# Patient Record
Sex: Female | Born: 1980
Health system: Northeastern US, Community
[De-identification: ages and names within clinical notes are randomized; demographics above are authoritative.]

## PROBLEM LIST (undated history)

## (undated) DIAGNOSIS — R7611 Nonspecific reaction to tuberculin skin test without active tuberculosis: Secondary | ICD-10-CM

## (undated) DIAGNOSIS — F4323 Adjustment disorder with mixed anxiety and depressed mood: Secondary | ICD-10-CM

## (undated) DIAGNOSIS — E669 Obesity, unspecified: Secondary | ICD-10-CM

## (undated) HISTORY — PX: MASTOPEXY: REP19

## (undated) HISTORY — DX: Generalized anxiety disorder: F41.1

## (undated) HISTORY — PX: EXCISION SKIN ABD INFRAUMBILICAL PANNICULECTOMY: PRO028

## (undated) HISTORY — DX: Nonspecific reaction to tuberculin skin test without active tuberculosis: R76.11

## (undated) HISTORY — PX: OB ANTEPARTUM CARE CESAREAN DLVR & POSTPARTUM: REP299

## (undated) HISTORY — PX: TUBAL LIGATION: SHX77

---

## 2006-04-12 ENCOUNTER — Emergency Department (HOSPITAL_BASED_OUTPATIENT_CLINIC_OR_DEPARTMENT_OTHER): Payer: Self-pay | Admitting: Emergency Medicine

## 2006-04-12 LAB — BLOOD COUNT COMPLETE AUTO&AUTO DIFRNTL WBC
BASOPHIL %: 0.5 % (ref 0.0–2.0)
EOSINOPHIL %: 2 % (ref 0.0–7.0)
HEMATOCRIT: 38.7 % (ref 36.0–48.0)
HEMOGLOBIN: 13.1 g/dl (ref 12.0–16.0)
LYMPHOCYTE %: 18.5 % (ref 13.0–39.0)
MEAN CORP HGB CONC: 33.8 g/dl (ref 32.0–36.0)
MEAN CORPUSCULAR HGB: 30.9 pg (ref 27.0–33.0)
MEAN CORPUSCULAR VOL: 91.4 fl (ref 80.0–100.0)
MEAN PLATELET VOLUME: 8.9 fl (ref 6.4–10.8)
MONOCYTE %: 4.8 % (ref 1.0–12.0)
NEUTROPHIL %: 74.2 % (ref 46.0–79.0)
PLATELET COUNT: 247 10*3/uL (ref 150–400)
RBC DISTRIBUTION WIDTH: 11.9 % (ref 11.5–14.3)
RED BLOOD CELL COUNT: 4.23 M/uL — ABNORMAL LOW (ref 4.50–5.10)
WHITE BLOOD CELL COUNT: 10.2 10*3/uL (ref 4.0–10.8)

## 2006-04-12 LAB — PATIENT ABO/RH CONFIRM

## 2006-04-12 LAB — US PREG UTERUS REAL TIME W/IMAGE DCMTN TRANSVAG

## 2006-04-12 LAB — HCG QUALITATIVE SERUM: HCG QUALITATIVE SERUM: POSITIVE — AB

## 2006-04-12 LAB — US OB 1ST TRIMESTER

## 2006-04-12 LAB — TYPE AND SCREEN

## 2006-04-16 LAB — EMERGENCY ROOM NOTE

## 2006-04-20 ENCOUNTER — Encounter (HOSPITAL_BASED_OUTPATIENT_CLINIC_OR_DEPARTMENT_OTHER): Payer: Self-pay | Admitting: Obstetrics & Gynecology

## 2006-04-20 ENCOUNTER — Ambulatory Visit (HOSPITAL_BASED_OUTPATIENT_CLINIC_OR_DEPARTMENT_OTHER): Payer: Medicaid Other | Admitting: Obstetrics & Gynecology

## 2006-04-20 VITALS — BP 110/70 | Wt 163.5 lb

## 2006-04-20 DIAGNOSIS — O039 Complete or unspecified spontaneous abortion without complication: Secondary | ICD-10-CM

## 2006-04-20 DIAGNOSIS — Z23 Encounter for immunization: Secondary | ICD-10-CM

## 2006-04-20 LAB — URINE DIP (POINT OF CARE)
BILIRUBIN, URINE: NEGATIVE mg/dl (ref 0–0)
GLUCOSE, URINE: NEGATIVE mg/dl (ref 0–0)
KETONE, URINE: NEGATIVE mg/dl (ref 0–0)
LEUKOCYTE ESTERASE: NEGATIVE Leu/mcl (ref 0–0)
NITRITE, URINE: NEGATIVE
OCCULT BLOOD, URINE: NEGATIVE mg/dl (ref 0–0)
PH URINE: 5.5 (ref 5.0–8.0)
PROTEIN, URINE: 15 mg/dl (ref 0–15)
SPECIFIC GRAVITY URINE: 1.03 (ref 1.003–1.030)
UROBILINOGEN URINE: NEGATIVE mg/dl (ref 0.2–1.0)

## 2006-04-20 NOTE — Progress Notes (Signed)
S/ referred from the ER after presenting with bleeding in early pregnancy and diagnosed with an IUP. No further bleding No C/O. Wants a BTL with this C/S. (H/O of 2 prior C/Ss.)       No Known Allergies.  No current outpatient prescriptions on file prior to 04/20/06.    Obstetric History   G3 P2 T2 P0 TAB0 SAB0 E0 M0 L2     Past Medical History:   ANXIETY STATE NEC    ASTHMA    Comment: peviously used a vaporizer  Past Surgical History:   CESAREAN DELIVERY    Comment: times two, Wants BTL with next C/S  Social History   Marital Status: Married Spouse Name:    Years of Education: Number of children:     Occupational History   None on file    Social History Main Topics   Tobacco Use: Never    Alcohol Use: No    Drug Use: No    Sexual Activity: Yes Partners with: Female   Comment: H/O abnormal pap after her last C/S, S/P    cauterization, normal since then. No H/O   STDs.    Other Topics Concern   None on file    Social History Narrative   None on file      Review of patient's family history indicates:   Diabetes Mother    Hypertension Mother     O/ FH heard by doptone    A/ IUP    P/ F/U for first prenatal visit.

## 2006-05-05 ENCOUNTER — Other Ambulatory Visit (HOSPITAL_BASED_OUTPATIENT_CLINIC_OR_DEPARTMENT_OTHER): Payer: Self-pay

## 2006-05-05 NOTE — Telephone Encounter (Signed)
Thank you for your referral to the [facility]. We will be contacting your patient within the next 24 hours to assist them in making an appointment. You will be informed of the outcome via your Epic InBasket.

## 2006-05-09 NOTE — Telephone Encounter (Signed)
Patient has appointmen scheduled with Dr. Neva Seat for March 28th.

## 2006-05-15 ENCOUNTER — Encounter (HOSPITAL_BASED_OUTPATIENT_CLINIC_OR_DEPARTMENT_OTHER): Payer: Self-pay | Admitting: Obstetrics & Gynecology

## 2006-05-15 ENCOUNTER — Ambulatory Visit (HOSPITAL_BASED_OUTPATIENT_CLINIC_OR_DEPARTMENT_OTHER): Payer: PRIVATE HEALTH INSURANCE | Admitting: Obstetrics & Gynecology

## 2006-05-15 VITALS — BP 142/90 | HR 92 | Wt 167.0 lb

## 2006-05-15 DIAGNOSIS — Z3480 Encounter for supervision of other normal pregnancy, unspecified trimester: Secondary | ICD-10-CM

## 2006-05-15 DIAGNOSIS — Z111 Encounter for screening for respiratory tuberculosis: Secondary | ICD-10-CM

## 2006-05-15 LAB — URINE DIP (POINT OF CARE)
BILIRUBIN, URINE: NEGATIVE mg/dl (ref 0–0)
KETONE, URINE: NEGATIVE mg/dl (ref 0–0)
LEUKOCYTE ESTERASE: NEGATIVE Leu/mcl (ref 0–0)
NITRITE, URINE: NEGATIVE
OCCULT BLOOD, URINE: NEGATIVE mg/dl (ref 0–0)
PH URINE: 6.5 (ref 5.0–8.0)
PROTEIN, URINE: NEGATIVE mg/dl (ref 0–15)
SPECIFIC GRAVITY URINE: 1.02 (ref 1.003–1.030)

## 2006-05-16 LAB — URINALYSIS DIPSTICK
BILIRUBIN, URINE: NEGATIVE
GLUCOSE, URINE: NEGATIVE MG/DL
KETONE, URINE: NEGATIVE MG/DL
LEUKOCYTE ESTERASE: NEGATIVE
NITRITE, URINE: NEGATIVE
OCCULT BLOOD, URINE: NEGATIVE
PH URINE: 5.5 (ref 5.0–8.0)
PROTEIN, URINE: NEGATIVE MG/DL
SPECIFIC GRAVITY URINE: 1.023 (ref 1.003–1.035)

## 2006-05-17 ENCOUNTER — Encounter (HOSPITAL_BASED_OUTPATIENT_CLINIC_OR_DEPARTMENT_OTHER): Payer: Self-pay | Admitting: Obstetrics & Gynecology

## 2006-05-17 DIAGNOSIS — R7611 Nonspecific reaction to tuberculin skin test without active tuberculosis: Secondary | ICD-10-CM

## 2006-05-17 HISTORY — DX: Nonspecific reaction to tuberculin skin test without active tuberculosis: R76.11

## 2006-05-17 LAB — SKIN TEST TUBERCULOSIS INTRADERMAL

## 2006-05-18 LAB — HGB ELECTROPHORESIS
HEMOGLOBIN A1A3: 95.3 % — ABNORMAL LOW (ref 96.0–99.5)
HEMOGLOBIN A2: 3.2 % (ref 2.0–3.5)
HEMOGLOBIN C: 0 % (ref 0–0)
HEMOGLOBIN F: 1.2 % (ref 0–2.0)
HEMOGLOBIN S: 0 % (ref 0–0)

## 2006-05-18 LAB — HEPATITIS C ANTIBODY: HEPATITIS C ANTIBODY: NEGATIVE

## 2006-05-18 LAB — RUBELLA IGG ANTIBODY: RUBELLA: 258 IU/mL — ABNORMAL HIGH (ref 0–15)

## 2006-05-18 LAB — TREPONEMA PALLIDUM AB IGG: TREPONEMA PALLIDUM AB IgG: NONREACTIVE

## 2006-05-18 LAB — CHLAMYDIA GC NAAT
GENPROBE CHLAMYDIA: NEGATIVE
GENPROBE GC: NEGATIVE

## 2006-05-18 LAB — VARICELLA ZOSTER IGG ANTIBODY: VARICELLA ZOSTER IGG ANTIBODY: 2.5 — ABNORMAL HIGH (ref 0.0–0.8)

## 2006-05-18 LAB — THYROID SCREEN TSH REFLEX FT4: THYROID SCREEN TSH REFLEX FT4: 0.75 u[IU]/mL (ref 0.34–5.60)

## 2006-05-18 LAB — HIV 1 AND 2 PLUS O ANTIBODY: HIV 1 AND 2 PLUS O SCREEN: NONREACTIVE

## 2006-05-18 LAB — HEPATITIS B SURFACE ANTIGEN: HEPATITIS B SURFACE ANTIGEN: NONREACTIVE

## 2006-05-22 LAB — CYSTIC FIBROSIS DNA ANALYSIS: CYSTIC FIBROSIS DNA ANALYSIS: NEGATIVE

## 2006-05-26 ENCOUNTER — Ambulatory Visit (HOSPITAL_BASED_OUTPATIENT_CLINIC_OR_DEPARTMENT_OTHER): Payer: PRIVATE HEALTH INSURANCE

## 2006-05-26 DIAGNOSIS — F4323 Adjustment disorder with mixed anxiety and depressed mood: Secondary | ICD-10-CM

## 2006-05-26 HISTORY — DX: Adjustment disorder with mixed anxiety and depressed mood: F43.23

## 2006-05-26 LAB — CYTOPATH, C/V, THIN LAYER

## 2006-05-26 MED ORDER — AMITRIPTYLINE HCL 25 MG PO TABS
ORAL_TABLET | ORAL | Status: DC
Start: 2006-05-26 — End: 2006-08-04
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
## 2006-05-31 ENCOUNTER — Encounter (HOSPITAL_BASED_OUTPATIENT_CLINIC_OR_DEPARTMENT_OTHER): Payer: PRIVATE HEALTH INSURANCE | Admitting: Obstetrics & Gynecology

## 2006-06-05 ENCOUNTER — Ambulatory Visit (HOSPITAL_BASED_OUTPATIENT_CLINIC_OR_DEPARTMENT_OTHER): Payer: Self-pay | Admitting: Obstetrics & Gynecology

## 2006-06-05 DIAGNOSIS — Z3480 Encounter for supervision of other normal pregnancy, unspecified trimester: Secondary | ICD-10-CM

## 2006-06-20 ENCOUNTER — Emergency Department (HOSPITAL_BASED_OUTPATIENT_CLINIC_OR_DEPARTMENT_OTHER): Payer: Self-pay | Admitting: Emergency Medicine

## 2006-06-21 LAB — COMPREHENSIVE METABOLIC PANEL
ALANINE AMINOTRANSFERASE: 11 IU/L (ref 7–35)
ALBUMIN: 2.8 g/dl — ABNORMAL LOW (ref 3.4–4.8)
ALKALINE PHOSPHATASE: 48 IU/L (ref 25–106)
ANION GAP: 5 mmol/L (ref 2–25)
ASPARTATE AMINOTRANSFERASE: 16 IU/L (ref 8–34)
BILIRUBIN TOTAL: 0.4 mg/dl (ref 0.2–1.1)
BUN (UREA NITROGEN): 3 mg/dl — ABNORMAL LOW (ref 6–20)
CALCIUM: 8.3 mg/dl — ABNORMAL LOW (ref 8.6–10.0)
CARBON DIOXIDE: 25 mmol/L (ref 22–32)
CHLORIDE: 108 mmol/L (ref 101–111)
CREATININE: 0.6 mg/dl (ref 0.4–1.2)
Glucose Random: 110 mg/dl (ref 74–160)
POTASSIUM: 3.8 mmol/L (ref 3.5–5.1)
SODIUM: 138 mmol/L (ref 135–144)
TOTAL PROTEIN: 5.7 g/dl — ABNORMAL LOW (ref 5.9–7.5)

## 2006-06-21 LAB — BLOOD COUNT COMPLETE AUTO&AUTO DIFRNTL WBC
BASOPHIL %: 0.4 % (ref 0.0–2.0)
EOSINOPHIL %: 1.4 % (ref 0.0–7.0)
HEMATOCRIT: 30.7 % — ABNORMAL LOW (ref 36.0–48.0)
HEMOGLOBIN: 10.8 g/dl — ABNORMAL LOW (ref 12.0–16.0)
LYMPHOCYTE %: 14.1 % (ref 13.0–39.0)
MEAN CORP HGB CONC: 35.1 g/dl (ref 32.0–36.0)
MEAN CORPUSCULAR HGB: 31 pg (ref 27.0–33.0)
MEAN CORPUSCULAR VOL: 88.3 fl (ref 80.0–100.0)
MEAN PLATELET VOLUME: 9 fl (ref 6.4–10.8)
MONOCYTE %: 4.6 % (ref 1.0–12.0)
NEUTROPHIL %: 79.5 % — ABNORMAL HIGH (ref 46.0–79.0)
PLATELET COUNT: 218 10*3/uL (ref 150–400)
RBC DISTRIBUTION WIDTH: 11.8 % (ref 11.5–14.3)
RED BLOOD CELL COUNT: 3.48 M/uL — ABNORMAL LOW (ref 4.50–5.10)
WHITE BLOOD CELL COUNT: 11.8 10*3/uL — ABNORMAL HIGH (ref 4.0–10.8)

## 2006-06-21 LAB — HOLD GREEN TOP TUBE

## 2006-06-21 LAB — EMERGENCY ROOM NOTE

## 2006-06-21 LAB — HOLD BLUE TOP TUBE

## 2006-06-23 ENCOUNTER — Encounter (HOSPITAL_BASED_OUTPATIENT_CLINIC_OR_DEPARTMENT_OTHER): Payer: Medicaid Other

## 2006-06-26 ENCOUNTER — Encounter (HOSPITAL_BASED_OUTPATIENT_CLINIC_OR_DEPARTMENT_OTHER): Payer: Self-pay | Admitting: "Women's Health Care

## 2006-06-26 NOTE — Progress Notes (Signed)
Pt walk in s/p er visit for cough  and sob on 4/22. Pt c/o SOB again.  Pt sent to ER for evaluation. Pt given phone Number for PCP, encouragedpt to schedule an appointment.   Appt scheduled to see Dr Mindi Junker this WED.  GA 20+ wks, pt missed last prenatal appt.

## 2006-06-28 ENCOUNTER — Encounter (HOSPITAL_BASED_OUTPATIENT_CLINIC_OR_DEPARTMENT_OTHER): Payer: PRIVATE HEALTH INSURANCE | Admitting: Obstetrics & Gynecology

## 2006-07-05 ENCOUNTER — Encounter (HOSPITAL_BASED_OUTPATIENT_CLINIC_OR_DEPARTMENT_OTHER): Payer: Medicaid Other | Admitting: Registered"

## 2006-07-06 ENCOUNTER — Encounter (HOSPITAL_BASED_OUTPATIENT_CLINIC_OR_DEPARTMENT_OTHER): Payer: Medicaid Other | Admitting: Geriatric Medicine

## 2006-07-10 ENCOUNTER — Ambulatory Visit (HOSPITAL_BASED_OUTPATIENT_CLINIC_OR_DEPARTMENT_OTHER): Payer: Self-pay | Admitting: Obstetrics & Gynecology

## 2006-07-10 ENCOUNTER — Encounter (HOSPITAL_BASED_OUTPATIENT_CLINIC_OR_DEPARTMENT_OTHER): Payer: PRIVATE HEALTH INSURANCE

## 2006-07-10 DIAGNOSIS — R296 Repeated falls: Secondary | ICD-10-CM

## 2006-07-10 DIAGNOSIS — O99891 Other specified diseases and conditions complicating pregnancy: Secondary | ICD-10-CM

## 2006-07-10 DIAGNOSIS — O9989 Other specified diseases and conditions complicating pregnancy, childbirth and the puerperium: Principal | ICD-10-CM

## 2006-07-10 LAB — BLOOD COUNT COMPLETE AUTOMATED
HEMATOCRIT: 33.7 % — ABNORMAL LOW (ref 36.0–48.0)
HEMOGLOBIN: 11.3 g/dl — ABNORMAL LOW (ref 12.0–16.0)
MEAN CORP HGB CONC: 33.6 g/dl (ref 32.0–36.0)
MEAN CORPUSCULAR HGB: 30.9 pg (ref 27.0–33.0)
MEAN CORPUSCULAR VOL: 92 fl (ref 80.0–100.0)
MEAN PLATELET VOLUME: 9.2 fl (ref 6.4–10.8)
PLATELET COUNT: 224 10*3/uL (ref 150–400)
RBC DISTRIBUTION WIDTH: 12.7 % (ref 11.5–14.3)
RED BLOOD CELL COUNT: 3.66 M/uL — ABNORMAL LOW (ref 4.50–5.10)
WHITE BLOOD CELL COUNT: 11 10*3/uL — ABNORMAL HIGH (ref 4.0–10.8)

## 2006-07-10 LAB — CHG CREATININE BLOOD: CREATININE: 0.5 mg/dl (ref 0.4–1.2)

## 2006-07-10 LAB — CHG ASSAY OF BLOOD/URIC ACID: URIC ACID: 3.3 mg/dl (ref 2.6–8.0)

## 2006-07-10 LAB — TRANSFERASE ASPARTATE AMINO AST SGOT: ASPARTATE AMINOTRANSFERASE: 15 IU/L (ref 8–34)

## 2006-07-10 LAB — TRANSFERASE ALANINE AMINO ALT SGPT: ALANINE AMINOTRANSFERASE: 11 IU/L (ref 7–35)

## 2006-07-10 LAB — ASSAY OF UREA NITROGEN QUANTITATIVE: BUN (UREA NITROGEN): 6 mg/dl (ref 6–20)

## 2006-07-12 LAB — US OB 2ND OR 3RD TRIMESTER SURVEY

## 2006-07-20 ENCOUNTER — Encounter (HOSPITAL_BASED_OUTPATIENT_CLINIC_OR_DEPARTMENT_OTHER): Payer: Self-pay | Admitting: Obstetrics & Gynecology

## 2006-07-20 ENCOUNTER — Ambulatory Visit (HOSPITAL_BASED_OUTPATIENT_CLINIC_OR_DEPARTMENT_OTHER): Payer: PRIVATE HEALTH INSURANCE | Admitting: Obstetrics & Gynecology

## 2006-07-20 VITALS — BP 124/66 | Wt 175.0 lb

## 2006-07-20 DIAGNOSIS — Z8759 Personal history of other complications of pregnancy, childbirth and the puerperium: Secondary | ICD-10-CM | POA: Insufficient documentation

## 2006-07-20 DIAGNOSIS — Z3480 Encounter for supervision of other normal pregnancy, unspecified trimester: Secondary | ICD-10-CM

## 2006-07-20 LAB — URINE DIP (POINT OF CARE)
BILIRUBIN, URINE: NEGATIVE mg/dl (ref 0–0)
GLUCOSE, URINE: NORMAL mg/dl (ref 0–0)
KETONE, URINE: NEGATIVE mg/dl (ref 0–0)
LEUKOCYTE ESTERASE: NEGATIVE Leu/mcl (ref 0–0)
NITRITE, URINE: NEGATIVE
OCCULT BLOOD, URINE: NEGATIVE mg/dl (ref 0–0)
PH URINE: 6 (ref 5.0–8.0)
PROTEIN, URINE: NEGATIVE mg/dl (ref 0–15)
SPECIFIC GRAVITY URINE: 1.02 (ref 1.003–1.030)
UROBILINOGEN URINE: NORMAL mg/dl (ref 0.2–1.0)

## 2006-07-20 MED ORDER — SALINE NASAL SPRAY 0.65 % NA SOLN
NASAL | Status: DC
Start: 2006-07-20 — End: 2006-10-12

## 2006-07-25 ENCOUNTER — Ambulatory Visit (HOSPITAL_BASED_OUTPATIENT_CLINIC_OR_DEPARTMENT_OTHER): Payer: PRIVATE HEALTH INSURANCE

## 2006-07-25 ENCOUNTER — Encounter (HOSPITAL_BASED_OUTPATIENT_CLINIC_OR_DEPARTMENT_OTHER): Payer: Self-pay

## 2006-07-25 DIAGNOSIS — Z7189 Other specified counseling: Secondary | ICD-10-CM

## 2006-07-25 NOTE — Progress Notes (Signed)
Kimberly Lindsey is a 26 year old female, G2 P2. Non smoker patient , happily married, denies any type of violence.    Patient here today requesting counseling on sterilization. Tubal ligation counseling was done, information was given and consent signed.    Patient is currently [redacted] weeks pregnant, due for delivery by C-Section on 10/2006 and would like to have surgery on the same day of delivery.    Denies any other problems.

## 2006-08-01 ENCOUNTER — Ambulatory Visit (HOSPITAL_BASED_OUTPATIENT_CLINIC_OR_DEPARTMENT_OTHER): Payer: Medicaid Other | Admitting: Neurology

## 2006-08-01 ENCOUNTER — Ambulatory Visit (HOSPITAL_BASED_OUTPATIENT_CLINIC_OR_DEPARTMENT_OTHER): Payer: Medicaid Other

## 2006-08-01 VITALS — BP 118/82

## 2006-08-01 DIAGNOSIS — Z3480 Encounter for supervision of other normal pregnancy, unspecified trimester: Secondary | ICD-10-CM

## 2006-08-01 NOTE — Progress Notes (Signed)
Due to the language barrier, the office visit was conducted in [place] with an interpreter. The interpreter's name is Rosanna. The interpreter was on the phone during the entire visit.  Walked in with concerns about episode that occurred two hours ago. 1:30 felt c/o fatigue, weakness with numbness around her mouth, while talking with friends at hairdressers and needed asked for help sitting down. Sensation was fleeting and not present now. Had a glass of water and put her feet up and felt better right away. She called her husband to bring her to her appt. at 4:20 and came here first.    BP 118/82 Skin warm, dry and color pink. Denied racing heart beat. Fetus moving well, pt. denies contractions, leaking or vaginal discharge. Stated that she feels fine now.    Pt. activity today, normal, walked about 20 minutes in the morning.  12 noon ate beans,rice, meat & vegetables; Drank at least two bottles of water.    Pt. has an appt. today with Neuro for headaches. Pt. has a consult with Dr. Matilde Haymaker (Cardiology) on 06/10 for history in [place] of racing heart sensation.    Report to Dr. Neil Crouch. Pt. to keep her appt. with Neuro and instructed to call or return to the hospital for any further return of symptoms.

## 2006-08-04 ENCOUNTER — Ambulatory Visit (HOSPITAL_BASED_OUTPATIENT_CLINIC_OR_DEPARTMENT_OTHER): Payer: PRIVATE HEALTH INSURANCE

## 2006-08-04 DIAGNOSIS — F4323 Adjustment disorder with mixed anxiety and depressed mood: Secondary | ICD-10-CM

## 2006-08-04 MED ORDER — AMITRIPTYLINE HCL 25 MG PO TABS
ORAL_TABLET | ORAL | Status: AC
Start: 2006-08-04 — End: 2006-11-02

## 2006-08-04 NOTE — Progress Notes (Signed)
PSYCHIATRY OUTPATIENT PROGRESS NOTE    VISIT TYPE: Psychopharmacology      INTERPRETER: Synetta Fail (name) via phone interpreter in [place].    PROBLEMS which this visit addressed:   Problem 1: depressive sx     SOURCE(S) OF INFORMATION: Patient and pt's husband     SUBJECTIVE FINDINGS:     Kimberly Lindsey presents for f/u today with her husband; now 6 months pregnant. Since starting amitriptyline, sleep and mood have improved. She feels much calmer, and is not crying frequently. She denies any side effects from the medication.       OBJECTIVE FINDINGS:   Pertinent positive and negative parts of mental status exam:     - casually dressed [place] F with consistent eye contact  - no abnl movements  - speech nl rate and tone  - TP linear, logical  - TC no evidence of delusions; no IOR, no obsessions/compulsions  - mood "more tranquil, not so depressed"  - affect reactive, congruent  - no A/VH  - no SI no HI; future oriented  - insight/judgement- fair/good  - cognition- grossly intact     Signs and symptoms: as above     Current medications (n/a for psychotherapy only visits):   Current outpatient prescriptions:  AMITRIPTYLINE HCL 25 MG OR TABS, 1 TABLET AT BEDTIME,   SALINE NASAL SPRAY 0.65 % NA SOLN, Q 3 hours nasally PRN, Disp: one, Rfl: 3  PRENATAL VITAMINS (DIS) OR TABS, one a day, Disp: , Rfl: 0    Medications taken as prescribed (n/a for psychotherapy only visits): Yes    Medication side effects - including movement disorders/AIMS score (n/a for psychotherapy only visits): None reported.     Testing results: No test results pending.     Risk behaviors: None reported.       ASSESSMENT:  Clinical formulation: 26 yo remarried [place] mother of two, now pregnant w/her third child referred by Dr. Raylene Miyamoto in the setting of depressive sx. Pt has numerous psychosocial stressors, and limited social supports. She has a h/o depressive sx; and has responded well to medication tx. We have recommended both supportive therapy and  medication mgt, however Ms. Capp prefers medication tx alone at this time. Discussed possible therapeutic benefits of TCA trial to address insomnia and depressive sx. Safety profile in pregnancy discussed.           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 Amitriptyline 25mg  po qhs; discussed safety profile in pregnancy and lactation (pt handout provided)    - pt informed that I will be on maternity leave for three months, starting August; prescriber coverage through [facility] will be provided    - contact w/Dr. Raylene Miyamoto to coordinate care     Risk plan (for patients at moderate/high risk for suicide/violence/addiction): Patient not at moderate or high risk.    Next visit: patient to be seen as needed  FOR PSYCHOPHARMACOLOGY VISITS ONLY  Pregnancy status: as above    Medication plan:   Continue current medications without change     Medication education: as above    Medical work-up plan/testing: None indicated.    Instructions to covering prescriber: OK to re-fill if patient needs meds     Amount of time spent w/patient today: 20"     Ed Blalock

## 2006-08-07 ENCOUNTER — Encounter (HOSPITAL_BASED_OUTPATIENT_CLINIC_OR_DEPARTMENT_OTHER): Payer: Self-pay | Admitting: "Women's Health Care

## 2006-08-07 DIAGNOSIS — R7611 Nonspecific reaction to tuberculin skin test without active tuberculosis: Secondary | ICD-10-CM | POA: Insufficient documentation

## 2006-08-07 LAB — MED SPEC CLINIC NOTE

## 2006-08-10 ENCOUNTER — Ambulatory Visit (HOSPITAL_BASED_OUTPATIENT_CLINIC_OR_DEPARTMENT_OTHER): Payer: Self-pay | Admitting: Obstetrics & Gynecology

## 2006-08-10 DIAGNOSIS — R1084 Generalized abdominal pain: Secondary | ICD-10-CM

## 2006-08-10 LAB — URINALYSIS
BILIRUBIN, URINE: NEGATIVE
GLUCOSE, URINE: NEGATIVE MG/DL
KETONE, URINE: NEGATIVE MG/DL
LEUKOCYTE ESTERASE: NEGATIVE
NITRITE, URINE: NEGATIVE
OCCULT BLOOD, URINE: NEGATIVE
PH URINE: 6.5 (ref 5.0–8.0)
PROTEIN, URINE: NEGATIVE MG/DL
SPECIFIC GRAVITY URINE: 1.02 (ref 1.003–1.035)

## 2006-08-10 LAB — FETAL FIBRONECTIN: FETAL FIBRONECTIN: NEGATIVE

## 2006-08-15 LAB — URINE CULTURE/COLONY COUNT

## 2006-08-17 ENCOUNTER — Telehealth (HOSPITAL_BASED_OUTPATIENT_CLINIC_OR_DEPARTMENT_OTHER): Payer: Self-pay | Admitting: Obstetrics & Gynecology

## 2006-08-17 ENCOUNTER — Ambulatory Visit (HOSPITAL_BASED_OUTPATIENT_CLINIC_OR_DEPARTMENT_OTHER): Payer: PRIVATE HEALTH INSURANCE | Admitting: Obstetrics & Gynecology

## 2006-08-17 NOTE — Telephone Encounter (Signed)
Telephone call to pt message left,  please call back [facility] at [phone].

## 2006-08-17 NOTE — Telephone Encounter (Signed)
This patient [facility] for her 28 week prenatal appointment. Please track her down. Thanks.

## 2006-08-18 NOTE — Telephone Encounter (Signed)
AM

## 2006-09-01 NOTE — Telephone Encounter (Signed)
Please continue to call this patient every day until you reach her.

## 2006-09-04 NOTE — Telephone Encounter (Signed)
All phone # out of service. Cert letter sent

## 2006-09-05 ENCOUNTER — Encounter (HOSPITAL_BASED_OUTPATIENT_CLINIC_OR_DEPARTMENT_OTHER): Payer: Self-pay | Admitting: Obstetrics & Gynecology

## 2006-09-11 ENCOUNTER — Ambulatory Visit (HOSPITAL_BASED_OUTPATIENT_CLINIC_OR_DEPARTMENT_OTHER): Payer: PRIVATE HEALTH INSURANCE | Admitting: Obstetrics & Gynecology

## 2006-09-11 VITALS — BP 128/72 | Wt 178.0 lb

## 2006-09-11 DIAGNOSIS — Z3480 Encounter for supervision of other normal pregnancy, unspecified trimester: Secondary | ICD-10-CM

## 2006-09-11 LAB — BLOOD COUNT COMPLETE AUTOMATED
HEMATOCRIT: 32 % — ABNORMAL LOW (ref 36.0–48.0)
HEMOGLOBIN: 10.7 g/dl — ABNORMAL LOW (ref 12.0–16.0)
MEAN CORP HGB CONC: 33.4 g/dl (ref 32.0–36.0)
MEAN CORPUSCULAR HGB: 29.8 pg (ref 27.0–33.0)
MEAN CORPUSCULAR VOL: 89.2 fl (ref 80.0–100.0)
MEAN PLATELET VOLUME: 9.6 fl (ref 6.4–10.8)
PLATELET COUNT: 203 10*3/uL (ref 150–400)
RBC DISTRIBUTION WIDTH: 13.1 % (ref 11.5–14.3)
RED BLOOD CELL COUNT: 3.59 M/uL — ABNORMAL LOW (ref 4.50–5.10)
WHITE BLOOD CELL COUNT: 12.4 10*3/uL — ABNORMAL HIGH (ref 4.0–10.8)

## 2006-09-11 LAB — URINE DIP (POINT OF CARE)
BILIRUBIN, URINE: NEGATIVE mg/dl (ref 0–0)
GLUCOSE, URINE: NEGATIVE mg/dl (ref 0–0)
KETONE, URINE: NEGATIVE mg/dl (ref 0–0)
NITRITE, URINE: NEGATIVE
PH URINE: 6.5 (ref 5.0–8.0)
PROTEIN, URINE: NEGATIVE mg/dl (ref 0–15)
SPECIFIC GRAVITY URINE: 1.025 (ref 1.003–1.030)
UROBILINOGEN URINE: 0.2 mg/dl (ref 0.2–1.0)

## 2006-09-11 LAB — GLUCOSE 1 HR POST 50 GRAM DOSE: GLUCOSE 1 HR POST 50 GRAM DOSE: 107 mg/dl (ref ?–139)

## 2006-09-11 NOTE — Progress Notes (Signed)
Pt. walked in with concerns about hands swelling and numbness. Pt. stated that she had been to L&D on the "5th" floor a month ago; 06/19 [facility] with Dr. Raylene Miyamoto on 08/17/06--phone number non-functioning. Regular and certified letters sent to her home--pt. was on [employer] helping her sister with a new baby.  Pt. complains of:   -pain level of 4 in perianal/vaginal "like the bone hurting." With right side lying and pillow between knees, the sensation decreased to a pain level of 2.   -rare uterine tightening (perhaps 3 times a week)--braxton hicks discussed.   -weekly headaches (appx. one a week) relieved by tylenol.  -Missed 28 week visit; 50 gm glucose challenge today + CBCP-pt. is O+ blood type  -Urine 1.025 S.G. and encouraged pt. to increase fluids.  Discussed with Dr. Ellwood Handler and pt. placed onto fetal monitor.

## 2006-09-11 NOTE — Progress Notes (Signed)
Reassuring NST  [email or web address]

## 2006-09-12 DIAGNOSIS — Z8759 Personal history of other complications of pregnancy, childbirth and the puerperium: Secondary | ICD-10-CM | POA: Insufficient documentation

## 2006-09-14 ENCOUNTER — Ambulatory Visit (HOSPITAL_BASED_OUTPATIENT_CLINIC_OR_DEPARTMENT_OTHER): Payer: PRIVATE HEALTH INSURANCE | Admitting: Obstetrics & Gynecology

## 2006-09-14 VITALS — BP 120/66 | Wt 180.0 lb

## 2006-09-14 DIAGNOSIS — Z3480 Encounter for supervision of other normal pregnancy, unspecified trimester: Secondary | ICD-10-CM

## 2006-09-14 LAB — URINE DIP (POINT OF CARE)
BILIRUBIN, URINE: NEGATIVE mg/dl (ref 0–0)
GLUCOSE, URINE: NEGATIVE mg/dl (ref 0–0)
KETONE, URINE: NEGATIVE mg/dl (ref 0–0)
NITRITE, URINE: NEGATIVE
OCCULT BLOOD, URINE: NEGATIVE mg/dl (ref 0–0)
PH URINE: 6.5 (ref 5.0–8.0)
PROTEIN, URINE: NEGATIVE mg/dl (ref 0–15)
SPECIFIC GRAVITY URINE: 1.02 (ref 1.003–1.030)
UROBILINOGEN URINE: 0.2 mg/dl (ref 0.2–1.0)

## 2006-09-14 LAB — PROTEIN 24 HOUR URINE
PROTEIN 24HR URINE MG/DL: 15 mg/dl
PROTEIN 24HR URINE: 143 mg/24HR — ABNORMAL HIGH (ref 50–100)
TOTAL VOLUME 24 HOUR URINE: 950 ml/24HR (ref 600–1600)

## 2006-09-14 LAB — XR CHEST 1 VIEW

## 2006-09-14 MED ORDER — ACETAMINOPHEN-CAFF-BUTALBITAL 325-40-50 MG OR TABS
ORAL_TABLET | ORAL | Status: AC
Start: 2006-09-14 — End: 2006-10-14

## 2006-09-14 MED ORDER — FERROUS SULFATE 325 (65 FE) MG PO TABS
ORAL_TABLET | ORAL | Status: AC
Start: 2006-09-14 — End: 2007-09-14

## 2006-09-14 MED ORDER — PRENATAL VITAMINS (DIS) PO TABS
ORAL_TABLET | ORAL | Status: AC
Start: 2006-09-14 — End: 2007-09-14

## 2006-09-14 NOTE — Progress Notes (Signed)
Due to the language barrier, the office visit was conducted in Portuguese with an interpreter. The interpreter's name is . The interpreter was present during the entire visit.

## 2006-09-28 ENCOUNTER — Ambulatory Visit (HOSPITAL_BASED_OUTPATIENT_CLINIC_OR_DEPARTMENT_OTHER): Payer: PRIVATE HEALTH INSURANCE | Admitting: Obstetrics & Gynecology

## 2006-09-28 VITALS — BP 100/74 | Wt 185.0 lb

## 2006-09-28 DIAGNOSIS — Z3480 Encounter for supervision of other normal pregnancy, unspecified trimester: Secondary | ICD-10-CM

## 2006-09-28 LAB — URINE DIP (POINT OF CARE)
BILIRUBIN, URINE: NEGATIVE (ref 0–0)
GLUCOSE, URINE: NEGATIVE mg/dl (ref 0–0)
KETONE, URINE: NEGATIVE mg/dl (ref 0–0)
LEUKOCYTE ESTERASE: NEGATIVE (ref 0–0)
NITRITE, URINE: NEGATIVE
OCCULT BLOOD, URINE: NEGATIVE (ref 0–0)
PH URINE: 6.5 (ref 5.0–8.0)
PROTEIN, URINE: NEGATIVE mg/dl (ref 0–15)
SPECIFIC GRAVITY URINE: 1.015 (ref 1.003–1.030)
UROBILINOGEN URINE: 0.2 mg/dl (ref 0.2–1.0)

## 2006-09-28 NOTE — Progress Notes (Signed)
Due to the language barrier, the office visit was conducted in Portuguese with an interpreter. The interpreter's name is . The interpreter was present during the entire visit.

## 2006-09-29 NOTE — Progress Notes (Addendum)
Agree with plan  [email or web address]

## 2006-10-12 ENCOUNTER — Ambulatory Visit (HOSPITAL_BASED_OUTPATIENT_CLINIC_OR_DEPARTMENT_OTHER): Payer: PRIVATE HEALTH INSURANCE | Admitting: Obstetrics & Gynecology

## 2006-10-12 VITALS — BP 110/70 | Wt 183.0 lb

## 2006-10-12 DIAGNOSIS — Z3480 Encounter for supervision of other normal pregnancy, unspecified trimester: Secondary | ICD-10-CM

## 2006-10-12 LAB — URINE DIP (POINT OF CARE)
BILIRUBIN, URINE: NEGATIVE (ref 0–0)
GLUCOSE, URINE: NEGATIVE mg/dl (ref 0–0)
KETONE, URINE: NEGATIVE mg/dl (ref 0–0)
NITRITE, URINE: NEGATIVE
PH URINE: 6.5 (ref 5.0–8.0)
PROTEIN, URINE: NEGATIVE mg/dl (ref 0–15)
SPECIFIC GRAVITY URINE: 1.02 (ref 1.003–1.030)
UROBILINOGEN URINE: 0.2 mg/dl (ref 0.2–1.0)

## 2006-10-12 LAB — BLOOD SUGAR FINGERSTICK (POINT OF CARE): FINGERSTICK GLUCOSE: 142 mg/dl (ref 74–160)

## 2006-10-15 LAB — GENITAL GROUP B STREP: GENITAL GROUP B STREP: NEGATIVE

## 2006-10-17 ENCOUNTER — Telehealth (HOSPITAL_BASED_OUTPATIENT_CLINIC_OR_DEPARTMENT_OTHER): Payer: Self-pay | Admitting: "Women's Health Care

## 2006-10-17 NOTE — Telephone Encounter (Signed)
Due to the language barrier, the phone call was conducted in [place] with an interpreter. The interpreter was on the phone during the entire phone call.  States she has back pain that goes to her belly 1x/hr.  C/S booked 9/4.  Denies leaking vaginal fluid and states good FM.  Discussed with Dr Ellwood Handler.  Pt instructed to lie on left side, drinl plenty of liguids and to call with 4 or more pains in 1hr, < FM, leaking of any vaginal fluid.  Pt has no further appts scheduled.   Appt scheduled for 8/21 at 3pm.

## 2006-10-17 NOTE — Telephone Encounter (Signed)
Staff Message copied by Fuller Plan on Tue Oct 17, 2006 12:01 PM  ------   Message from: Danella Sensing   Created: Tue Oct 17, 2006 11:54 AM    Blenda Nicely [id number], 26 year old, female, Telephone Information:  Home Phone [phone]  Work Phone [phone]      Cleotis Lema NUMBER: [phone]  Cell phone:   Other phone:    Available times:    Patient's language of care: [place]    Patient needs a [place] interpreter.    Patient's PCP: None    Person calling on behalf of patient: patient (self)    Calls today patient will have cs 11/02/06 patient is now having for the last 4 days cramping   With hard belly that is contraction patient known's because is her third baby

## 2006-10-19 ENCOUNTER — Ambulatory Visit (HOSPITAL_BASED_OUTPATIENT_CLINIC_OR_DEPARTMENT_OTHER): Payer: PRIVATE HEALTH INSURANCE | Admitting: Obstetrics & Gynecology

## 2006-10-20 ENCOUNTER — Ambulatory Visit (HOSPITAL_BASED_OUTPATIENT_CLINIC_OR_DEPARTMENT_OTHER): Payer: Self-pay | Admitting: Obstetrics & Gynecology

## 2006-10-25 ENCOUNTER — Ambulatory Visit (HOSPITAL_BASED_OUTPATIENT_CLINIC_OR_DEPARTMENT_OTHER): Payer: Self-pay

## 2006-10-26 ENCOUNTER — Ambulatory Visit (HOSPITAL_BASED_OUTPATIENT_CLINIC_OR_DEPARTMENT_OTHER): Payer: PRIVATE HEALTH INSURANCE | Admitting: Obstetrics & Gynecology

## 2006-10-26 VITALS — BP 120/84 | Wt 186.5 lb

## 2006-10-26 DIAGNOSIS — Z3480 Encounter for supervision of other normal pregnancy, unspecified trimester: Secondary | ICD-10-CM

## 2006-10-26 LAB — URINE DIP (POINT OF CARE)
BILIRUBIN, URINE: NEGATIVE (ref 0–0)
GLUCOSE, URINE: NEGATIVE mg/dl (ref 0–0)
LEUKOCYTE ESTERASE: NEGATIVE (ref 0–0)
NITRITE, URINE: NEGATIVE
PH URINE: 6.5 (ref 5.0–8.0)
PROTEIN, URINE: NEGATIVE mg/dl (ref 0–15)
SPECIFIC GRAVITY URINE: 1.025 (ref 1.003–1.030)
UROBILINOGEN URINE: 0.2 mg/dl (ref 0.2–1.0)

## 2006-10-26 NOTE — Progress Notes (Signed)
Due to the language barrier, the office visit was conducted in Portuguese with an interpreter. The interpreter's name is . The interpreter was present during the entire visit.

## 2006-11-01 ENCOUNTER — Ambulatory Visit: Payer: Self-pay

## 2006-11-01 DIAGNOSIS — R002 Palpitations: Secondary | ICD-10-CM

## 2006-11-01 LAB — BLOOD COUNT COMPLETE AUTOMATED
HEMATOCRIT: 35 % — ABNORMAL LOW (ref 36.0–48.0)
HEMOGLOBIN: 11.6 g/dl — ABNORMAL LOW (ref 12.0–16.0)
MEAN CORP HGB CONC: 33.1 g/dl (ref 32.0–36.0)
MEAN CORPUSCULAR HGB: 29.6 pg (ref 27.0–33.0)
MEAN CORPUSCULAR VOL: 89.5 fl (ref 80.0–100.0)
MEAN PLATELET VOLUME: 9.6 fl (ref 6.4–10.8)
PLATELET COUNT: 172 10*3/uL (ref 150–400)
RBC DISTRIBUTION WIDTH: 16.5 % — ABNORMAL HIGH (ref 11.5–14.3)
RED BLOOD CELL COUNT: 3.91 M/uL — ABNORMAL LOW (ref 4.50–5.10)
WHITE BLOOD CELL COUNT: 10.9 10*3/uL — ABNORMAL HIGH (ref 4.0–10.8)

## 2006-11-01 LAB — TYPE AND SCREEN

## 2006-11-02 ENCOUNTER — Inpatient Hospital Stay (HOSPITAL_BASED_OUTPATIENT_CLINIC_OR_DEPARTMENT_OTHER)
Admit: 2006-11-02 | Disposition: A | Discharge status: Home / Self Care | Payer: Self-pay | Source: Ambulatory Visit | Attending: Obstetrics & Gynecology | Admitting: Obstetrics & Gynecology

## 2006-11-02 LAB — OPERATIVE REPORT

## 2006-11-02 LAB — TREPONEMA PALLIDUM AB IGG: TREPONEMA PALLIDUM AB IgG: NONREACTIVE

## 2006-11-03 LAB — BLOOD COUNT COMPLETE AUTOMATED
HEMATOCRIT: 24.4 % — ABNORMAL LOW (ref 36.0–48.0)
HEMOGLOBIN: 8 g/dl — ABNORMAL LOW (ref 12.0–16.0)
MEAN CORP HGB CONC: 33 g/dl (ref 32.0–36.0)
MEAN CORPUSCULAR HGB: 29.6 pg (ref 27.0–33.0)
MEAN CORPUSCULAR VOL: 89.7 fl (ref 80.0–100.0)
MEAN PLATELET VOLUME: 10.1 fl (ref 6.4–10.8)
PLATELET COUNT: 136 10*3/uL — ABNORMAL LOW (ref 150–400)
RBC DISTRIBUTION WIDTH: 16.4 % — ABNORMAL HIGH (ref 11.5–14.3)
RED BLOOD CELL COUNT: 2.72 M/uL — CL (ref 4.50–5.10)
WHITE BLOOD CELL COUNT: 9.7 10*3/uL (ref 4.0–10.8)

## 2006-11-03 LAB — SURGICAL PATH SPECIMEN

## 2006-11-07 LAB — DISCHARGE SUMMARY

## 2006-11-07 LAB — ECHOCARDIOGRAM W/ DOPPLER

## 2006-11-13 ENCOUNTER — Encounter (HOSPITAL_BASED_OUTPATIENT_CLINIC_OR_DEPARTMENT_OTHER): Payer: Self-pay | Admitting: Obstetrics & Gynecology

## 2006-11-16 ENCOUNTER — Encounter (HOSPITAL_BASED_OUTPATIENT_CLINIC_OR_DEPARTMENT_OTHER): Payer: Self-pay | Admitting: Obstetrics & Gynecology

## 2006-11-16 ENCOUNTER — Ambulatory Visit (HOSPITAL_BASED_OUTPATIENT_CLINIC_OR_DEPARTMENT_OTHER): Payer: PRIVATE HEALTH INSURANCE | Admitting: Obstetrics & Gynecology

## 2006-11-16 MED ORDER — IBUPROFEN 800 MG PO TABS
ORAL_TABLET | ORAL | Status: AC
Start: 2006-11-16 — End: 2006-12-16

## 2006-11-16 NOTE — Progress Notes (Signed)
S/ using percocet. Has run out of ibuprofen. Has pelvic pain. No incisional pain.    O/ wound well healed    A/ doing well S/P C/S with BTL.    P/ rx for ibuprofen given. Referred to Tb clinic for positive PPD during pregnancy. F/U in 4 weeks for full PP check. Next pap due 3/09.

## 2006-12-14 ENCOUNTER — Ambulatory Visit (HOSPITAL_BASED_OUTPATIENT_CLINIC_OR_DEPARTMENT_OTHER): Payer: PRIVATE HEALTH INSURANCE | Admitting: Obstetrics & Gynecology

## 2006-12-19 ENCOUNTER — Ambulatory Visit (HOSPITAL_BASED_OUTPATIENT_CLINIC_OR_DEPARTMENT_OTHER): Payer: Self-pay | Admitting: Infectious Disease

## 2006-12-21 ENCOUNTER — Encounter (HOSPITAL_BASED_OUTPATIENT_CLINIC_OR_DEPARTMENT_OTHER): Payer: Self-pay | Admitting: Obstetrics & Gynecology

## 2009-04-16 ENCOUNTER — Telehealth (HOSPITAL_BASED_OUTPATIENT_CLINIC_OR_DEPARTMENT_OTHER): Payer: Self-pay | Admitting: Allergy

## 2009-04-16 NOTE — Telephone Encounter (Signed)
Spoke to patient scheduled with new pcp 04/21/09 @1130am  with Dr Loralee Pacas.

## 2009-04-21 ENCOUNTER — Encounter (HOSPITAL_BASED_OUTPATIENT_CLINIC_OR_DEPARTMENT_OTHER): Payer: Self-pay | Admitting: Emergency Medicine

## 2009-04-21 ENCOUNTER — Ambulatory Visit (HOSPITAL_BASED_OUTPATIENT_CLINIC_OR_DEPARTMENT_OTHER): Payer: Medicaid Other | Admitting: Emergency Medicine

## 2009-04-21 VITALS — BP 123/80 | HR 79 | Temp 97.1°F | Ht 60.24 in | Wt 195.0 lb

## 2009-04-21 DIAGNOSIS — R635 Abnormal weight gain: Secondary | ICD-10-CM | POA: Insufficient documentation

## 2009-04-21 DIAGNOSIS — F329 Major depressive disorder, single episode, unspecified: Principal | ICD-10-CM

## 2009-04-21 DIAGNOSIS — E669 Obesity, unspecified: Secondary | ICD-10-CM

## 2009-04-21 DIAGNOSIS — F32A Depression, unspecified: Secondary | ICD-10-CM

## 2009-04-21 MED ORDER — FLUOXETINE HCL 20 MG PO TABS
20.0000 mg | ORAL_TABLET | Freq: Every day | ORAL | Status: DC
Start: 2009-04-21 — End: 2009-10-23

## 2009-04-21 NOTE — Progress Notes (Signed)
Kimberly Lindsey is a 29 year old female  Establishing care - no primary care, medical care since birth of child 2 yrs ago    Pressure in my head since I had my son  I gained a lot of weight - 25 lbs since delivery of son 2 yrs ago.    Depressed - PHQ9 score 18 - rates top 6 items a 3.  No SI  Depressed for a while - approx 4 yrs - I took medication for depression in [place].  I am far away from my family.  Is separated from 2 sons who are in [place] with her mother.  Sleep problem - I sleep too much.  When she was pregnant she was unable to sleep - was given amitriptyline.  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    Obesity - wants to lose weight - hasn't lost pregnancy weight.  Currently not exercising.      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         Past Surgical History    OB ANTEPARTUM CARE C DLVR&POSTPARTUM     Comment times two, Wants BTL with next C/S         Family History    Diabetes Mother    Hypertension Mother    Diabetes Father    Comment: died of diabetes, also cholesterol       Social History    Marital Status: Married             Spouse Name:                       Years of Education:                 Number of children:               Social History Main Topics    Tobacco Use: Never           Alcohol Use: No              Drug Use: No              Sexual Activity: Yes               Partners with: Female       Comment: H/O abnormal pap after her last C/S, S/P                 cauterization, normal since then.  No H/O                STDs.    Social History Narrative    Lives with the father of her third son - and son        2 other sons are in [place] - ages 18, 42 - live with her mom    Ex husband left her - is not supporting 2 sons - told her she was on her own.        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             Review of Systems   Respiratory: Positive for shortness of breath (occasional SOB).  Psychiatric/Behavioral: Positive for depression. Negative for suicidal ideas. The patient has insomnia.         PHQ9 score is 18     Physical Exam   Constitutional: She appears well-developed and well-nourished. No distress.        Body mass index is 37.79 kg/(m^2).  Upset, sad when discussing children in [place].     HENT:   Right Ear: External ear normal.   Left Ear: External ear normal.   Mouth/Throat: Oropharynx is clear and moist.   Eyes: Pupils are equal, round, and reactive to light.   Cardiovascular: Normal rate, regular rhythm and normal heart sounds.    Pulmonary/Chest: Effort normal and breath sounds normal.   Abdominal: Soft. No tenderness. She has no rebound.   Musculoskeletal: She exhibits no edema.   Neurological: She is alert.   Psychiatric:        Sad discussing children in [place]     ASSESSMENT/PLAN:  311F Depression  (primary encounter diagnosis)  Comment: discussed treatment options - side effects of prozac reviewed including sexual side effects and difficulty sleeping -   Plan: fluoxetine - call if diff sleeping    278.00J Obesity  Comment: reviewed importance of exercise, reducing simple carbs  Plan: counseled about weight loss strategies.    More than 50% of 30 minute visit was spent in counselling and coordination of care.

## 2009-04-21 NOTE — Patient Instructions (Addendum)
Walking for 20 minutes 4 times a week.    To improve your health:  Reduce/eliminate white sugar, white flour, white rice and white potato.  Replace with brown rice; whole wheat pasta, and other whole grains.    To lose weight - eat vegetables and limit fruits.    Fluoxetine - 20mg  tablets - take 1/2 tablet a day in the morning for 1 week.  Then take 1 tablet daily.  If you get problem sleeping - come back to clinic.    Ronal Fear - Child psychotherapist -will call to help with therapist, and to check on you.

## 2009-04-22 DIAGNOSIS — E669 Obesity, unspecified: Secondary | ICD-10-CM | POA: Insufficient documentation

## 2009-04-22 HISTORY — DX: Obesity, unspecified: E66.9

## 2009-05-07 ENCOUNTER — Telehealth (HOSPITAL_BASED_OUTPATIENT_CLINIC_OR_DEPARTMENT_OTHER): Payer: Self-pay | Admitting: Social Worker

## 2009-05-07 NOTE — Telephone Encounter (Signed)
This writer left a message for patient asking for a return call.

## 2009-05-08 ENCOUNTER — Telehealth (HOSPITAL_BASED_OUTPATIENT_CLINIC_OR_DEPARTMENT_OTHER): Payer: Self-pay | Admitting: Social Worker

## 2009-05-08 NOTE — Telephone Encounter (Signed)
This writer left another message for patient asking for a return call.

## 2009-05-11 ENCOUNTER — Telehealth (HOSPITAL_BASED_OUTPATIENT_CLINIC_OR_DEPARTMENT_OTHER): Payer: Self-pay | Admitting: Social Worker

## 2009-05-11 ENCOUNTER — Encounter (HOSPITAL_BASED_OUTPATIENT_CLINIC_OR_DEPARTMENT_OTHER): Payer: Self-pay | Admitting: Social Worker

## 2009-05-11 NOTE — Telephone Encounter (Signed)
This writer left another message for patient asking for a return call.

## 2009-05-19 ENCOUNTER — Ambulatory Visit (HOSPITAL_BASED_OUTPATIENT_CLINIC_OR_DEPARTMENT_OTHER): Payer: Medicaid Other | Admitting: Social Worker

## 2009-05-19 ENCOUNTER — Ambulatory Visit (HOSPITAL_BASED_OUTPATIENT_CLINIC_OR_DEPARTMENT_OTHER): Payer: Medicaid Other | Admitting: Emergency Medicine

## 2009-05-19 VITALS — BP 120/70 | HR 114 | Temp 97.5°F | Wt 197.0 lb

## 2009-05-19 DIAGNOSIS — R109 Unspecified abdominal pain: Secondary | ICD-10-CM

## 2009-05-19 DIAGNOSIS — Z7189 Other specified counseling: Secondary | ICD-10-CM

## 2009-05-19 DIAGNOSIS — F4323 Adjustment disorder with mixed anxiety and depressed mood: Secondary | ICD-10-CM

## 2009-05-19 DIAGNOSIS — R10A0 Flank pain, unspecified side: Secondary | ICD-10-CM

## 2009-05-19 LAB — URINE DIP (POINT OF CARE)
BILIRUBIN, URINE: NEGATIVE (ref 0–0)
GLUCOSE, URINE: NEGATIVE mg/dl (ref 0–0)
KETONE, URINE: NEGATIVE mg/dl (ref 0–0)
LEUKOCYTE ESTERASE: NEGATIVE (ref 0–0)
NITRITE, URINE: NEGATIVE
OCCULT BLOOD, URINE: NEGATIVE (ref 0–0)
PH URINE: 7.5 (ref 5.0–8.0)
PROTEIN, URINE: NEGATIVE mg/dl (ref 0–15)
SPECIFIC GRAVITY URINE: 1.02 (ref 1.003–1.030)
UROBILINOGEN URINE: 0.2 mg/dl (ref 0.2–1.0)
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
## 2009-05-19 NOTE — Progress Notes (Signed)
Kimberly Lindsey is a 29 year old female  Depression follow up    Feels like she's having success with the medication.  Reports no SE  Taking one whole pill instead of a half - started that the first day got prescription.  Feels more energy - is busy around the house.  Feels forgetful.  Does not see a therapist, but would like to talk to social worker about getting one.      Not working, sleeping a little more than usual - no trouble falling or staying asleep  Appetite is the same - trying to increase amount of fruits and vegetables.  Walking 48mins/day.    Additional concerns - has list of 10 items - most pressing discussed today - remainder held for next appointment.  Kidney pain - when I don't drink fluid - in bilateral flanks.  Like cramping pain - once or twice a week.  Takes water, cranberry - is helpful.  Sometimes has dysuria, chills, no fever.  No hx hematuria.    Gets pain in back and legs - worse with exercise - okay to use tylenol.         Review of Systems   Genitourinary: Positive for flank pain (when I don't drink enough water - bilaterally).     Physical Exam   Nursing note and vitals reviewed.  Constitutional: She appears well-developed and well-nourished. No distress.        Smiling -    Neurological: She is alert.     U/a dip negative    ASSESSMENT/PLAN:  309.28 Adjustment disorder with mixed anxiety and depressed mood  (primary encounter diagnosis)  Comment: marked improvement in sx - will continue at current dose  Plan: referred to [facility] for assistance with therapist.    789.00C Flank pain  Comment: concerned about kidneys - u/a negative today  Plan: URINE DIP (POINT OF CARE)        Further discussion at next visit    Leg pains - new since exercise program - advised to continue can use otc meds.    F/u for CPE

## 2009-05-22 ENCOUNTER — Telehealth (HOSPITAL_BASED_OUTPATIENT_CLINIC_OR_DEPARTMENT_OTHER): Payer: Self-pay | Admitting: Social Worker

## 2009-05-22 NOTE — Telephone Encounter (Signed)
This writer left a message for patient informing her that hr request for shot term psychotherapy at this clinic is in review. Patient was informed that she will be contacted regarding this by next week.

## 2009-05-27 ENCOUNTER — Telehealth (HOSPITAL_BASED_OUTPATIENT_CLINIC_OR_DEPARTMENT_OTHER): Payer: Self-pay | Admitting: Social Worker

## 2009-05-27 ENCOUNTER — Telehealth (HOSPITAL_BASED_OUTPATIENT_CLINIC_OR_DEPARTMENT_OTHER): Payer: Self-pay | Admitting: Emergency Medicine

## 2009-05-27 NOTE — Telephone Encounter (Signed)
Patient's request for short term psychotherapy at this clinic was reviewed by the Psychiatry Team Leader and patient can have this service.This writer left a message for patient informing her that she can have short term psychotherapy at this clinic. She was informed that she should expect a call from the front desk to arrange this. This Clinical research associate has sent a staff message to [facility] and Marcelino Scot of the front desk asking that patient be called to schedule this.

## 2009-05-27 NOTE — Telephone Encounter (Signed)
Called patient GN:[id number] np appt with Lily Kocher on 06-17-09@ 2pm.

## 2009-06-03 ENCOUNTER — Ambulatory Visit (HOSPITAL_BASED_OUTPATIENT_CLINIC_OR_DEPARTMENT_OTHER): Payer: Medicaid Other | Admitting: Emergency Medicine

## 2009-06-12 ENCOUNTER — Ambulatory Visit (HOSPITAL_BASED_OUTPATIENT_CLINIC_OR_DEPARTMENT_OTHER): Payer: Medicaid Other | Admitting: Social Worker

## 2009-06-12 DIAGNOSIS — F4323 Adjustment disorder with mixed anxiety and depressed mood: Secondary | ICD-10-CM
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
## 2009-06-26 ENCOUNTER — Telehealth (HOSPITAL_BASED_OUTPATIENT_CLINIC_OR_DEPARTMENT_OTHER): Payer: Self-pay | Admitting: Social Worker

## 2009-06-26 ENCOUNTER — Ambulatory Visit (HOSPITAL_BASED_OUTPATIENT_CLINIC_OR_DEPARTMENT_OTHER): Payer: Medicaid Other | Admitting: Social Worker

## 2009-06-26 DIAGNOSIS — F4323 Adjustment disorder with mixed anxiety and depressed mood: Secondary | ICD-10-CM

## 2009-06-26 NOTE — Telephone Encounter (Signed)
Spoke with pt to remind pt appt today 3:00 PM with Lenox Ponds
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
## 2009-06-30 ENCOUNTER — Ambulatory Visit (HOSPITAL_BASED_OUTPATIENT_CLINIC_OR_DEPARTMENT_OTHER): Payer: Medicaid Other | Admitting: Emergency Medicine

## 2009-07-30 ENCOUNTER — Encounter (HOSPITAL_BASED_OUTPATIENT_CLINIC_OR_DEPARTMENT_OTHER): Payer: Self-pay

## 2009-07-30 ENCOUNTER — Emergency Department (HOSPITAL_BASED_OUTPATIENT_CLINIC_OR_DEPARTMENT_OTHER)
Admission: RE | Admit: 2009-07-30 | Disposition: A | Discharge status: Home / Self Care | Payer: Self-pay | Source: Emergency Department | Attending: Emergency Medicine | Admitting: Emergency Medicine

## 2009-07-30 MED ORDER — ACETAMINOPHEN 500 MG PO TABS
1000.00 mg | ORAL_TABLET | Freq: Four times a day (QID) | ORAL | Status: AC
Start: 2009-07-30 — End: 2009-08-02

## 2009-07-30 MED ORDER — FLUTICASONE PROPIONATE 50 MCG/ACT NA SUSP
1.00 | Freq: Two times a day (BID) | NASAL | Status: AC
Start: 2009-07-30 — End: 2009-08-29

## 2009-07-30 MED ORDER — CETIRIZINE HCL 10 MG PO TABS
10.00 mg | ORAL_TABLET | Freq: Every day | ORAL | Status: AC
Start: 2009-07-30 — End: 2009-08-14

## 2009-07-30 MED ORDER — IBUPROFEN 600 MG PO TABS
600.00 mg | ORAL_TABLET | Freq: Four times a day (QID) | ORAL | Status: AC
Start: 2009-07-30 — End: 2009-08-03

## 2009-07-30 NOTE — ED Notes (Signed)
Sore throat, general body aches, vomiting started yesterday  Dizziness today

## 2009-07-30 NOTE — Discharge Instructions (Signed)
Infecções Virais  (Viral Infections)     Hoje, seu médico diagnosticou-o com uma infecção viral. Os vírus são causas freqüentes de dores menores, garganta dolorida menor, nariz escorrido, olhos lacrimejantes, cansaço, alguns tipos de tosse, e infecções gastrintestinais levando a náusea, vômito e diarréia. Embora irritantes, NÃO respondem a antibióticos.     PODE-SE USAR MEDICAÇÃO AUXILIAR, COMO:  Ø Só tome no balcao ou medicinas de receita para dor, incômodo, ou febre como dirigido por seu médico.  Ø Beber muito fluido leve (forçar a ingestão de fluidos).      OS SINAIS DE UMA INFECÇÃO BACTERIANA COMO SEQÜELA AO VÍRUS PODEM SER:  Ø Garganta dolorida e dificuldade em engolir.   Ø Glândulas inchadas no pescoço.   Ø Forte dor de cabeça.   Ø Sensibilidade no seio paranasal.   Ø Extremo mal-estar persistente (sentir-se mal), dores musculares e fadiga (cansaço).   Ø Tosse persistente com catarro, falta de fôlego ou dor de peito.   Ø Expectorar catarro amarelo, verde ou marrom ao tossir.     PROCURE ASSISTÊNCIA MÉDICA IMEDIATAMENTE:  Ø Telefone seu médico ou vá até ele se os sintomas acima aparecerem. Se tiver fortes dores de cabeça ou de pescoço, deverá ser atendido imediatamente.   Ø Você ou seu filho apresentar temperatura oral superior a 38,9°C (102° F), não controlada por medicamento.  Ø Seu filho tiver mais de três meses e apresentar temperatura retal de 38,9° C (102º F) ou superior.  Ø Seu filho tiver três meses, ou menos, e apresentar temperatura retal de 38º C (100,4º F) ou superior.     Você pode obter a publicação “A Common Sense Guide to Antibiotics” gratuitamente, bastando telefonar para 1-800-586-4872 (1-800-LUNG-[place]).     CERTIFIQUE-SE DE:  Ø Compreende as instruções referentes à alta.   Ø Irá monitorar sua condição.  Ø Procurará assistência médica imediatamente, conforme indicado.     Document Released: 02/14/2005  Document Re-Released: 12/12/2008  ExitCare® Patient Information ©2010 [employer].

## 2009-07-31 LAB — EMERGENCY ROOM NOTE

## 2009-09-10 ENCOUNTER — Encounter (HOSPITAL_BASED_OUTPATIENT_CLINIC_OR_DEPARTMENT_OTHER): Payer: Self-pay | Admitting: Social Worker

## 2009-09-10 NOTE — Progress Notes (Signed)
PSYCHIATRY TERMINATION AND TRANSFER NOTE    Termination Document    Date treatment started: 06/12/09    Transfer/termination date: 09/10/09    Reason for treatment: Depressive sxs originating from separation from two children who are in [place].    Treatment course (response to medications, compliance): pt attended evaluation appointment and one follow up appointment at which time she indicated that she did not feel a need to continue.  Pt reported she felt great relief to talk with someone and was informed by clinician that she can return at any time for services if needed.    Outstanding Issues: Pt will continue to work towards returning to [place] to be near her children    Safe to refill: N/A    Risk level: low    Plan: Pt able to return for treatment with this clinician.    For transfers, the treatment plan will now be the responsibility of: not applicable    (Clinician, please route/cc this encounter to your team/program administrative coordinator so that the treatment plan database can be updated.)

## 2009-10-02 ENCOUNTER — Ambulatory Visit (HOSPITAL_BASED_OUTPATIENT_CLINIC_OR_DEPARTMENT_OTHER): Payer: Medicaid Other | Admitting: Family Medicine

## 2009-10-02 ENCOUNTER — Encounter (HOSPITAL_BASED_OUTPATIENT_CLINIC_OR_DEPARTMENT_OTHER): Payer: Self-pay | Admitting: Family Medicine

## 2009-10-02 VITALS — BP 128/78 | HR 96 | Temp 96.9°F | Ht 60.0 in | Wt 192.0 lb

## 2009-10-02 DIAGNOSIS — M549 Dorsalgia, unspecified: Secondary | ICD-10-CM

## 2009-10-02 DIAGNOSIS — F4323 Adjustment disorder with mixed anxiety and depressed mood: Secondary | ICD-10-CM

## 2009-10-02 DIAGNOSIS — E669 Obesity, unspecified: Secondary | ICD-10-CM

## 2009-10-02 DIAGNOSIS — Z13220 Encounter for screening for lipoid disorders: Secondary | ICD-10-CM

## 2009-10-02 DIAGNOSIS — R000 Tachycardia, unspecified: Secondary | ICD-10-CM

## 2009-10-02 LAB — CBC, PLATELET & DIFFERENTIAL
BASOPHIL %: 0.2 % (ref 0.0–2.0)
EOSINOPHIL %: 2 % (ref 0.0–7.0)
HEMATOCRIT: 37.5 % (ref 36.0–48.0)
HEMOGLOBIN: 12.6 g/dl (ref 12.0–16.0)
LYMPHOCYTE %: 21.5 % (ref 13.0–39.0)
MEAN CORP HGB CONC: 33.7 g/dl (ref 32.0–36.0)
MEAN CORPUSCULAR HGB: 30.2 pg (ref 27.0–33.0)
MEAN CORPUSCULAR VOL: 89.8 fl (ref 80.0–100.0)
MEAN PLATELET VOLUME: 11.1 fl — ABNORMAL HIGH (ref 6.4–10.8)
MONOCYTE %: 5.5 % (ref 1.0–12.0)
NEUTROPHIL %: 70.8 % (ref 46.0–79.0)
PLATELET COUNT: 223 10*3/uL (ref 150–400)
RBC DISTRIBUTION WIDTH: 12.4 % (ref 11.5–14.3)
RED BLOOD CELL COUNT: 4.17 M/uL — ABNORMAL LOW (ref 4.50–5.10)
WHITE BLOOD CELL COUNT: 8.7 10*3/uL (ref 4.0–10.8)

## 2009-10-02 LAB — PLATELET SCAN: PLATELET ESTIMATE: NORMAL

## 2009-10-02 MED ORDER — NAPROXEN 500 MG PO TABS
500.0000 mg | ORAL_TABLET | Freq: Two times a day (BID) | ORAL | Status: DC
Start: 2009-10-02 — End: 2009-10-23

## 2009-10-02 NOTE — Progress Notes (Signed)
Kimberly Lindsey is a 29 year old female  CC:   Patient presents with:    Other - neck and spine pain    Palpitations    Normally sees Dr. Loralee Pacas, first time seen by myself.      309.28 Adjustment disorder with mixed anxiety and depressed mood  (primary encounter diagnosis)  278.00J Obesity  Used to take prozac, but stopped after 1 month  She felt that it wasn't helping her  She feels that her depression is "much better" and that she doesn't need meds or therapy  Does still c/o poor energy and poor appetite however  She is bothered by her obesity, and feels that she has poor energy because she is overweight  Does c/o constipation at times but no heat/cold intolerance  Denies SI, AVH    Patient's Language of Care (If not English, next question is required) [place] [15]         Patient refused PHQ-9  No    Little interest or pleasure in doing things  1    Feeling down, depressed, or hopeless  0    Trouble falling asleep, staying asleep, or sleeping too much  0    Feeling tired or having low energy  3    Poor appetite or overeating  3    Feeling bad about yourself - or that  you are a failure or have let yourself or your family down  0    Trouble concentrating on things, such as reading the newspaper or watching television  0    Moving or speaking so slow that other people could have noticed. Or  the opposite - being so fidgety or restless that you have been moving around a lot more than usual  0    Thoughts that you would be better off dead or of hurting yourself in some way  0    PHQ-9 Total Score (172) 7    If you checked off any problems, how difficult have these problems made it for you to do your work, take care of things at home, or get along with people?  VERY DIFFICULT            THYROID SCREEN TSH (uIU/mL)   Date     Date  Value    05/15/2006  0.75    ----------          785.0H Tachycardia  Started feeling that she was having rapid heartbeats  Started 6 months ago, was watching TV when she suddenly felt her  heart beating fast but just for a few seconds (but not irregular or skipping beats)  Coming more frequently now  Last episode was 2 days ago  Now is once a week  Still mostly happening when she's not active  Still only lasting for a few seconds at a time  During these episodes has a sensation of "pressure" but no SOB, no dizziness but a "pressure" in her head  Is worried because grandmother had a stroke, and mother had heart problems  Doesn't drink much water/liquid each day, maybe 4 cups a day    Most Recent Pulse Reading(s)  10/02/09 : 96  07/30/09 : 102  05/19/09 : 114  04/21/09 : 79  05/15/06 : 92              724.5E Back pain  Complains of low back pain for 13 years  Is in the upper back as well, near her neck  The pain is positional  with bending or lifting, with radiation down both legs. Mechanism of injury: Recalls falling when she was a child. Symptoms have been waxing and waning since that time. Prior history of back problems: recurrent self limited episodes of low back pain in the past. There is no numbness in the legs. Precipitating factors: recent heavy lifting (her son)    As for red flag signs, patient denies fevers, prior history of cancer, bowel/bladder incontinence or saddle anesthesia, severe worsening of pain, unintended weight loss, or pain that awakens at night.    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 Disp: 30 tablet Rfl: 2         Review of Patient's Allergies indicates:  No Known Allergies      Social History   Marital Status: Married  Spouse Name: N/A    Years of Education: N/A  Number of Children: N/A     Occupational History  None on file     Social History Main Topics   Smoking status: Never Smoker     Smokeless tobacco:     Alcohol Use: No    Drug Use: No    Sexually Active: Yes  Partner(s): Female    Comment: H/O abnormal pap after her last C/S, S/P cauterization, normal since then.  No H/O STDs.     Other Topics Concern   None on file     Social History Narrative    Lives with the father of her third son - and son        2 other sons are in [place] - ages 78, 63 - live with her mom    Ex husband left her - is not supporting 2 sons - told her she was on her own.        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           Family History    Diabetes Mother     Hypertension Mother     Diabetes Father     Comment: died of diabetes, also cholesterol         TETANUS (16 AND OVER) due on 04/18/1994  PHYSICAL EXAM (AGE 38-39) due on 04/18/1996  LIPID SCREENING due on 04/18/1996  HEP B HIGH RISK VACCINE EVAL (ONCE) due on 04/18/1996  PAP SMEAR due on 05/14/2009    BP 128/78   Pulse 96   Temp  96.9 F (36.1 C)   Ht 5' (1.524 m)   Wt 192 lb (87.091 kg)   BMI 37.50 kg/m2   SpO2 99%  Pain Score: 7 (7/10)    Physical Exam   Constitutional: Patient is oriented to person, place, and time. Patient appears well-developed and well-nourished. No distress.   HENT:   Head: Normocephalic and atraumatic.   Eyes: Conjunctivae are normal.   Neck: Normal range of motion.   Cardiovascular: Normal rate, regular rhythm, normal heart sounds and intact distal pulses.  Exam reveals no gallop and no friction rub.    No murmur heard.  Pulmonary/Chest: Effort normal and  breath sounds normal. No respiratory distress. Patient has no wheezes. Patient has no rales. Patient exhibits no tenderness.   Abdominal: Soft. Bowel sounds are normal. Patient exhibits no distension. No tenderness.   Musculoskeletal: Patient appears to be in mild pain, normal gait noted.  Inspection of the back with no deformity or asymmetry.    CTL spine area reveals no local tenderness or mass.   Normal cervical, thoracic, and lumbar spine ROM noted.   Paraspinal muscle tenderness noted at the upper thoracic and mid lumbar level.  Straight leg raise is negative bilat.   Peripheral pulses are palpable.   Neuro: DTRs (patella for L3-L4, achilles for L5-S1), motor strength normal in the lower extremities bilaterally, including heel (L4) and toe gait (S1) as well as resisted dorsiflexion of toes (L5) bilaterally.  Sensation intact at the medial ankle (L4), great toe (L5), and lateral foot (S1) bilaterally.  Skin: Patient is not diaphoretic.   Psychiatric: Patient has a normal mood and affect. Behavior is normal. Judgment and thought content normal.       Estimated Body mass index is 37.50 kg/(m^2) as calculated from the following:    Height as of this encounter: 5\' 0" (1.524 m).    Weight as of this encounter: 192 lb(87.091 kg).    309.28 Adjustment disorder with mixed anxiety and depressed mood  (primary encounter diagnosis)  Comment: Patient claims she no longer needs treatment or therapy.  Her PHQ-9 score is indeed better overall, although the degree of difficulty she has with these sx remains high  Plan: I don't feel I know this patient well enough and she wanted to concentrate on her other complaints for today.  For now I will not pursue this but will bring this up again at future visits    278.00J Obesity  Comment: patient's obesity is contributing to back pain and also self-esteem issues  Plan: REFERRAL TO NUTRITION ( INT) NON-DIABETES        Talked about exercise as well but may need to work on back pain  first    785.0H Tachycardia  Comment: I do appreciate that she's been tachy before, but I don't think she has an arrythmia .  Need to eval for hyperthyroid, electrolyte abnl, or anemia.  ?panic attacks  Plan: THYROID SCREEN TSH, COMPLETE CBC W/AUTO DIFF         WBC, BASIC METABOLIC PANEL        Will see what labs show, CPE at next visit and review results then.  Consider holter monitor    724.5E Back pain  Comment: CTL back pain, likely due to strain and to obesity  Plan: naproxen (NAPROSYN) 500 MG tablet, REFERRAL TO         PHYSICAL THERAPY ( INT), ORDER FOR GENERAL         X-RAY  Will eval for any signs of deformity on xray, try naproxen for pain and try PT as well    V77.1A Screening, diabetes mellitus  Comment: Due  Plan: ROUTINE VENIPUNCTURE, HEMOGLOBIN A1C            V77.91G Screening, lipid  Comment: Due  Plan: ROUTINE VENIPUNCTURE, CHOLESTEROL, HIGH DENSITY        LIPOPROTEIN, LOW DENSITY LIPOPROTEIN,DIRECT            I have reviewed the past medical, surgical, social and family history and updated these sections of EpicCare as relevant. All interim labs, test results, and consult notes were reviewed and discussed with Kimberly Lindsey or their legal guardian/representative. Medications were reconciled during this visit and a current medication list was given to the patient at the end of the visit.

## 2009-10-05 LAB — CHOLESTEROL: Cholesterol: 147 mg/dl (ref 0–200)

## 2009-10-05 LAB — BASIC METABOLIC PANEL
ANION GAP: 6 mmol/L (ref 2–25)
BUN (UREA NITROGEN): 5 mg/dl — ABNORMAL LOW (ref 6–20)
CALCIUM: 9 mg/dl (ref 8.6–10.3)
CARBON DIOXIDE: 27 mmol/L (ref 22–32)
CHLORIDE: 104 mmol/L (ref 101–111)
CREATININE: 0.7 mg/dl (ref 0.4–1.2)
ESTIMATED GLOMERULAR FILT RATE: >60 mL/min (ref >60)
Glucose Random: 88 mg/dl (ref 74–160)
POTASSIUM: 4 mmol/L (ref 3.5–5.1)
SODIUM: 137 mmol/L (ref 135–144)

## 2009-10-05 LAB — HEMOGLOBIN A1C
ESTIMATED AVERAGE GLUCOSE: 97 (ref 74–160)
HEMOGLOBIN A1C: 5 % (ref 0–6.0)

## 2009-10-05 LAB — THYROID SCREEN TSH REFLEX FT4: THYROID SCREEN TSH REFLEX FT4: 1.88 u[IU]/mL (ref 0.34–5.60)

## 2009-10-05 LAB — CHG LIPOPROTEIN DIR MEAS HIGH DENSITY CHOLESTEROL: HIGH DENSITY LIPOPROTEIN: 45 mg/dl (ref 35–85)

## 2009-10-05 LAB — CHG LIPOPROTEIN DIRECT MEASUREMENT LDL CHOLESTEROL: LOW DENSITY LIPOPROTEIN DIRECT: 85 mg/dl (ref 0–100)

## 2009-10-05 NOTE — Progress Notes (Addendum)
Quick Note:    Labs normal, f/u and discuss results on 8/26  ______

## 2009-10-06 ENCOUNTER — Other Ambulatory Visit: Payer: Self-pay | Admitting: Family Medicine

## 2009-10-06 LAB — XR LUMBAR SPINE 2 OR 3 VIEWS

## 2009-10-06 LAB — XR THORACIC SPINE 2 VIEWS

## 2009-10-06 LAB — XR CERVICAL SPINE WITH OBLIQUES 5 VIEWS

## 2009-10-06 NOTE — Progress Notes (Addendum)
Quick Note:    Mild thoracic curvature and some signs of DDD in the cervical spine, otherwise normal. Will share with her in person 8/26  ______

## 2009-10-06 NOTE — Progress Notes (Addendum)
Quick Note:    Mild thoracic curvature and some signs of DDD in the cervical spine, otherwise normal. Will share with her in person 8/26  ______

## 2009-10-23 ENCOUNTER — Ambulatory Visit (HOSPITAL_BASED_OUTPATIENT_CLINIC_OR_DEPARTMENT_OTHER): Payer: Medicaid Other | Admitting: Family Medicine

## 2009-10-23 ENCOUNTER — Encounter (HOSPITAL_BASED_OUTPATIENT_CLINIC_OR_DEPARTMENT_OTHER): Payer: Self-pay | Admitting: Family Medicine

## 2009-10-23 VITALS — BP 124/78 | HR 94 | Temp 97.7°F | Ht 60.0 in | Wt 193.0 lb

## 2009-10-23 DIAGNOSIS — R002 Palpitations: Secondary | ICD-10-CM

## 2009-10-23 DIAGNOSIS — M549 Dorsalgia, unspecified: Secondary | ICD-10-CM

## 2009-10-23 DIAGNOSIS — Z0000 Encounter for general adult medical examination without abnormal findings: Secondary | ICD-10-CM

## 2009-10-23 DIAGNOSIS — F4323 Adjustment disorder with mixed anxiety and depressed mood: Secondary | ICD-10-CM

## 2009-10-23 DIAGNOSIS — R0981 Nasal congestion: Secondary | ICD-10-CM

## 2009-10-23 DIAGNOSIS — Z01419 Encounter for gynecological examination (general) (routine) without abnormal findings: Secondary | ICD-10-CM

## 2009-10-23 MED ORDER — LORATADINE 10 MG PO TABS
10.0000 mg | ORAL_TABLET | Freq: Every day | ORAL | Status: DC | PRN
Start: 2009-10-23 — End: 2009-12-24

## 2009-10-23 MED ORDER — NAPROXEN 500 MG PO TABS
500.0000 mg | ORAL_TABLET | Freq: Two times a day (BID) | ORAL | Status: DC
Start: 2009-10-23 — End: 2009-12-24

## 2009-10-23 MED ORDER — CITALOPRAM HYDROBROMIDE 20 MG PO TABS
20.0000 mg | ORAL_TABLET | Freq: Every day | ORAL | Status: DC
Start: 2009-10-23 — End: 2009-12-24

## 2009-10-23 NOTE — Progress Notes (Addendum)
Quick Note:    Reviewed results with patient today in clinic    ______

## 2009-10-23 NOTE — Progress Notes (Signed)
Kimberly Lindsey is a 29 year old female  CC:   Patient presents with:    Physical    Nasal Congestion    Cough    Palpitations    Neck Pain        V70.0 Routine general medical examination at a health care facility  (primary encounter diagnosis)  V72.31 Routine gynecological examination  Overdue for this  Recalls that 10 years ago she had problems with an abnormal pap smear  But pap has been normal since then    785.1 Palpitations  To review from last visit  Started feeling that she was having rapid heartbeats  Started 6 months ago, was watching TV when she suddenly felt her heart beating fast but just for a few seconds (but not irregular or skipping beats)  Coming more frequently now, almost every day--last one was yesterday  Still mostly happening when she's not active  Still only lasting for a few seconds at a time  During these episodes has a sensation of "pressure" but no SOB, no dizziness but a "pressure" in her head  Is worried because grandmother had a stroke, and mother had heart problems  Last visit noted she doesn't drink much water/liquid each day, maybe 4 cups a day  I told her to drink more water and she's drinking more, but still having the same problems  Labs including TSH and CBC and electrolytes completely normal    309.28 Adjustment disorder with mixed anxiety and depressed mood  Denies depression  Says it's mostly problems with sleeping  Sometimes stays up at night at times, but more problematic is sleeping too much  Has been that way since she has been a child    724.5E Back pain  Never picked up rx for naproxen because [facility] is too far away  Just taking tylenol and ibuprofen  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      478.19BE Nasal congestion  C/o stuffy nose  A little bit of a cough today, says it's because she has a cold  Started 2 days ago  No fevers, just URI sx  No sick contacts              Patient Active Problem List:     Adjustment Disorder with Mixed Anxiety and Depressed Mood  [309.28]     Obesity [278.00J]        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 Disp: 60 tablet Rfl: 1   fluoxetine (PROZAC) 20 MG tablet Take 1 tablet by mouth daily for 90 days. Disp: 30 tablet Rfl: 2         Review of Patient's Allergies indicates:  No Known Allergies      Social History   Marital Status: Married  Spouse Name: N/A    Years of Education: N/A  Number of Children: N/A     Occupational History  None on file     Social History Main Topics  Smoking status: Never Smoker     Smokeless tobacco:     Alcohol Use: No    Drug Use: No    Sexually Active: Yes  Partner(s): Female    Comment: H/O abnormal pap after her last C/S, S/P cauterization, normal since then.  No H/O STDs.     Other Topics Concern   None on file     Social History Narrative    Lives with the father of her third son - and son        2 other sons are in [place] - ages 45, 65 - live with her mom    Ex husband left her - is not supporting 2 sons - told her she was on her own.        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           Family History    Diabetes Mother     Hypertension Mother     Diabetes Father     Comment: died of diabetes, also cholesterol         TETANUS (16 AND OVER) due on 04/18/1994  PHYSICAL EXAM (AGE 54-39) due on 04/18/1996  LIPID SCREENING due on 04/18/1996  HEP B HIGH RISK VACCINE EVAL (ONCE) due on 04/18/1996  PAP SMEAR due on 05/14/2009    BP 124/78   Pulse 94   Temp(Src) 97.7 F (36.5 C) (Oral)   Ht 5' (1.524 m)   Wt 193 lb (87.544 kg)   BMI 37.69 kg/m2   SpO2 99%  Pain Score: 8 (8/10)      Physical Exam   Constitutional: She is  oriented to person, place, and time. She appears well-developed and well-nourished. No distress.   HENT:   Head: Normocephalic and atraumatic.   Right Ear: External ear normal.   Left Ear: External ear normal.   Nose: Nose with mild bogginess and some rhinorrhea  Mouth/Throat: Oropharynx is clear and moist. No oropharyngeal exudate.   Eyes: Conjunctivae and extraocular motions are normal. Pupils are equal, round, and reactive to light. Right eye exhibits no discharge. Left eye exhibits no discharge. No scleral icterus.   Neck: Normal range of motion. Neck supple. No thyromegaly present.   Cardiovascular: Normal rate, regular rhythm, normal heart sounds and intact distal pulses.  Exam reveals no gallop and no friction rub.    No murmur heard.  Pulmonary/Chest: Effort normal and breath sounds normal. No respiratory distress. She has no wheezes. She has no rales. She exhibits no tenderness.   Abdominal: Soft. Bowel sounds are normal. She exhibits no distension and no mass. No tenderness. She has no rebound and no guarding.  Genitourinary:   Breast: No breast swelling, tenderness, masses, nipple discharge or bleeding in all four quadrants bilaterally.  Pelvic exam was performed with patient supine. There is no rash, tenderness, lesion or injury on the right labia. There is no rash, tenderness, lesion or injury on the left labia. Uterus is not deviated, not enlarged, not fixed and not tender. Cervix exhibits no motion tenderness, no discharge and no friability. Right adnexum displays no mass, no tenderness and no fullness. Left adnexum displays no mass, no tenderness and no fullness. No erythema, tenderness or bleeding around the vagina.  Urethra without tenderness, masses, scarring or prolapse.  No discharge found.   Bladder without fullness, masses, or tenderness.  Musculoskeletal: Normal range of motion. She exhibits no edema and no tenderness.  Lymphadenopathy:        Head (right side): No submental, no  submandibular, no tonsillar, no preauricular, no posterior auricular and no occipital adenopathy present.        Head (left side): No submental, no submandibular, no tonsillar, no preauricular, no posterior auricular and no occipital adenopathy present.     She has no cervical adenopathy.     She has no axillary adenopathy.        Right: No inguinal, no supraclavicular and no epitrochlear adenopathy present.        Left: No inguinal, no supraclavicular and no epitrochlear adenopathy present.   Neurological: She is alert and oriented to person, place, and time. She has normal reflexes in the biceps, triceps, brachioradialis, patella, and achilles tendons bilaterally. No cranial nerve deficit in CN 2-12. She exhibits normal muscle tone. Coordination normal on finger-nose testing.   Sensation intact and strength 5/5 on the upper and lower extremities throughout.  Skin: Skin is warm and dry. No rash noted. She is not diaphoretic. No erythema. No pallor.  No lesions or moles with asymmetry, border irregularity, color abnormality or change, or diameter > 6mm.  Psychiatric: She has a normal mood and affect. Her behavior is normal. Judgment and thought content normal.       EKG shows sinus arrythmia, intervals are normal and no Q waves or ST or T wave abnormalities      Estimated Body mass index is 37.69 kg/(m^2) as calculated from the following:    Height as of this encounter: 5\' 0" (1.524 m).    Weight as of this encounter: 193 lb(87.544 kg).    V70.0 Routine general medical examination at a health care facility  (primary encounter diagnosis)  Comment: Complicated 29 yo F with multiple complaints to be addressed below  Plan: Discussed routine anticipatory guidance, including use of sunscreen when outdoors, smoke alarms, wearing seat belts, safe sex practices, and avoidance of alcohol and tobacco.  Diet and exercise advice was also discussed with the patient.      V72.31 Routine gynecological examination  Comment: Due,  prior hx of abnormals but more recent paps normal per patient  Plan: CERV/VAG CANC SCRN,PELV/BREAST EXAM, OBTAINING         SCREEN PAP SMEAR, HUMAN PAPILLOMAVIRUS,         CYTOPATH, C/V, THIN LAYER            785.1 Palpitations  Comment: Uncertain etiology, question of anxiety as labs are normal but could perhaps have an undiagnosed arrythmia  Plan: REFERRAL TO CARDIO-PULMONARY LAB ( INT), EKG **        DONE BY NURSE OR MD **        Will get holter and review results at next visit    309.28 Adjustment disorder with mixed anxiety and depressed mood  Comment: I do think that the patient has mild depression at least, and this could explain her sx.  May have anxiety that could explain palpitations  Plan: citalopram (CELEXA) 20 MG tablet        Will try this first, warned of possible side effects and that this is different from Prozac.  She declines seeing a therapist for now, will f/u in a few weeks in [place]    724.5E Back pain  Comment: With evidence of DDD in neck and back  Plan: naproxen (NAPROSYN) 500 MG tablet, REFERRAL TO         ORTHOPEDICS ( INT)  Will send rx to [facility] pharmacy as it is more convenient, told her not to take any other NSAIDs if she's taking this.    478.19BE Nasal congestion  Comment: URI but also seems to have chronic sinus/rhinorrhea sx  Plan: loratadine (CLARITIN) 10 MG tablet        Call/RTC if sx do not improve after 1 week      I have reviewed the past medical, surgical, social and family history and updated these sections of EpicCare as relevant. All interim labs, test results, and consult notes were reviewed and discussed with Kimberly Lindsey or their legal guardian/representative. Medications were reconciled during this visit and a current medication list was given to the patient at the end of the visit.

## 2009-10-26 LAB — EKG

## 2009-10-29 LAB — CYTOPATH, C/V, THIN LAYER

## 2009-10-30 LAB — HUMAN PAPILLOMAVIRUS (HPV): HUMAN PAPILLOMAVIRUS: NEGATIVE

## 2009-10-31 NOTE — Progress Notes (Addendum)
Quick Note:    Pap smear normal, will share with her at next appt on 11/23/2009  ______

## 2009-11-04 ENCOUNTER — Ambulatory Visit (HOSPITAL_BASED_OUTPATIENT_CLINIC_OR_DEPARTMENT_OTHER): Payer: Medicaid Other | Admitting: Physical Medicine & Rehabilitation

## 2009-11-04 ENCOUNTER — Encounter (HOSPITAL_BASED_OUTPATIENT_CLINIC_OR_DEPARTMENT_OTHER): Payer: Self-pay | Admitting: Physical Medicine & Rehabilitation

## 2009-11-04 DIAGNOSIS — M542 Cervicalgia: Secondary | ICD-10-CM

## 2009-11-04 DIAGNOSIS — M545 Low back pain, unspecified: Secondary | ICD-10-CM

## 2009-11-04 DIAGNOSIS — M549 Dorsalgia, unspecified: Secondary | ICD-10-CM

## 2009-11-04 NOTE — Progress Notes (Signed)
The primary progress note for this visit has been dictated through E-Scription. It can be viewed as an attachment to this encounter or through Chart Review under the Other Tab as an Orthopedic Office Note.      Review of Systems: Constitutional, Eyes, ENT/Mouth, Cardiovascular, Respiratory, GI, GU, Neuro, Psych, Heme/Lymph, Skin, Musculoskeletal was reviewed and is NEGATIVE except for what is dictated in the note.    MT ACCT #:  [id number]

## 2009-11-05 LAB — ORTHOPEDIC OFFICE NOTE

## 2009-11-06 ENCOUNTER — Ambulatory Visit
Admit: 2009-11-06 | Discharge: 2009-11-06 | Disposition: A | Discharge status: Home / Self Care | Payer: Self-pay | Source: Ambulatory Visit

## 2009-11-16 LAB — HOLTER MONITORING

## 2009-11-23 ENCOUNTER — Other Ambulatory Visit (HOSPITAL_BASED_OUTPATIENT_CLINIC_OR_DEPARTMENT_OTHER): Payer: Medicaid Other | Admitting: Family Medicine

## 2009-11-23 NOTE — Progress Notes (Addendum)
Quick Note:    Dear Kimberly Lindsey,    [place] speaking patient, she missed her appointment for follow up with me today. Please call her and reschedule an appointment with me soon, and I can discuss her lab results with her then. Thanks!    --Jerene Dilling  ______

## 2009-11-25 ENCOUNTER — Encounter (HOSPITAL_BASED_OUTPATIENT_CLINIC_OR_DEPARTMENT_OTHER): Payer: Medicaid Other | Admitting: Family Medicine

## 2009-12-01 NOTE — Progress Notes (Addendum)
Quick Note:    Dear Kimberly Lindsey,    Could you call this patient again? She needs follow up for her depression, but she's missed both appointments with me, thanks!    --Jerene Dilling  ______

## 2009-12-10 ENCOUNTER — Encounter (HOSPITAL_BASED_OUTPATIENT_CLINIC_OR_DEPARTMENT_OTHER): Payer: Medicaid Other | Admitting: Registered"

## 2009-12-14 ENCOUNTER — Telehealth (HOSPITAL_BASED_OUTPATIENT_CLINIC_OR_DEPARTMENT_OTHER): Payer: Self-pay | Admitting: Family Medicine

## 2009-12-14 NOTE — Telephone Encounter (Signed)
Pt. Needs f/u appt with dr. Annye Rusk-  I did call pt with portuguese interprete at the phone.  Pt's # not on service.    Interpreter called other Emergency contact-No answer- No answer machine. Will be sending letter.

## 2009-12-24 ENCOUNTER — Encounter (HOSPITAL_BASED_OUTPATIENT_CLINIC_OR_DEPARTMENT_OTHER): Payer: Self-pay | Admitting: Family Medicine

## 2009-12-24 ENCOUNTER — Ambulatory Visit (HOSPITAL_BASED_OUTPATIENT_CLINIC_OR_DEPARTMENT_OTHER): Payer: Medicaid Other | Admitting: Family Medicine

## 2009-12-24 VITALS — BP 110/80 | HR 88 | Temp 97.9°F | Wt 197.0 lb

## 2009-12-24 DIAGNOSIS — F4323 Adjustment disorder with mixed anxiety and depressed mood: Secondary | ICD-10-CM

## 2009-12-24 DIAGNOSIS — M549 Dorsalgia, unspecified: Secondary | ICD-10-CM

## 2009-12-24 DIAGNOSIS — R0981 Nasal congestion: Secondary | ICD-10-CM

## 2009-12-24 DIAGNOSIS — R002 Palpitations: Secondary | ICD-10-CM

## 2009-12-24 DIAGNOSIS — G8929 Other chronic pain: Secondary | ICD-10-CM

## 2009-12-24 MED ORDER — NAPROXEN 500 MG PO TABS
500.0000 mg | ORAL_TABLET | Freq: Two times a day (BID) | ORAL | Status: AC
Start: 2009-12-24 — End: 2010-01-24

## 2009-12-24 MED ORDER — PROPRANOLOL HCL 40 MG PO TABS
40.0000 mg | ORAL_TABLET | Freq: Every day | ORAL | Status: DC
Start: 2009-12-24 — End: 2010-08-13

## 2009-12-24 MED ORDER — CITALOPRAM HYDROBROMIDE 20 MG PO TABS
20.0000 mg | ORAL_TABLET | Freq: Every day | ORAL | Status: AC
Start: 2009-12-24 — End: 2010-03-26

## 2009-12-24 NOTE — Progress Notes (Signed)
Kimberly Lindsey is a 29 year old female  CC:   Patient presents with:    Anxiety - follow-up    Back Pain - follow-up        785.1 Palpitations  (primary encounter diagnosis)  309.28 Adjustment disorder with mixed anxiety and depressed mood  Still having palpitations.  Holter monitor came back with normal results overall.  Did have PACs, but during the 2 events that she recorded there were no abnormalities seen  In terms of her depression sx however, she is feeling a lot better  Score on PHQ-9 was 0 today  Denies SI or AVH  Still doesn't want to see a therapist  She feels that things are a lot better now overall    724.5AP Chronic back pain  Saw physiatry  Says she was told she has no back problems, but was encouraged to try PT  Says she was also told not to take naproxen anymore  But she felt the naproxen helped a lot  Points to her lower back and upper back in general as areas with pain    478.19BE Nasal congestion  Doesn't have congestion anymore, is feeling much better and stopped taking the loratadine already    Patient Active Problem List:     Adjustment Disorder with Mixed Anxiety and Depressed Mood [309.28]     Obesity [278.00J]        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 Disp: 30 tablet Rfl: 3         Review of Patient's Allergies indicates:  No Known Allergies      Social History   Marital Status: Married  Spouse Name: N/A    Years of Education: N/A  Number of Children: N/A     Occupational History  None on file     Social History Main Topics   Smoking status: Never Smoker     Smokeless tobacco:     Alcohol Use: No    Drug Use: No    Sexually Active: Yes  Partner(s): Female     Comment: H/O abnormal pap after her last C/S, S/P cauterization, normal since then.  No H/O STDs.     Other Topics Concern   None on file     Social History Narrative    Lives with the father of her third son - and son        2 other sons are in [place] - ages 32, 15 - live with her mom    Ex husband left her - is not supporting 2 sons - told her she was on her own.        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           Family History    Diabetes Mother     Hypertension Mother     Diabetes Father     Comment: died of diabetes, also cholesterol         TETANUS (  16 AND OVER) due on 04/18/1994  HEP B HIGH RISK VACCINE EVAL (ONCE) due on 04/18/1996    BP 110/80   Pulse 88   Temp(Src) 97.9 F (36.6 C) (Temporal)   Wt 197 lb (89.359 kg)   SpO2 97%  Pain Score: 0 (0/10)    Physical Exam   Constitutional: Patient is oriented to person, place, and time. Patient appears well-developed and well-nourished. No distress.   HENT:   Head: Normocephalic and atraumatic.   Eyes: Conjunctivae are normal.   Neck: Normal range of motion.   Cardiovascular: Normal rate, regular rhythm, normal heart sounds and intact distal pulses.  Exam reveals no gallop and no friction rub.    No murmur heard.  Pulmonary/Chest: Effort normal and breath sounds normal. No respiratory distress. Patient has no wheezes. Patient has no rales. Patient exhibits no tenderness.   Abdominal: Soft. Bowel sounds are normal. Patient exhibits no distension. No tenderness.   Musculoskeletal: Normal range of motion.  Tenderness to lower and upper back diffusely  Neurological: Patient is alert and oriented to person, place, and time.   Skin: Patient is not diaphoretic.   Psychiatric: Patient has a normal mood and affect. Behavior is normal. Judgment and thought content normal.       Estimated Body mass index is 38.47 kg/(m^2) as calculated from the following:    Height as of 10/23/09: 5\' 0" (1.524 m).    Weight as of this encounter: 197  lb(89.359 kg).    785.1 Palpitations  (primary encounter diagnosis)  Comment: With overall normal Holter, did have PACs but didn't seem to match when she was having her symptoms.  Other labs normal so far.  I do think that anxiety is at least contributing to this  Plan: propranolol (INDERAL) 40 MG tablet        Will try low dose of this BB to see if it helps with anxiety sx.  Warned her about possible side effects to watch for (lightheadedness and dizziness in particular) but also to let me know if palpitations get worse    309.28 Adjustment disorder with mixed anxiety and depressed mood  Comment: Mild depression is improved significantly, but I think she would still benefit from SSRI.  I would not adjust dose right now, esp since higher dose of citalopram has a dose-dependent re  Plan: citalopram (CELEXA) 20 MG tablet          724.5AP Chronic back pain  Comment: Likely musculoskeletal, would benefit from PT and weight loss  Plan: REFERRAL TO PHYSICAL THERAPY ( INT), naproxen         (NAPROSYN) 500 MG tablet        Also rescheduled to see nutritionist    478.19BE Nasal congestion  Comment: Improved  Plan: Can use loratadine prn      I have reviewed the past medical, surgical, social and family history and updated these sections of EpicCare as relevant. All interim labs, test results, and consult notes were reviewed and discussed with Blenda Nicely or their legal guardian/representative. Medications were reconciled during this visit and a current medication list was given to the patient at the end of the visit.

## 2009-12-28 NOTE — Progress Notes (Signed)
Counselled re importance of compliance.  Having headaches not responsive to tylenol.  Saw the neurologist about her long history of H/As.  Recommends medication to control headaches per OB and full W/U after delivery. Rx for fiorocet given.  Explained should not use with tylenol.   [facility] for cardiology consult.  Reordered.  Given Rx for BID PO Fe and refill on PNV.  C/O leaking clear fluid associated with pruritis.  SSE neg for pool, valsalva, and nitrazine.  Wet prep positive for budding hyphae.  Recommended monistat 7.  Brought 24 hour urine sample in today.  CXR ordered for today.  Pt states she no longer takes Afrin.    Elease Hashimoto Dillan Candela,MD

## 2009-12-28 NOTE — Progress Notes (Signed)
Nonspecific complaints of pregnancy.  Pt reasurred.  Has the appointment with the cardiologist on 10/23/06.  Did not receive the information as to the C/S date.  Has recently changed address.  Updated the secretary as to new address today.  Pt given preop date and time and C/S date.  Elease Hashimoto Anesa Fronek,MD

## 2009-12-28 NOTE — Progress Notes (Signed)
First visit.  Desires Wed evening clinic with Mindi Junker.  Pap/cx/labs/ppd done  Anat survey scheduled  NEEDS AFP and baseline PIH labs next visit  [email or web address]

## 2009-12-28 NOTE — Progress Notes (Signed)
Pt states the baby only moves every other day.  The last time the baby moved was the day before yesterday.  Unable to do NST at [facility] because the NST room is being used for a procedure.  Pt sent to [facility] for an NST.  Explained kick counts.  GBS done.  P C/O lower back pain.  Declines PT consult because it is not that bad.  Kimberly Hashimoto Azaan Leask,MD

## 2009-12-28 NOTE — Progress Notes (Signed)
Pt has been lost to F/U because she has been on [employer].  C/O 3 months of a stuffy nose while on Afrin.  Explain Afrin addiction.  Advised to stop Afrin and use saline spray instead.  Told to expect symptoms to get worse before they resolve.  Advise to contact PCP if symptoms do not resolve off the Afrin.  Pt set up for cardiology consult and neurology consult.  See problem list for details.  Pt has been compliant with Amytriptaline and is interested in following up with psych as recommended.  Pt wants a BTL with her repeat C/S.  Referred to family planner.  Given instructions on how to collect a 24 hour urine for total protein.  Future order placed.  Maternity consent reviewed and signed after all questions answered.  Shielded CXR ordered.  Elease Hashimoto Sha Burling,MD

## 2009-12-28 NOTE — Progress Notes (Signed)
Nonspecific C/O pregnancy.  Pt reasurred.  Went for cardiology consult.  Told there was nothing wrong with her heart.  Elease Hashimoto Dasan Hardman,MD

## 2010-01-13 ENCOUNTER — Ambulatory Visit (HOSPITAL_BASED_OUTPATIENT_CLINIC_OR_DEPARTMENT_OTHER): Payer: Medicaid Other

## 2010-01-13 DIAGNOSIS — M542 Cervicalgia: Secondary | ICD-10-CM

## 2010-01-13 DIAGNOSIS — M549 Dorsalgia, unspecified: Secondary | ICD-10-CM

## 2010-01-13 NOTE — Progress Notes (Signed)
Kimberly Lindsey  OUTPATIENT EVALUATION    REFERRING PROVIDER: Jene Every. Annye Rusk, MD  [address] [place], [place] [address]   Hx OF PRESENT ILLNESS: Pt is 29 yo female who reports h/o chronic neck and back pain that pt feels has gotten worse since pregnancy 3 years ago (date of onset for this case 10/27 referral to PT).  Pain is 8/10 in both back and neck. Pain increases with prolonged standing/working and prolonged sitting.  Pt also reports "burning" in B UTs, where bra straps hit.  Pain only gets better with pain meds and rest/laying down.  Pt reports relief of neck pain when laying supine with neck hanging over edge of bed into extension.  Pt works in Human resources officer.    Xrays + for DDD at C3/4, mild curve to R in T spine, mild curve to L in L spine   PRECAUTIONS:    MEDICATIONS (Rx Comments, concerns): For a list of current medications review the Medication activity.    LEARNS BEST: demonstration   MENTAL STATUS/COMMUNICATION: WNL     PRIMARY LANGUAGE: [place]     REQUIRES INTERPRETER: Yes   INTERPRETER PRESENT DURING EVAL: Yes   DRESSING/GROOMING: IMPAIRED: difficulty with LB dressing - has husband help     DRIVING: WNL     SLEEPING: WNL     POSTURE/ALIGNMENT: minimal increased C-T kyphosis, forward shoulders     OTHER:      NEUROLOGICAL: IMPAIRED: "burning" in neck; occasionally has numbness in UEs with sleeping position     PALPATION: diffuse TTP mid-low C spine, mid T spine and L spine, R PSIS     GAIT: WFL     SKIN INTEGRITY: WNL       Please Note: Only populated fields were assessed by provider, fields left blank were not assessed.    GIRTH LEFT RIGHT GIRTH LEFT RIGHT GIRTH LEFT RIGHT                           OTHER: Lumbar ROM: Flexion: 75% and pain in low back, extension WFL and no pain, rotation B WFL, SB WFL B    C-ROM: flexion WFL but sharp pain in posterior neck, extension WFL and no pain, rotation WFL and no pain, SB WFL and pain in posterior neck.   USE IMAGES ACTIVITY. IMAGE AVAILABLE: No      LEFT  RIGHT  LEFT RIGHT   SHOULD A/PROM MMT A/PROM MMT HIP A/PROM MMT A/PROM MMT   FLEX.     FLEX.  4-/5  3/5   EXT.     EXT.  4/5  4/5   IR     IR       ER     ER       ABD     ABD       H. ABD     ADD       H. ADD     KNEE       ELBOW     FLEX  5/5  5/5   FLEX.     EXT  4+/5  4+/5   EXT.     ANKLE       WRIST     DF       FLEX.     PF       EXT.     IV       SUP/  PRON  EV          SPECIAL TEST Pain better Pain worse SPECIAL TEST LEFT RIGHT Tightness LEFT RIGHT   Cervical traction x  spurlings - - UT x x   Lumbar traction x  SLR Pain in back only  Levator scap x x   Leg Pull x     hamstrings x x     Physical Therapy Plan of Care    OZ:DGUY K. Annye Rusk, MD  Referring Provider: Jene Every. Hsiao  Diagnosis: 724.5E Back pain  (primary encounter diagnosis)    Assessment/Objective Findings: Patient is a 29 year old female who reports long h/o neck and back pain and currently presents with pain in her bilateral Cervical spine and Lumbar Spine.  Pt reported complete relief of neck pain with cervical traction and good effect with lumbar traction and leg pull.  Pt demonstrates poor scapular strength (3 to 3+/5), poor core stability, decreased muscle length in neck, trunk and LEs, impaired posture, and general deconditioning.  Pt educated on Xray report from 10/06/09.      Pain, Decreased ROM, Decreased Strength, Decreased Functional Mobility and Decreased Tolerance of ADLs  Patient will benefit from skilled PT to address the rehabilitation goals outlined below.    Rehabilitation Goals  Pt. will demonstrate increased C and L-spine ROM by 25% throughout.   Duration: 4 weeks  Pt. will demonstrate fair postural awareness with ability to self correct  with minimal throughout.   Duration: 4 weeks  Pt. will demonstrate increase muscle length B'ly by 25% in cervical, trunk and LE musculature.   Duration: 4 weeks  Pt will demonstrate fair scapular and core stability.  4 weeks  Average neck and back pain will be reduced from a 8/10 to a 6/10.  Duration: 4 weeks  Patient to be Independent with Home Exercise Program.  Duration: 4 weeks    Long Term Goal: Pt will maximize functional potential.  8 weeks    Treatment Plan: ** Stretching/ROM Exercise  ** Therapeutic Exercise  ** Home Exercise Program  ** Joint Mobilization  ** Soft Tissue Mobilization  ** Ultrasound  ** Electrical Stimulation/TENS  ** Hot/Cold Rx  ** Manual Traction  ** Patient Education  **taping    Recommend Physical Therapy be continued 2 times per week for 4 weeks.  The rehabilitation potential for this patient is good    Patient is agreeable to treatment plan and is aware of attendance policy.    Elaina Hoops, PT

## 2010-01-13 NOTE — Progress Notes (Addendum)
I certify that the documented Treatment Plan is reasonable and necessary.    01/13/2010  Jene Every. Annye Rusk, MD

## 2010-01-13 NOTE — Patient Instructions (Addendum)
Rehabilitation Treatment Flowsheet    Precautions:    Date: 01/13/10         Initials: AM         Visit #: 1         POC Due Date:          Time:          HEP issued         Treatment(s)          Eval done

## 2010-01-18 ENCOUNTER — Ambulatory Visit (HOSPITAL_BASED_OUTPATIENT_CLINIC_OR_DEPARTMENT_OTHER): Payer: Medicaid Other

## 2010-01-18 DIAGNOSIS — M542 Cervicalgia: Secondary | ICD-10-CM

## 2010-01-18 DIAGNOSIS — M549 Dorsalgia, unspecified: Secondary | ICD-10-CM

## 2010-01-18 NOTE — Patient Instructions (Addendum)
Rehabilitation Treatment Flowsheet    Precautions:    Date: 01/13/10 01/18/10        Initials: AM AM        Visit #: 1 2        POC Due Date:          Time:          HEP issued         Treatment(s)          Eval done         Cervical traction  x        Lumbar traction with belt  x        Leg pull  B'ly        Manual UT stretching  B'ly        Core strengthening  TA bracing with adductor squeeze, with clamshell with YTB x 10 each        Scapular strengthening  Prone scap squeezes, T therex x 10 each        Neck stabilization

## 2010-01-18 NOTE — Progress Notes (Signed)
.    S: Pt reports 5/10 pain in neck and low back  O: Refer to Rehabilitation Treatment Flowsheet  A: Pt tolerated treatment well.  Reports good effect with cervical traction and leg pull for pain relief. Requires min cues for scapular therex and core stabilization.  Pt fatigues with core therex. Note more tightness of L UT as pt reported minimal pain and decreased ROM.  P: Continue with current POC

## 2010-01-25 ENCOUNTER — Ambulatory Visit (HOSPITAL_BASED_OUTPATIENT_CLINIC_OR_DEPARTMENT_OTHER): Payer: Medicaid Other | Admitting: Rehabilitative and Restorative Service Providers"

## 2010-01-28 ENCOUNTER — Other Ambulatory Visit (HOSPITAL_BASED_OUTPATIENT_CLINIC_OR_DEPARTMENT_OTHER): Payer: Medicaid Other | Admitting: Family Medicine

## 2010-02-01 ENCOUNTER — Telehealth (HOSPITAL_BASED_OUTPATIENT_CLINIC_OR_DEPARTMENT_OTHER): Payer: Self-pay | Admitting: Rehabilitative and Restorative Service Providers"

## 2010-02-01 ENCOUNTER — Ambulatory Visit (HOSPITAL_BASED_OUTPATIENT_CLINIC_OR_DEPARTMENT_OTHER): Payer: Medicaid Other | Admitting: Rehabilitative and Restorative Service Providers"

## 2010-02-01 NOTE — Telephone Encounter (Signed)
Telephone call to patient regarding missed appointment today. Confirmed next appointment on Wednesday 02/03/10 @ 5pm.  Patient states she will attend.

## 2010-02-03 ENCOUNTER — Ambulatory Visit (HOSPITAL_BASED_OUTPATIENT_CLINIC_OR_DEPARTMENT_OTHER): Payer: Medicaid Other | Admitting: Rehabilitative and Restorative Service Providers"

## 2010-02-03 ENCOUNTER — Telehealth (HOSPITAL_BASED_OUTPATIENT_CLINIC_OR_DEPARTMENT_OTHER): Payer: Self-pay | Admitting: Rehabilitative and Restorative Service Providers"

## 2010-02-03 NOTE — Telephone Encounter (Signed)
Telephone call via [place] interpreter regarding missed appointment today. Next scheduled appointment is 12/12 Monday @ 2pm. Left message to call [phone] regarding question her plan to continue with PT.

## 2010-02-08 ENCOUNTER — Telehealth (HOSPITAL_BASED_OUTPATIENT_CLINIC_OR_DEPARTMENT_OTHER): Payer: Self-pay | Admitting: Rehabilitative and Restorative Service Providers"

## 2010-02-08 ENCOUNTER — Ambulatory Visit (HOSPITAL_BASED_OUTPATIENT_CLINIC_OR_DEPARTMENT_OTHER): Payer: Medicaid Other | Admitting: Rehabilitative and Restorative Service Providers"

## 2010-02-08 NOTE — Telephone Encounter (Signed)
Telephone call to patient via [place] interpreter, she is discharged from [facility]PP[facility].  She has missed six consecutive appointments.  This is the third outreach telephone call made to her.

## 2010-02-10 ENCOUNTER — Ambulatory Visit (HOSPITAL_BASED_OUTPATIENT_CLINIC_OR_DEPARTMENT_OTHER): Payer: Medicaid Other | Admitting: Rehabilitative and Restorative Service Providers"

## 2010-02-18 ENCOUNTER — Encounter (HOSPITAL_BASED_OUTPATIENT_CLINIC_OR_DEPARTMENT_OTHER): Payer: Medicaid Other | Admitting: Registered"

## 2010-06-22 ENCOUNTER — Emergency Department (HOSPITAL_BASED_OUTPATIENT_CLINIC_OR_DEPARTMENT_OTHER)
Admission: RE | Admit: 2010-06-22 | Disposition: A | Discharge status: Other Acute Inpatient Hospital | Payer: Self-pay | Source: Emergency Department | Attending: Physician Assistant | Admitting: Physician Assistant

## 2010-06-22 ENCOUNTER — Encounter (HOSPITAL_BASED_OUTPATIENT_CLINIC_OR_DEPARTMENT_OTHER): Payer: Self-pay

## 2010-06-22 HISTORY — DX: Adjustment disorder with mixed anxiety and depressed mood: F43.23

## 2010-06-22 HISTORY — DX: Obesity, unspecified: E66.9

## 2010-06-22 LAB — URINE DIP (POINT OF CARE)
BILIRUBIN, URINE: NEGATIVE
GLUCOSE, URINE: NEGATIVE mg/dl
KETONE, URINE: NEGATIVE mg/dl
LEUKOCYTE ESTERASE: NEGATIVE
NITRITE, URINE: NEGATIVE
PH URINE: 5.5 (ref 5.0–8.0)
PROTEIN, URINE: NEGATIVE mg/dl (ref 0–15)
SPECIFIC GRAVITY URINE: 1.025 (ref 1.003–1.030)
UROBILINOGEN URINE: 0.2 mg/dl (ref 0.2–1.0)

## 2010-06-22 LAB — URINE PREGNANCY TEST (POINT OF CARE): HCG QUALITATIVE URINE: NEGATIVE

## 2010-06-22 MED ORDER — KETOROLAC TROMETHAMINE 60 MG/2ML IM SOLN
60.00 mg | Freq: Once | INTRAMUSCULAR | Status: AC
Start: 2010-06-22 — End: 2010-06-22
  Administered 2010-06-22: 60 mg via INTRAMUSCULAR
  Filled 2010-06-22: qty 2

## 2010-06-22 NOTE — ED Triage Note (Signed)
PT C/O LOWER BACK PAIN THAT STARTED SUN AND GOT WORSE YEST. POINTING MID LOWER BACK RADIATING INTO BOTH SIDES OF HER BUM AND DOWN THE BACK OF HER LEGS. DENIES ANY INJURY.

## 2010-06-22 NOTE — ED Notes (Signed)
Trans out via cataldo

## 2010-06-22 NOTE — ED Notes (Signed)
Alert cooperative sitting up at bedside got self dressed  [facility] be trans [facility]

## 2010-06-22 NOTE — ED Provider Progress Note (Signed)
Patient received Toradol IM in ER prior to transfer to [facility] for MRI evaluation.

## 2010-06-22 NOTE — ED Notes (Signed)
Report to [facility] awaiting cataldo

## 2010-06-23 NOTE — ED Notes (Signed)
Date of Visit: 06/22/2010      Nursing note reviewed.  Medication list reviewed.  Used a [place] interpreter to interview patient.      CHIEF COMPLAINT:  Back pain for the past 3 days.    HISTORY OF PRESENT ILLNESS:  The patient, 30 year old female who presents to Emergency Room complaining of back pain for the past 3 days.  Patient states that has a long history of back pain.  When she was young she fell and had a serious back injury which required her to take pain medicine for a long time.  States that she has had back issues since then, but over the past 3 days has had the most intense back pain she has ever had.  It has never been this painful.  Reports that she feels pain shoot down both of her thighs in the back almost down to her knees.  States that that pain is intermittent, sometimes it is at rest, sometimes it is when she moves.  Pain starts right centrally in the middle of her back.  She states she does notice alternating numbness in her left and right leg.  This has been on and off for the past several years, but seems to be more often over the past few days.  Denies any weakness in her legs.  Denies any loss of bowel or bladder function, but states it is difficult to make bowel movements because of pain.  Denies any fever, chills, nausea or vomiting.    PAST MEDICAL HISTORY:  Anxiety, asthma, PPD positive, adjustment disorder, obesity.    SOCIAL HISTORY:  As per nursing chart.      FAMILY HISTORY:  As per nursing chart.    ALLERGIES:  No known allergies.    REVIEW OF SYSTEMS:  All negative except as specified in HPI.    PHYSICAL EXAMINATION:  VITAL SIGNS:  Reviewed and unremarkable.  GENERAL:  No acute distress.  Awake, alert, oriented x3.  SKIN:  No apparent rashes, moist mucous membranes.  HEAD:  Atraumatic, normocephalic.  EYES:  Extraocular movements intact.  Pupils equal, round, reactive to light.  ENT:  Throat:  Patent airway.  RESPIRATORY:  Lungs clear to auscultation  bilaterally.  CARDIOVASCULAR:  Regular rate and rhythm, normal S1, S2.  GASTROINTESTINAL:  Soft, nontender abdomen.  No rebound.  MUSCULOSKELETAL:  Lumbar back tender along lumbosacral spine.  No step offs or gross deformity, no crepitus, no ecchymosis, erythema.  Paraspinous muscles tender cannot flex at the waist due to pain.  Distal strength is intact.  Distal pulses are intact.  Deep tendon reflexes are intact bilaterally in the knees. Anal sphincter tone is intact.  Involuntary sphincter tone is intact.  The patient is able to ambulate in the Emergency.    EMERGENCY DEPARTMENT COURSE:  Based on history and exam, patient appears to have worsening back pain with a long history.  The patient states the back pain is radiating down both legs, which it has not done in the past.  The patient given dose of Toradol IM in the Emergency Room with some relief of symptoms.  Disscused case with Dr. Logan Bores and due to possible cord compression the patient will be transferred to [facility] for MRI evaluation due to bilateral leg radiation of low back pain.  I discussed case with the intake nurse, and patient will be seen by Dr Ladell Pier at the [facility] Emergency Room.  The patient is stable on transfer and sent by BLS.  DIAGNOSIS:  Back pain radiating to both legs.    DISPOSITION:  Transferred to [facility] in stable condition.    ___________________________  Reviewed and Electronically Signed By: Malena Peer PA-C  Sig Date: 06/26/2010  Sig Time: 01:22:44  Dictated By: Malena Peer PA-C  Dict Date: 06/23/2010 Dict Time: 02 58 AM    Dictation Date and Time:06/23/2010 02:58:48  Transcription Date and Time:06/23/2010 03:50:13  eScription Dictation id: [id number] Confirmation # :[id number]    DICTATED BY: Magdalene River DAAM PA-CPERCIVAL C VAN DAAM PA-CD:06/23/2010 02:58:48 T:06/23/2010 03:50:13 TF Job#: [id number]

## 2010-08-13 ENCOUNTER — Encounter (HOSPITAL_BASED_OUTPATIENT_CLINIC_OR_DEPARTMENT_OTHER): Payer: Self-pay | Admitting: Family Medicine

## 2010-08-13 ENCOUNTER — Ambulatory Visit (HOSPITAL_BASED_OUTPATIENT_CLINIC_OR_DEPARTMENT_OTHER): Payer: Medicaid Other | Admitting: Family Medicine

## 2010-08-13 VITALS — BP 100/80 | HR 96 | Temp 97.7°F | Ht 60.0 in | Wt 195.5 lb

## 2010-08-13 DIAGNOSIS — M549 Dorsalgia, unspecified: Secondary | ICD-10-CM

## 2010-08-13 DIAGNOSIS — R002 Palpitations: Secondary | ICD-10-CM

## 2010-08-13 DIAGNOSIS — F4323 Adjustment disorder with mixed anxiety and depressed mood: Secondary | ICD-10-CM

## 2010-08-13 MED ORDER — DICLOFENAC POTASSIUM 50 MG PO TABS
50.0000 mg | ORAL_TABLET | Freq: Two times a day (BID) | ORAL | Status: DC | PRN
Start: 2010-08-13 — End: 2011-10-20

## 2010-08-13 NOTE — Progress Notes (Signed)
Kimberly Lindsey is a 30 year old female  CC:   Patient presents with:    Back Pain    Weight Loss      724.5E Back pain  (primary encounter diagnosis)  Patient was seen in 05/2010 at the ED and they were concerned about possible cord compression.   Per their note:    HISTORY OF PRESENT ILLNESS: The patient, 30 year old female who presents to Emergency Room complaining of back pain for the past 3 days. Patient states that has a long history of back pain. When she was young she fell and had a serious back injury which required her to take pain medicine for a long time. States that she has had back issues since then, but over the past 3 days has had the most intense back pain she has ever had. It has never been this painful. Reports that she feels pain shoot down both of her thighs in the back almost down to her knees. States that that pain is intermittent, sometimes it is at rest, sometimes it is when she moves. Pain starts right centrally in the middle of her back. She states she does notice alternating numbness in her left and right leg. This has been on and off for the past several years, but seems to be more often over the past few days. Denies any weakness in her legs. Denies any loss of bowel or bladder function, but states it is difficult to make bowel movements because of pain. Denies any fever, chills, nausea or vomiting.    EMERGENCY DEPARTMENT COURSE: Based on history and exam, patient appears to have worsening back pain with a long history. The patient states the back pain is radiating down both legs, which it has not done in the past. The patient given dose of Toradol IM in the Emergency Room with some relief of symptoms. Disscused case with Dr. Logan Bores and due to possible cord compression the patient will be transferred to [facility] for MRI evaluation due to bilateral leg radiation of low back pain. I discussed case with the intake nurse, and patient will be seen by Dr Ladell Pier at the [facility]  Emergency Room. The patient is stable on transfer and sent by BLS.    She was sent to [facility], but from what I can tell from the [facility] notes they were not concerned for this and she was recommended to see an orthopedic doctor.  She has not done this yet.    She asked someone to send her some medications from [place], it was voltaren, it helped and her legs got better.  She continues to have occasional pain, now just going down L leg, and has numbness sometimes as well.  Overall pain has gotten better but pain still comes and goes sometimes, just in the lower back and L leg.    She thinks that the problem is her weight, she has tried to lose weight    Most Recent Weight Reading(s)  06/22/10 : 190 lb (86.183 kg)  12/24/09 : 197 lb (89.359 kg)  10/23/09 : 193 lb (87.544 kg)  10/02/09 : 192 lb (87.091 kg)  05/19/09 : 197 lb (89.359 kg)    As for red flag signs, patient denies fevers, prior history of cancer, bowel/bladder incontinence or saddle anesthesia, severe worsening of pain, unintended weight loss, or pain that awakens at night.        309.28 Adjustment disorder with mixed anxiety and depressed mood  785.1 Palpitations  Previously I  dx'ed mild depression and possible anxiety with palpitations.  PhQ-9 score today is 3  She was given citalopram and propranolol and she never tried either  Says all her sx are better  No more palpitations      BP 100/80   Pulse 96   Temp(Src) 97.7 F (36.5 C) (Temporal)   Ht 5' (1.524 m)   Wt 195 lb 8 oz (88.678 kg)   BMI 38.18 kg/m2   LMP 08/04/2010  Pain Score: 5 (5/10)        Physical Exam   Constitutional: Patient is oriented to person, place, and time. Patient appears well-developed and well-nourished. No distress.   HENT:   Head: Normocephalic and atraumatic.   Eyes: Conjunctivae are normal.   Neck: Normal range of motion.   Musculoskeletal: Patient appears to be in mild to moderate pain, antalgic gait noted.  Inspection of the back with no deformity or asymmetry.    Lumbosacral  spine area reveals no local tenderness or mass.   Painful and reduced LS ROM noted.   Paraspinal muscle tenderness noted at the lumbar level.  Straight leg raise is negative bilat  Peripheral pulses are palpable.   Neuro: DTRs (patella for L3-L4, achilles for L5-S1), motor strength normal in the lower extremities bilaterally, including heel (L4) and toe gait (S1) as well as resisted dorsiflexion of toes (L5) bilaterally.  Sensation intact at the medial ankle (L4), great toe (L5), and lateral foot (S1) bilaterally.  Psychiatric: Patient has a normal mood and affect. Behavior is normal. Judgment and thought content normal.     Holter 10/2009 was normal overall    Estimated Body mass index is 38.18 kg/(m^2) as calculated from the following:    Height as of this encounter: 5\' 0" (1.524 m).    Weight as of this encounter: 195 lb 8 oz(88.678 kg).    724.5E Back pain  (primary encounter diagnosis)  Comment: I didn't see any red flag signs and her sx are getting better with doing exercises on her own.  Still, given the presentation she had in 05/2010, I think that a 2nd opinion for her back pain would be warranted.  I think she would benefit from PT  Plan: REFERRAL TO PHYSIATRY ( INT), diclofenac         (VOLTAREN) 50 MG tablet, REFERRAL TO PHYSICAL         THERAPY ( INT)        Can use voltaren 50mg  BID prn, use with food.  She can pick this up when she runs out of her meds from [place]    309.28 Adjustment disorder with mixed anxiety and depressed mood  Comment: Patient's sx have resolved  Plan: No intervention needed for now, will continue to monitor    785.1 Palpitations  Comment: Resolved  Plan: No further workup needed at this time      I have spent 25 minutes in face to face time with this patient/patient proxy of which > 50% was in counseling or coordination of care regarding above issues/Dx.      I have reviewed the past medical, surgical, social and family history and updated these sections of EpicCare as relevant.  All interim labs, test results, and consult notes were reviewed and discussed with Blenda Nicely or their legal guardian/representative. Medications were reconciled during this visit and a current medication list was given to the patient at the end of the visit.

## 2010-08-19 ENCOUNTER — Ambulatory Visit (HOSPITAL_BASED_OUTPATIENT_CLINIC_OR_DEPARTMENT_OTHER): Payer: Medicaid Other | Admitting: Rehabilitative and Restorative Service Providers"

## 2010-08-19 ENCOUNTER — Encounter (HOSPITAL_BASED_OUTPATIENT_CLINIC_OR_DEPARTMENT_OTHER): Payer: Medicaid Other | Admitting: Rehabilitative and Restorative Service Providers"

## 2010-10-14 ENCOUNTER — Ambulatory Visit (HOSPITAL_BASED_OUTPATIENT_CLINIC_OR_DEPARTMENT_OTHER): Payer: Medicaid Other | Admitting: Physical Medicine & Rehabilitation

## 2011-07-14 ENCOUNTER — Ambulatory Visit (HOSPITAL_BASED_OUTPATIENT_CLINIC_OR_DEPARTMENT_OTHER): Payer: Medicaid Other | Admitting: Physician Assistant

## 2011-07-14 ENCOUNTER — Encounter (HOSPITAL_BASED_OUTPATIENT_CLINIC_OR_DEPARTMENT_OTHER): Payer: Self-pay | Admitting: Physician Assistant

## 2011-07-14 VITALS — BP 135/90 | HR 107 | Temp 97.8°F | Wt 205.0 lb

## 2011-07-14 DIAGNOSIS — IMO0001 Reserved for inherently not codable concepts without codable children: Secondary | ICD-10-CM

## 2011-07-14 DIAGNOSIS — E669 Obesity, unspecified: Secondary | ICD-10-CM

## 2011-07-14 DIAGNOSIS — R002 Palpitations: Secondary | ICD-10-CM

## 2011-07-14 DIAGNOSIS — M79673 Pain in unspecified foot: Secondary | ICD-10-CM

## 2011-07-14 LAB — CBC, PLATELET & DIFFERENTIAL
ABSOLUTE BASO COUNT: 0 10*3/uL (ref 0.0–0.1)
ABSOLUTE EOSINOPHIL COUNT: 0.1 10*3/uL (ref 0.0–0.8)
ABSOLUTE IMM GRAN COUNT: 0.01 10*3/uL (ref 0.00–0.03)
ABSOLUTE LYMPH COUNT: 1.9 10*3/uL (ref 0.6–5.9)
ABSOLUTE MONO COUNT: 0.4 10*3/uL (ref 0.2–1.4)
ABSOLUTE NEUTROPHIL COUNT: 4.9 10*3/uL (ref 1.6–8.3)
BASOPHIL %: 0.3 % (ref 0.0–1.2)
EOSINOPHIL %: 1.8 % (ref 0.0–7.0)
HEMATOCRIT: 37.2 % (ref 34.1–44.9)
HEMOGLOBIN: 12 g/dL (ref 11.2–15.7)
IMMATURE GRANULOCYTE %: 0.1 % (ref 0.0–0.4)
LYMPHOCYTE %: 25.6 % (ref 15.0–54.0)
MEAN CORP HGB CONC: 32.3 g/dL (ref 31.0–37.0)
MEAN CORPUSCULAR HGB: 28.8 pg (ref 26.0–34.0)
MEAN CORPUSCULAR VOL: 89.2 fL (ref 80.0–100.0)
MEAN PLATELET VOLUME: 12 fL (ref 8.7–12.5)
MONOCYTE %: 5.8 % (ref 4.0–13.0)
NEUTROPHIL %: 66.4 % (ref 40.0–75.0)
PLATELET COUNT: 258 10*3/uL (ref 150–400)
RBC DISTRIBUTION WIDTH STD DEV: 39.6 fL (ref 35.1–46.3)
RBC DISTRIBUTION WIDTH: 12.5 % (ref 11.5–14.3)
RED BLOOD CELL COUNT: 4.17 M/uL (ref 3.90–5.20)
WHITE BLOOD CELL COUNT: 7.4 10*3/uL (ref 4.0–11.0)

## 2011-07-14 NOTE — Progress Notes (Signed)
Kimberly Lindsey is a 31 year old female who presents to the office complaining of the following:    Patient presents with:    Chest Pain - 2 nights ago - had chest/back/ R arm pain also back of head    Foot Pain    Knee Pain    Questions - thyroid check, weight loss    [place] interpreter was used throughout the entire appointment       Palpitations:  (+) palpitations on and off for the past 3-4 days; gets palpitations about twice a week.  Squeezing chest pain radiating down her right arm 2 days ago; she was in her bed trying to fall asleep when she started feeling the chest pain.  Drank mixture of water baking powder with lemon and water and pain went away within couple hrs pain went away. This chest pain is associated with SOB and palpitations.  No h/o acid reflux  Never had this kid of chest pain in the past.  No associated with chest or SOB.  Had mild dizziness when she had palpitations today, otherwise no dizziness  (+) h/o palpitations in the past.      Gets pain in her heels when she walks outside for about 10 minutes  Pain is worse when she wears flat shoes  Denies starting wearing new shoes  No pain when she walks at home  No pain in her heals when she does not walk.  Also, since she started having pains in her heals with walking, she also started having pain in her knees when she walks as well.  No pain in her knees when she does not walk.        Most Recent Weight Reading(s)  07/14/11 : 205 lb (92.987 kg)  08/13/10 : 195 lb 8 oz (88.678 kg)  06/22/10 : 190 lb (86.183 kg)  12/24/09 : 197 lb (89.359 kg)  10/23/09 : 193 lb (87.544 kg)        BP 135/90   Pulse 107   Temp(Src) 97.8 F (36.6 C) (Temporal)   Wt 205 lb (92.987 kg)   BMI 40.04 kg/m2   SpO2 100%   LMP 07/14/2011     Physical Exam   Nursing note and vitals reviewed.  Constitutional: She appears well-developed and well-nourished. No distress.   HENT:   Mouth/Throat: Oropharynx is clear and moist. No oropharyngeal exudate.   Eyes: Conjunctivae are  normal. Right eye exhibits no discharge. Left eye exhibits no discharge. No scleral icterus.   Neck: Neck supple. No JVD present. No tracheal deviation present. No thyromegaly present.   Cardiovascular: Normal rate, regular rhythm and normal heart sounds.  Exam reveals no gallop and no friction rub.    No murmur heard.  Pulmonary/Chest: Effort normal and breath sounds normal. No respiratory distress. She has no wheezes. She has no rales.   Musculoskeletal: Normal range of motion. She exhibits no tenderness.   Knees: no swelling; non-tender; no deformity; normal active ROM b/l    Right foot/right heal: no erythema, non-tender, no swelling  Left foot/left heal: no erythema, non-tender, no swelling   Lymphadenopathy:     She has no cervical adenopathy.   Neurological: She is alert.   Skin: Skin is warm. She is not diaphoretic.       (785.1) Palpitations  (primary encounter diagnosis)  Comment: cardiac vs. Other cause  Plan: 1) REFERRAL TO CARDIOLOGY ( INT),             2) EKG -  NSR, no acute ST changes.           3) THYROID SCREEN TSH, COMPREHENSIVE METABOLIC PANEL, CBC + PLT + AUTO DIFF, COLLECTION VENOUS BLOOD VENIPUNCTURE      (278.00) Obesity  Comment: wants to loose weight, but not really watches her diet. Saw nutritionist in the past.  Plan: INSULIN FREE AND TOTAL         (729.5) Heel pain  Comment: plantar facialis?   Plan: REFERRAL TO PODIATRY ( INT)       (796.2) Elevated BP  Comment: Most Recent BP Reading(s)  07/14/11 : 135/90  08/13/10 : 100/80  06/22/10 : 114/59  12/24/09 : 110/80  10/23/09 : 124/78    Plan: 1) Will follow up with PCP.

## 2011-07-15 LAB — COMPREHENSIVE METABOLIC PANEL
ALANINE AMINOTRANSFERASE: 30 IU/L (ref 7–35)
ALBUMIN: 3.9 g/dl (ref 3.4–4.8)
ALKALINE PHOSPHATASE: 84 IU/L (ref 25–106)
ANION GAP: 9 mmol/L (ref 3–11)
ASPARTATE AMINOTRANSFERASE: 32 IU/L (ref 8–34)
BILIRUBIN TOTAL: 0.5 mg/dl (ref 0.2–1.1)
BUN (UREA NITROGEN): 7 mg/dl (ref 6–20)
CALCIUM: 8.9 mg/dl (ref 8.6–10.3)
CARBON DIOXIDE: 25 mmol/L (ref 22–32)
CHLORIDE: 102 mmol/L (ref 101–111)
CREATININE: 0.7 mg/dl (ref 0.4–1.2)
ESTIMATED GLOMERULAR FILT RATE: >60 mL/min (ref >60)
Glucose Random: 112 mg/dl (ref 74–160)
POTASSIUM: 4.6 mmol/L (ref 3.5–5.1)
SODIUM: 136 mmol/L (ref 135–144)
TOTAL PROTEIN: 7.1 g/dl (ref 5.9–7.5)

## 2011-07-15 LAB — THYROID SCREEN TSH REFLEX FT4: THYROID SCREEN TSH REFLEX FT4: 1.83 u[IU]/mL (ref 0.34–5.60)

## 2011-07-16 LAB — EKG

## 2011-07-18 LAB — INSULIN FREE AND TOTAL
INSULIN FREE: 26.5 u[IU]/mL — AB (ref 0.0–22.0)
INSULIN TOTAL: 42.1 u[IU]/mL — AB (ref 0.0–22.0)

## 2011-07-28 ENCOUNTER — Telehealth (HOSPITAL_BASED_OUTPATIENT_CLINIC_OR_DEPARTMENT_OTHER): Payer: Self-pay

## 2011-07-28 NOTE — Progress Notes (Signed)
Patient was contacted to confirm appointment for Jun 3 @ 9:00am.  Int left message on pts vm.  Directions to the office reviewed.   I asked them to bring a medication list or all their medication bottles so we can confirm an accurate list in their medical record.  No questions at this time.  I asked that they call the office if they decide not to come and we would be happy to reschedule the appointment.

## 2011-08-01 ENCOUNTER — Ambulatory Visit (HOSPITAL_BASED_OUTPATIENT_CLINIC_OR_DEPARTMENT_OTHER): Payer: Medicaid Other | Admitting: Internal Medicine

## 2011-08-01 VITALS — BP 114/67 | HR 85 | Wt 205.0 lb

## 2011-08-01 DIAGNOSIS — E669 Obesity, unspecified: Secondary | ICD-10-CM

## 2011-08-01 DIAGNOSIS — R002 Palpitations: Secondary | ICD-10-CM

## 2011-08-01 MED ORDER — METOPROLOL TARTRATE 25 MG PO TABS
12.5000 mg | ORAL_TABLET | Freq: Two times a day (BID) | ORAL | Status: DC
Start: 2011-08-01 — End: 2011-10-20

## 2011-08-01 NOTE — Progress Notes (Signed)
Date of Service: 08/01/2011    August 01, 2011    Sharyl Nimrod, MD  [facility]  [address], [place] [address]    RE:  Charlei Ramsaran.    Dear Dr Jerene Dilling:    I am seeing your patient, Angelee Bahr, in cardiology followup for palpitation.    As you remember, she is a 31 year old female who has a history of obesity and had been complaining of palpitations for a couple years now.  Her prior workup includes an echocardiogram done in 2008, which was normal, and the Holter monitoring in September 2011, which also was normal.  At that time, the patient activated the device twice to indicate symptoms; however, there was no evidence of arrhythmia at that time.    Shineka continues to complain about palpitations once or twice a week.  They are not related to physical activity.  They usually last a minute, and they are associated with some light chest pressure.  She denies any lightheadedness or syncope with the palpitations.    CARDIAC REVIEW OF SYSTEMS:  She denies any exertional symptoms of chest pressure.  She has palpitation with being active at the gym, as well.  She denies PND, orthopnea, syncope, claudication.    GENERAL REVIEW OF SYSTEMS:  Was done and positive for feeling fatigued.  Exercising at the gym led to feet and knee pain, and she stopped.  All other systems are reviewed and negative.    PAST MEDICAL HISTORY:  Obesity.    SOCIAL AND FAMILY HISTORY:  Mother is alive and has palpitations.  Father died at age 22 of CAD.  No history of premature coronary artery disease in her family.    SOCIAL HISTORY:  Originally from [place].  She came here 10 years ago.  She gained over 80 pounds since she moved here.  She is married with 3 sons.  She does not smoke.  She drinks no alcohol.  She drinks a cup of coffee a day and multiple decaffeinated teas.    ALLERGIES:  None.    CURRENT MEDICATIONS:  None.    PHYSICAL EXAMINATION:  GENERAL:  Young female, overweight, in no acute distress.  VITAL SIGNS:  Blood pressure is  114/67, pulse 85, oxygen saturation 98%, BMI of 40.  HEENT:  Shows moist mucosal membrane, anicteric sclerae, extraocular movement intact.  NECK:  Supple, without JVD or carotid bruits.  LUNGS:  Clear to auscultation bilaterally.  CARDIAC:  Normal S1, S2; normal exam.  ABDOMEN:  Obese but soft, nontender, nondistended.  EXTREMITIES:  Free of edema.  No skin changes are noted.  NEUROLOGIC:  No obvious neurologic deficit is present.    LABORATORY DATA:  I have reviewed her last electrocardiogram, which was done on May 16, which was normal.    Her last TSH was also normal.    SUMMARY:  Shaketha is a 31 year old female with obesity who is complaining of benign palpitation symptoms.  They are never associated with lightheadedness, dizziness, syncope, and they only last a minute.    For further evaluation, a repeat Holter monitor will be done.    To evaluate her exercise tolerance prior to beginning of an exercise program and to evaluate if her chest pressure associated with the palpitations is due to underlying CAD, a treadmill test will be done.    For symptomatic relief of the palpitation, I sent in a prescription for 12.5 mg of metoprolol to be taken twice a day.    I will  see Azaliah back in my clinic in 6 weeks.  I spent 50% of the time counseling on losing weight, healthy diet, and exercise.    Thank you very much for sending her to me.    Sincerely,    ___________________________  Reviewed and Electronically Signed By: Rolm Baptise MD  Sig Date: 08/05/2011  Sig Time: 14:28:04  Dictated By: Rolm Baptise MD  Dict Date: 08/01/2011 Dict Time: 10 09 AM    Dictation Date and Time:08/01/2011 10:09:15  Transcription Date and Time:08/01/2011 11:22:36  eScription Dictation id: [id number] Confirmation # :[id number]      cc: Sharyl Nimrod MD    DICTATED BY: Rolm Baptise MDDANIELA Giovanny Dugal MDD:08/01/2011 10:09:15 T:08/01/2011 11:22:36 1N Job#: [id number]

## 2011-08-01 NOTE — Progress Notes (Signed)
Primary note dictated through E-Scription.  Note filed in Chart Review under the "Notes" tab or "Letters" tab, as a Medical Specialties Note or Letter.      MT ACCT #:  [id number]

## 2011-08-01 NOTE — Progress Notes (Signed)
The patient stated that:    She is feeling well.  She has complaints.    She is eating well.  She is drinking well.  She is not sleeping well.  Pt falls asleep and wakes up from a suffocating feeling at times.    She does not have a problem with bowels.  She does not have a problem with urination.    She does feel safe at home in regards to potential falling.  She does feel safe at home in regards to personal safety.    Additional assessment notes.

## 2011-08-04 ENCOUNTER — Ambulatory Visit (HOSPITAL_BASED_OUTPATIENT_CLINIC_OR_DEPARTMENT_OTHER): Payer: Medicaid Other | Admitting: Family Medicine

## 2011-08-08 ENCOUNTER — Ambulatory Visit
Admit: 2011-08-08 | Discharge: 2011-08-08 | Disposition: A | Discharge status: Home / Self Care | Payer: Self-pay | Attending: Internal Medicine | Admitting: Internal Medicine

## 2011-08-08 ENCOUNTER — Ambulatory Visit (HOSPITAL_BASED_OUTPATIENT_CLINIC_OR_DEPARTMENT_OTHER): Payer: Medicaid Other | Admitting: Podiatrist

## 2011-09-12 ENCOUNTER — Encounter (HOSPITAL_BASED_OUTPATIENT_CLINIC_OR_DEPARTMENT_OTHER): Payer: Self-pay | Admitting: Internal Medicine

## 2011-09-12 ENCOUNTER — Ambulatory Visit (HOSPITAL_BASED_OUTPATIENT_CLINIC_OR_DEPARTMENT_OTHER): Payer: Medicaid Other | Admitting: Internal Medicine

## 2011-09-12 VITALS — BP 130/64 | HR 91 | Wt 198.0 lb

## 2011-09-12 DIAGNOSIS — E669 Obesity, unspecified: Secondary | ICD-10-CM

## 2011-09-12 DIAGNOSIS — R002 Palpitations: Secondary | ICD-10-CM

## 2011-09-12 MED ORDER — METOPROLOL SUCCINATE ER 25 MG PO TB24
12.5000 mg | ORAL_TABLET | Freq: Every day | ORAL | Status: DC
Start: 2011-09-12 — End: 2011-10-20

## 2011-09-12 NOTE — Progress Notes (Signed)
Primary note dictated through E-Scription.  Note filed in Chart Review under the "Notes" tab or "Letters" tab, as a Medical Specialties Note or Letter.      MT ACCT #:  [id number]

## 2011-09-12 NOTE — Progress Notes (Signed)
Here for follow up of papiltations. Patients states that since has started medication prescribed by Dr Biagio Borg the palpitations have subsided

## 2011-09-12 NOTE — Progress Notes (Signed)
Date of Service: 09/12/2011    September 12, 2011    Kimberly Nimrod, MD  [address], [place]  [address]    RE:  Kimberly Lindsey    Dear Kimberly Lindsey:    I  am seeing Kimberly Lindsey in a cardiology followup visit.      I have seen her first time on August 01, 2011 for a complaint of palpitations.  I have started her on low dose metoprolol 12.5 mg twice a day.  Since she started taking metoprolol, Kimberly Lindsey feels remarkably well.  She denies any recurrent palpitations.  She denies chest pain, shortness of breath, PND, orthopnea or lower extremity swelling.  She did not have any dizziness or lightheadedness secondary to the metoprolol.    REVIEW OF SYSTEMS:  Was done and was positive for foot pain, for which she will have an appointment with you.  All other systems are reviewed and negative.    ALLERGIES:  None.    CURRENT MEDICATIONS:  Metoprolol XL 12.5 mg twice a day and Tylenol as needed.    PHYSICAL EXAMINATION:  GENERAL:  A young female in no acute distress.  VITAL SIGNS:  Blood pressure 130/64, pulse 91, oxygen saturation 100%.  HEENT:  Shows moist mucosa membranes, anicteric sclerae.  Extraocular movement intact.  NECK:  Supple, without JVD or carotid bruits.  LUNGS:  Clear to auscultation bilaterally.  CARDIOVASCULAR:  Normal S1, S2, no murmurs.  ABDOMEN:  Obese but soft, nontender.  EXTREMITIES:  Free of edema.  No skin changes are noted.  NEUROLOGIC:  Normal.    ASSESSMENT AND PLAN:  In summary, Kimberly Lindsey is a 31 year old female who is complaining of palpitations, which got relieved by low dose of metoprolol 12.5 mg twice a day.  She was also supposed to come in for a treadmill test, since she was complaining about chest pressure associated with palpitations.  She did not show up for the test; however at this point, since the palpitations are under control, she is having no chest discomfort.    I sent in a prescription for a 12.5 metoprolol extended release form twice a day.  I hope that the extended release form will last a little bit  longer in the system and control her symptoms better.  I have advised her if she has breakthrough palpitation, she can increase the dose to 25 mg twice a day.    I am very excited she managed to lose 7 pounds in a couple weeks since last visit with me.  She started walking more and reduced her food intake.  I spent 50% of the time counseling on weight loss.    I will see Kimberly Lindsey in 6 months.  She has to lose 20 pounds by the next visit with me.    Thank you very much for allowing me to participate in her care.    Sincerely,    ___________________________  Reviewed and Electronically Signed By: Kimberly Baptise MD  Sig Date: 09/12/2011  Sig Time: 15:38:47  Dictated By: Kimberly Baptise MD  Dict Date: 09/12/2011 Dict Time: 09 44 AM    Dictation Date and Time:09/12/2011 09:44:45  Transcription Date and Time:09/12/2011 12:18:09  eScription Dictation id: [id number] Confirmation # :[id number]      cc: Kimberly Nimrod MD    DICTATED BY: Kimberly Lindsey MDDANIELA Ruhama Lindsey MDD:09/12/2011 09:44:45 T:09/12/2011 12:18:09 AN Job#: [id number]

## 2011-09-26 ENCOUNTER — Ambulatory Visit (HOSPITAL_BASED_OUTPATIENT_CLINIC_OR_DEPARTMENT_OTHER): Payer: Medicaid Other | Admitting: Podiatrist

## 2011-09-26 VITALS — BP 137/78 | HR 78

## 2011-09-26 DIAGNOSIS — M722 Plantar fascial fibromatosis: Secondary | ICD-10-CM

## 2011-09-26 NOTE — Progress Notes (Signed)
Pain right foot for many years but has recently gotten worse over the last five months. No history of injury. Post static dyskinesia.     Right plantar fasciitis.    This office note has been dictated. Account number [id number]

## 2011-09-26 NOTE — Nursing Note (Signed)
>>   Davidson,Lynn RN     Mon Sep 26, 2011  1:54 PM  Started many years ago.  Few months has been intolerable

## 2011-09-28 NOTE — Progress Notes (Signed)
Date of Service: 09/26/2011    INITIAL EXAMINATION    CHIEF COMPLAINT:  Right heel pain.    HISTORY OF PRESENT ILLNESS:  This 31 year old female presents with identified right foot heel pain for several years, but it has recently gotten much worse over the last few months.  She cannot identify any history of precipitating trauma.  She states that the condition developed insidiously.  She comments on post-static dyskinesia.  She has had no recent formal podiatric medical care.    MEDICAL HISTORY:  1.  Anxiety state.  2.  Asthma.  3.  Positive PPD.  4.  Obesity.    ALLERGIES:  No known drug allergies.    CURRENT MEDICATIONS:  1.  Acetaminophen.  2.  Metoprolol.    PAST SURGICAL HISTORY:  C-section delivery.    SOCIAL HISTORY:  Married and unemployed.  She does not comment on tobacco or alcohol use.    FAMILY HISTORY:  Father diabetes.  Mother diabetes, hypertension.    REVIEW OF SYSTEMS:  A 10-system review was entertained and negative other than presenting complaint.    PHYSICAL EXAMINATION:  GENERAL:  This is an alert, oriented x67, 31 year old Portuguese-speaking female who is seen today with telephone interpreter.  She presents in no acute distress.  She is of good affect.  Body morphology suggests moderate obesity.    DISTAL EXTREMITY EVALUATION:  VASCULAR:  The patient does have well-palpated distal pulses bilateral.  Skin temperature is within normal.  There is no evidence of peripheral pitting edema.  NEUROLOGIC:  Deep tendon reflexes patellar and ankle are equal and symmetrical.  She has intact epicritic sensation to all dermatomes of both feet.  I cannot produce any type of Tinel with percussion of the right tarsal canal.  MUSCULOSKELETAL:  There is no gross skeletal derangement to either foot or ankle.  She has focal tenderness around the proximal attachment of the right plantar fascia into the calcaneal tubercle.  There is no localized erythema or edema.  Manual muscle testing confirms 5/5 strength for all  distal lower extremity groups.  DERMATOLOGIC:  There are no open wounds or suspicious lesions.    ASSESSMENT:  Right plantar fasciitis.    PLAN:  I did have a lengthy discussion with the patient regarding her clinical findings.  We did discuss the condition known as plantar fasciitis.  We discussed home exercise program, incorporation of heel cushion, and heel pad.  Given the chronicity and severity of the pain, I did suggest an isolated corticosteroid injection.  The inherent risks and benefits of this injection were outlined.  All questions were answered.  No guarantees were given.  The patient is understanding and would like to proceed.  After appropriate prepping of the skin overlying the right plantar fascia, this was injected with equal parts of 1% Xylocaine plain, dexamethasone phosphate 4 mg per mL, and Kenalog 10 mg per mL totalling 3 mL.  The injection was well tolerated.  She will proceed with home exercises.  I will see her back in 4 weeks.    ___________________________  Reviewed and Electronically Signed By: Rochele Pages DPM  Sig Date: 09/29/2011  Sig Time: 06:59:50  Dictated By: Rochele Pages DPM  Dict Date: 09/28/2011 Dict Time: 02 27 PM    Dictation Date and Time:09/28/2011 14:27:56  Transcription Date and Time:09/28/2011 14:56:03  eScription Dictation id: [id number] Confirmation # :[id number]      cc: Sharyl Nimrod MD    DICTATED BY: Rochele Pages DPMMICHAEL  Rexene Edison Aubrey Voong DPMD:09/28/2011 14:27:56 T:09/28/2011 14:56:03 2N Job#: [id number]

## 2011-10-20 ENCOUNTER — Ambulatory Visit (HOSPITAL_BASED_OUTPATIENT_CLINIC_OR_DEPARTMENT_OTHER): Payer: Medicaid Other | Admitting: Family Medicine

## 2011-10-20 ENCOUNTER — Encounter (HOSPITAL_BASED_OUTPATIENT_CLINIC_OR_DEPARTMENT_OTHER): Payer: Self-pay | Admitting: Family Medicine

## 2011-10-20 VITALS — BP 130/88 | HR 85 | Temp 98.1°F | Ht 60.63 in | Wt 197.8 lb

## 2011-10-20 DIAGNOSIS — Z789 Other specified health status: Secondary | ICD-10-CM

## 2011-10-20 DIAGNOSIS — M722 Plantar fascial fibromatosis: Secondary | ICD-10-CM

## 2011-10-20 DIAGNOSIS — R0683 Snoring: Secondary | ICD-10-CM

## 2011-10-20 DIAGNOSIS — R002 Palpitations: Secondary | ICD-10-CM

## 2011-10-20 DIAGNOSIS — E669 Obesity, unspecified: Secondary | ICD-10-CM

## 2011-10-20 NOTE — Progress Notes (Signed)
SUBJECTIVE:  Kimberly Lindsey is a 31 year old female who presents as new pt. Wants a new doctor for language reasons, and also for another opinion.    Can't work because of pain in foot/leg. 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  Saw podiatry 09/26/11 and got steroid injection but didn't help at all. Taking tylenol a lot but worries about liver.  Using ice, heat.   Due to see podiatry 8/26 again. Foot pain worse in the last 6 mos.    Also worried about wt- getting in the way of her daily life.  Tried many diets already/ saw nutrition. Tried treatment for anxiety. Lots of fluctuation in wt. Goes between 190-200 and can't get any lower. Can't exercise b/c of foot pain.   Took a bunch of pills for wt loss with endocrinologist in [place]. But had some SEs. And when she stopped then she gained wt again. Has thought about wt loss surgery but wants to be quite sure and informed before doing it.    Struggling with this for yrs. And feeling like she's not sure what to do next. Always has been overweight since childhood- stopped school at 7th grade because of teasing about wt.  All of her friends were thin.  Too nervous to return to school b/c traumatized by that experience.  Body mass index is 37.83 kg/(m^2).    Can't sleep lying down because she wakes up gasping for air.  +snoring. +gasping. Never did sleep study.  +sleepy during the day.     PHQ-9 TOTAL SCORE 10/20/2011 08/13/2010 12/24/2009   Questionnaire Total Score - 3 0   Doc FlowSheet Total Score 9 - -     Saw cardiologist and having palpitations- was told if she loses wt problem will go away.    PROBLEMS:   Patient Active Problem List:     Adjustment Disorder with Mixed Anxiety and Depressed Mood     Obesity      PAST MEDICAL, SURGICAL, FAMILY, SOCIAL, IMMUNIZATION, HEALTH MAINT HISTORY: The following sections have been reviewed and updated in Epic:  past medical history, past surgical history, family history, social history    MEDICATIONS:    Metoprolol xl 25mg  1/2 tab po daily     ALLERGIES:  Review of Patient's Allergies indicates:  No Known Allergies    OBJECTIVE:    VITALS:  BP 130/88   Pulse 85   Temp(Src) 98.1 F (36.7 C) (Oral)   Ht 5' 0.63" (1.54 m)   Wt 197 lb 12.8 oz (89.721 kg)   BMI 37.83 kg/m2   SpO2 99%   LMP 09/29/2011  General: Well appearing, no apparent distress. Alert and oriented x 3.  Eyes:  No scleral icterus.  Mouth: MMM, normal oropharynx  Neck: supple, thyroid normal, no significant cervical lymphadenopathy  Heart: regular rate and rhythm, normal S1 and S2, no murmurs, rubs or gallops.  Lungs:  Clear to auscultation bilaterally, no wheezing or rales  Skin: acanthosis nigr  Neuro: Cranial nerves intact, grossly normal strength and sensation. Normal gait  Extremities: warm, well perfused, no edema or cyanosis.   Psych: tearful at times. Normal affect, normal insight and judgment.     ASSESSMENT/PLAN:   PROBLEMS:   Kimberly Lindsey is a 31 year old female here for  (278.00) Obesity  (primary encounter diagnosis)  Obesity- BMI: 37. Extremely frustrated/tearful.   Discussed meds not a good long term option. Pt has exhausted many options already. Discussed option of wt loss surgery. Could  be a good candidate, especially if dx of OSA.   Difficult to exercise now due to foot pain but discussed that swimming may be a good option.   Will readdress referral to wt loss ctr at next visit.     (785.1) Palpitations  Comment: updated med list  Plan: metoprolol (TOPROL-XL) 25 MG 24 hr tablet    (786.09) Snoring  Comment: w/ sx of OSA and daytime somnolence. Refer for sleep study.  Plan: REFERRAL TO SLEEP STUDY (CARDIO-PULM)          Plantar fasciitis- not responsive to usual measures. Seeing podiatry again 8/26, will give podiatrist a heads up.    Return 1-2 mos    1. The patient indicates understanding of these issues and agrees with the plan.   No apparent learning barriers were identified. I attempted to answer any questions regarding the  diagnosis and the proposed treatment.  2.  The patient is given an After Visit Summary sheet that lists all of their medications with directions, their allergies, orders placed during this encounter, and follow- up instructions.  3. I reviewed the patient's medical information, allergies and medications. I reviewed the potential side effects of any new medications prescribed.     Electronically signed by: Hyacinth Meeker Petra Kuba, MD, 10/20/2011 11:49 AM  This note is electronically signed in the electronic medical record.

## 2011-10-24 ENCOUNTER — Ambulatory Visit (HOSPITAL_BASED_OUTPATIENT_CLINIC_OR_DEPARTMENT_OTHER): Payer: Medicaid Other | Admitting: Podiatrist

## 2011-10-24 DIAGNOSIS — M722 Plantar fascial fibromatosis: Secondary | ICD-10-CM

## 2011-10-24 DIAGNOSIS — M79671 Pain in right foot: Secondary | ICD-10-CM

## 2011-10-24 LAB — XR FOOT RIGHT MINIMUM 3 VIEWS

## 2011-10-24 NOTE — Progress Notes (Signed)
No improvement with injection. Persistent right heel pain.  Refer PT. Order x-ray    This office note has been dictated. Account number [id number]

## 2011-10-31 NOTE — Progress Notes (Signed)
Date of Service: 10/24/2011    PROGRESS UPDATE    SUBJECTIVE:  The patient is seen for followup regarding right plantar fasciitis.  On her last visit, she was given a trigger point corticosteroid injection.  She relates no notable improvement and continues to have significant pain with weightbearing activity and continues to endorse post-static dyskinesia.    REVIEW OF SYSTEMS:  The patient has no acute systemic complaints.  She denies any constitutional symptoms.    PHYSICAL EXAMINATION:  GENERAL:  This is an alert, oriented x33, 31 year old Portuguese-speaking female who does utilize [place] effectively.  She presents in no apparent distress.  She is morbidly overweight.    DISTAL EXTREMITY EVALUATION:  The patient continues to exhibit isolated pain around the proximal attachment of the right plantar fascia into the calcaneal tubercle.  There is no focal erythema or edema.  I cannot produce any type of Tinel with percussion of the tarsal canal.  There are no open wounds or lesions.    TESTS:  Weightbearing radiographs of the right foot have been obtained.  Lateral view is particularly evaluated.  There is normal morphology to the calcaneus.  I do not appreciate any type of inferior spur formation.    ASSESSMENT:  Persistence of right plantar fasciitis.    PLAN:  At this point, patient is going to be referred for formal physical therapy to include modalities as well as stretching and strengthening.  I have also educated her on appropriate as well as heel cushion, heel pad.  She will return subsequent to the therapy.    ___________________________  Reviewed and Electronically Signed By: Rochele Pages DPM  Sig Date: 11/01/2011  Sig Time: 09:33:02  Dictated By: Rochele Pages DPM  Dict Date: 10/31/2011 Dict Time: 12 08 PM    Dictation Date and Time:10/31/2011 12:08:06  Transcription Date and Time:10/31/2011 12:37:07  eScription Dictation id: [id number] Confirmation # :[id number]      cc: Margaree Mackintosh  MD    DICTATED BY: Rochele Pages DPMMICHAEL H Kato Wieczorek DPMD:10/31/2011 12:08:06 T:10/31/2011 12:37:07 2N Job#: [id number]

## 2011-11-10 ENCOUNTER — Ambulatory Visit (HOSPITAL_BASED_OUTPATIENT_CLINIC_OR_DEPARTMENT_OTHER): Payer: Medicaid Other | Admitting: Rehabilitative and Restorative Service Providers"

## 2011-11-10 ENCOUNTER — Encounter (HOSPITAL_BASED_OUTPATIENT_CLINIC_OR_DEPARTMENT_OTHER): Payer: Medicaid Other | Admitting: Rehabilitative and Restorative Service Providers"

## 2011-11-10 DIAGNOSIS — M722 Plantar fascial fibromatosis: Secondary | ICD-10-CM

## 2011-11-10 NOTE — Patient Instructions (Addendum)
Rehabilitation Treatment Flowsheet    Precautions:    Date: 11/10/11         Initials: TD         Visit #: 1         POC Due Date:          Time:          HEP          Treatment(s)            PT Eval     done

## 2011-11-10 NOTE — Progress Notes (Signed)
I certify that the documented Treatment Plan is reasonable and necessary.    11/10/2011  Kimberlea Schlag H. Joselyne Spake,DPM

## 2011-11-10 NOTE — Progress Notes (Signed)
Marland KitchenOUTPATIENT EVALUATION    REFERRING PROVIDER: Evelina Bucy  [address]  [facility]  [place], [place] [address]   Hx OF PRESENT ILLNESS: Pt reports onset of R foot pain @ 6 months ago.  She attempted walking program for weight loss, but was unable to tolerate activity.  She was seen by DrTtheodoulou with rial of injection without relief and now referred to PT.   PRECAUTIONS: HTN  Pt reports she does feel safe at home.   MEDICATIONS (Rx Comments, concerns): For a list of current medications review the Medication activity. reviewed   LEARNS BEST: Demonstration   MENTAL STATUS/COMMUNICATION: WNL     PRIMARY LANGUAGE: [place]     REQUIRES INTERPRETER: Yes   INTERPRETER PRESENT DURING EVAL: Yes   DRESSING/GROOMING: IMPAIRED: pain with standing and walking     DRIVING: WNL     SLEEPING: WNL     POSTURE/ALIGNMENT: Pt stands with wt shift to L.  Exhibits pes planus with forefoot abduction     OTHER:      NEUROLOGICAL: WNL     PALPATION: TTP plantar surface of R foot and heel  Subtalar varus noted with 2/6 hypomobility for lateral glides     GAIT: ambulates with decreased stance on R  Shortened step length and early heel rise.   She states she is only able to walk using croc shoes and requested patient to bring in sneakers or walking shoe next visit.     SKIN INTEGRITY: DRY & INTACT       Please Note: Only populated fields were assessed by provider, fields left blank were not assessed.    GIRTH LEFT RIGHT GIRTH LEFT RIGHT GIRTH LEFT RIGHT                           OTHER:    USE IMAGES ACTIVITY. IMAGE AVAILABLE: No  Ankle strength tested in supine due to pain in standing  Hip and Knee ROM is WNL    LEFT RIGHT  LEFT RIGHT   SHOULD A/PROM MMT A/PROM MMT HIP A/PROM MMT A/PROM MMT   FLEX.     FLEX.  5  4   EXT.     EXT.  4  4-   IR     IR       ER     ER       ABD     ABD       H. ABD     ADD       H. ADD     KNEE       ELBOW     FLEX  4  4   FLEX.     EXT  5  5   EXT.     ANKLE       WRIST     DF 8 5 2 5     FLEX.     PF  5 full 4   EXT.     IV  4+  4+   SUP/  PRON     EV  4+  4      SPECIAL TEST LEFT RIGHT SPECIAL TEST LEFT RIGHT SPECIAL TEST LEFT RIGHT   PSLR 65 55                                 Physical Therapy Plan of Care    UE:[id number]  B. Petra Kuba, MD  Referring Provider: Dr [place] Graff  Diagnosis: Plantar fascial fibromatosis  (primary encounter diagnosis)    Assessment/Objective Findings: Patient is a 31 year old female who reports onset of R heel and foot pain starting @ 6 months ago without relief from trial of injection.  She exhibits weakness of R foot and also of hip with altered gait pattern as noted.  She has decrease in DF and with sub talar varus noted and hypomobility for lateral glide.  She had decreased tolerance to SLS.  Advise PT as outlined with focus on stretching and strengthening program.      Pain, Decreased ROM, Decreased Strength, Decreased Functional Mobility, Decreased Joint Mobility and Decreased Tolerance of ADLs  Patient will benefit from skilled PT to address the rehabilitation goals outlined below.    Rehabilitation Goals    Average pain will be reduced from a 9/10 to a 3/10. Duration: 4 weeks  Patient to be Independent with Home Exercise Program.  Duration: 4 weeks  Patient will have at least 5 degrees of DF.  Duration 4 weeks  Pt will demonstrate increase in Right hip and ankle strength by 1/2 grade  Duration 4 weeks  Patient will tolerate SLS x > 10 sec bilaterally.  Duration 4 weeks  Patient will demonstrate normal subtalar alignment and mobility.  Duration 4 weeks    Long Term Goal: DC with return to PLOF  Pt able to resume walking program.    Treatment Plan: ** Stretching/ROM Exercise  ** Therapeutic Exercise  ** Home Exercise Program  ** Joint Mobilization  ** Soft Tissue Mobilization  ** Hot/Cold Rx  ** Gait Training  ** Functional Activities  ** Patient Education  ** Taping    Recommend Physical Therapy be continued 2 times per week for 4 weeks.  The rehabilitation  potential for this patient is good    Patient is agreeable to treatment plan and is aware of attendance policy.    Sandi Raveling

## 2011-11-14 ENCOUNTER — Ambulatory Visit (HOSPITAL_BASED_OUTPATIENT_CLINIC_OR_DEPARTMENT_OTHER): Payer: Medicaid Other | Admitting: Rehabilitative and Restorative Service Providers"

## 2011-11-15 ENCOUNTER — Ambulatory Visit (HOSPITAL_BASED_OUTPATIENT_CLINIC_OR_DEPARTMENT_OTHER): Payer: Medicaid Other | Admitting: Rehabilitative and Restorative Service Providers"

## 2011-11-15 DIAGNOSIS — M722 Plantar fascial fibromatosis: Secondary | ICD-10-CM

## 2011-11-15 NOTE — Progress Notes (Signed)
S:  Patient reports she has increased pain in am and cannot walk.  States her pain is 8/10 with Tylenol.    O: Patient Ed:  Instructed in indications and contra-indications of Kinesio taping.  Applied long I strip with attached fan strip for plantar fascitis,  I strip to anchor ends of tails and I strip medial heel/ankle to lateral.  Refer to Rehabilitation Flow Sheet for details.  A:  Palpation reveals tenderness mid line plantar fascia R foot.  Initiated STM and JMOB.  Gait:  Ambulates with moderate limp on right.    Tolerated treatment well.  P:  Continue with skilled PT services.

## 2011-11-15 NOTE — Patient Instructions (Addendum)
Rehabilitation Treatment Flowsheet    Precautions:    Date: 11/10/11 11/14/11        Initials: TD JR        Visit #: 1 2        POC Due Date:          Time:  35        HEP          Treatment(s)            PT Eval     done           STM R plantar fascia    x        Talocrual joint mob with increasing DF R ankle  x        Subtalar joint mob - lateral glides R  x        Kinesio taping - plantar fascitis, I strip to anchor ends of tails & I strip medial heel to lateral heel  X

## 2011-11-17 ENCOUNTER — Ambulatory Visit (HOSPITAL_BASED_OUTPATIENT_CLINIC_OR_DEPARTMENT_OTHER): Payer: Medicaid Other | Admitting: Rehabilitative and Restorative Service Providers"

## 2011-11-17 DIAGNOSIS — M722 Plantar fascial fibromatosis: Secondary | ICD-10-CM

## 2011-11-17 NOTE — Patient Instructions (Addendum)
Rehabilitation Treatment Flowsheet    Precautions:    Date: 11/10/11 11/14/11 11/17/11       Initials: TD JR TD       Visit #: 1 2 3        POC Due Date:          Time:  35        HEP          Treatment(s)            PT Eval     done           STM R plantar fascia    x        Talocrual joint mob with increasing DF R ankle  x done       Subtalar joint mob - lateral glides R  x done       Kinesio taping - plantar fascitis, I strip to anchor ends of tails & I strip medial heel to lateral heel  X   Fan for plantar fascitis and I strip for subtalar varus         Calf stretch       gastroc and soleus stretches x 2 with 30 sec hold         Ankle strengthening       PF on edge of step and SLS with arm chop

## 2011-11-17 NOTE — Progress Notes (Signed)
.  S: Pt reports that her pain level is 7/10.  She states that she had some relief with taping last visit.    O: Refer to Rehabilitation Treatment Flowsheet    A: Tolerated treatment well with some pain with SLS and calf stretching.  Repeated trialof Kinesio tape for plantar fasctits and for sub talar varus.    P: Continue as per Plan of Care

## 2011-11-18 NOTE — Procedures (Signed)
Date of Service: 11/02/2011    INDICATION:  Daytime somnolence.    Complete study dated may be viewed in the media section of the EPIC medical record.    The Epworth sleepiness scale is suggestive of sleep apnea, scoring 17 out of a possible 24 total points.  The patient responds positively to approximately 1/4 of questions on the sleep symptomatology quiz including having trouble sleeping when she has a cold, being obese, and being tired during the day.    The BMI of 40 places the patient in the category of class 2 obesity.  The patient takes no medications that should affect sleep architecture.  The neck circumference of 15-1/2 inches is not considered a risk factor for sleep apnea in a female.    Overnight polysomnography was recorded for a total time of 381 minutes.  Of that time, 245 were spent in sleep, resulting in a sleep efficiency that is moderately reduced at only 64%.  Latency to sleep onset was slightly increased at 22 minutes.  REM onset was delayed at 197 minutes.  There was approximately 3 times the expected stage 1 sleep, the anticipated amount of stage 2, 3, and 4 sleep, and a decrease in the stage REM sleep.  Overnight there were 14 _____.  There were no apneas or hypopneas, giving an apnea-hypopnea index of 0, which is normal.  Oximetry at baseline was 97%.  The nadir was 93%.  Heart rate average was 74 beats per minute, ranging from 56 to 110 beats per minute.  Snoring index was normal at 14 minutes.  There were no periodic leg movements in sleep, resulting in a normal PLMS index.    SUMMARY:  Study negative for sleep apnea.    ___________________________  Reviewed and Electronically Signed By: Drema Halon MD  Sig Date: 11/19/2011  Sig Time: 14:12:38  Dictated By: Drema Halon MD  Dict Date: 11/18/2011 Dict Time: 05 28 PM    Dictation Date and Time:11/18/2011 17:28:45  Transcription Date and Time:11/18/2011 17:54:23  eScription Dictation id: [id number] Confirmation # :[id number]      DICTATED  BY: Drema Halon MDDonald Logan Bores MDD:11/18/2011 17:28:45 T:11/18/2011 17:54:23 KN Job#: [id number]

## 2011-11-21 ENCOUNTER — Ambulatory Visit (HOSPITAL_BASED_OUTPATIENT_CLINIC_OR_DEPARTMENT_OTHER): Payer: Medicaid Other | Admitting: Podiatrist

## 2011-11-22 ENCOUNTER — Ambulatory Visit (HOSPITAL_BASED_OUTPATIENT_CLINIC_OR_DEPARTMENT_OTHER): Payer: Medicaid Other | Admitting: Rehabilitative and Restorative Service Providers"

## 2011-11-24 ENCOUNTER — Ambulatory Visit (HOSPITAL_BASED_OUTPATIENT_CLINIC_OR_DEPARTMENT_OTHER): Payer: Medicaid Other | Admitting: Rehabilitative and Restorative Service Providers"

## 2011-11-24 DIAGNOSIS — M722 Plantar fascial fibromatosis: Secondary | ICD-10-CM

## 2011-11-24 NOTE — Progress Notes (Signed)
S: Pt reports that she is feeling better.  Still has pain at 6/10 with initial walking and with prolonged walking.  States she has to wear sneakers with arch support for comfort.    O: Refer to Rehabilitation Treatment Flowsheet    A: Pt reports feeling better overall with primary c/o R heel pain.  She does have some restriction of achilles and localized tenderness R heel.  She had good tolerance to eccentric exercise. She is well motivated and compliant with HEP.    P: Continue as per Plan of Care

## 2011-11-24 NOTE — Patient Instructions (Addendum)
Rehabilitation Treatment Flowsheet    Precautions:    Date: 11/10/11 11/14/11 11/17/11 11/24/11      Initials: TD JR TD TD      Visit #: 1 2 3 4       POC Due Date:          Time:  68        HEP          Treatment(s)            PT Eval     done           STM R plantar fascia    x  Done with achilles mobs also      Talocrual joint mob with increasing DF R ankle  x done       Subtalar joint mob - lateral glides R  x done       Kinesio taping - plantar fascitis, I strip to anchor ends of tails & I strip medial heel to lateral heel  X   Fan for plantar fascitis and I strip for subtalar varus         Calf stretch       gastroc and soleus stretches x 2 with 30 sec hold Reviewed gastroc and soeus stretch        Ankle strengthening       PF on edge of step and SLS with arm chop Eccentric ankle ex with manual resistance and on edge of step

## 2011-11-28 ENCOUNTER — Ambulatory Visit (HOSPITAL_BASED_OUTPATIENT_CLINIC_OR_DEPARTMENT_OTHER): Payer: Self-pay | Admitting: Rehabilitative and Restorative Service Providers"

## 2011-12-01 ENCOUNTER — Ambulatory Visit (HOSPITAL_BASED_OUTPATIENT_CLINIC_OR_DEPARTMENT_OTHER): Payer: Self-pay | Admitting: Rehabilitative and Restorative Service Providers"

## 2011-12-05 ENCOUNTER — Ambulatory Visit (HOSPITAL_BASED_OUTPATIENT_CLINIC_OR_DEPARTMENT_OTHER): Payer: Self-pay | Admitting: Rehabilitative and Restorative Service Providers"

## 2011-12-06 ENCOUNTER — Ambulatory Visit (HOSPITAL_BASED_OUTPATIENT_CLINIC_OR_DEPARTMENT_OTHER): Payer: Medicaid Other | Admitting: Family Medicine

## 2011-12-06 ENCOUNTER — Encounter (HOSPITAL_BASED_OUTPATIENT_CLINIC_OR_DEPARTMENT_OTHER): Payer: Self-pay | Admitting: Family Medicine

## 2011-12-06 VITALS — BP 120/80 | HR 105 | Temp 98.1°F | Ht 60.5 in | Wt 199.4 lb

## 2011-12-06 DIAGNOSIS — E669 Obesity, unspecified: Secondary | ICD-10-CM

## 2011-12-06 NOTE — Progress Notes (Signed)
SUBJECTIVE:  Kimberly Lindsey is a 31 year old female who presents for f/u sleep study. was ordered b/c of BMI 38, daytime somnolence, snoring, waking up suddenly with SOB.  Just had sleep study:    Overnight polysomnography was recorded for a total time of 381 minutes. Of that time, 245 were spent in sleep, resulting in a sleep efficiency that is moderately reduced at only 64%. Latency to sleep onset was slightly increased at 22 minutes. REM onset was delayed at 197 minutes. There was approximately 3 times the expected stage 1 sleep, the anticipated amount of stage 2, 3, and 4 sleep, and a decrease in the stage REM sleep. Overnight there were 14 _____. There were no apneas or hypopneas, giving an apnea-hypopnea index of 0, which is normal. Oximetry at baseline was 97%. The nadir was 93%. Heart rate average was 74 beats per minute, ranging from 56 to 110 beats per minute. Snoring index was normal at 14 minutes. There were no periodic leg movements in sleep, resulting in a normal PLMS index.   SUMMARY: Study negative for sleep apnea.    PROBLEMS:   Patient Active Problem List:     Adjustment Disorder with Mixed Anxiety and Depressed Mood     Obesity      PAST MEDICAL, SURGICAL, FAMILY, SOCIAL, IMMUNIZATION, HEALTH MAINT HISTORY: The following sections have been reviewed and updated in Epic:  past medical history, past surgical history, family history, social history    MEDICATIONS:   Current Outpatient Prescriptions on File Prior to Visit:  metoprolol (TOPROL-XL) 25 MG 24 hr tablet Take 0.5 tablets by mouth daily. Disp: 60 tablet Rfl: 12   acetaminophen (TYLENOL) 325 MG tablet Take 650 mg by mouth every 6 (six) hours as needed. Disp:  Rfl:      No current facility-administered medications on file prior to visit.     ALLERGIES:  Review of Patient's Allergies indicates:  No Known Allergies    OBJECTIVE:    VITALS:  BP 120/80   Pulse 105   Temp(Src) 98.1 F (36.7 C) (Temporal)   Ht 5' 0.5" (1.537 m)   Wt 199  lb 6.4 oz (90.447 kg)   BMI 38.29 kg/m2   SpO2 100%  General: Well appearing, no apparent distress. Alert and oriented x 3.  Psych: Normal affect, normal insight and judgment.     ASSESSMENT/PLAN:   PROBLEMS:   Kimberly Lindsey is a 31 year old female here for  (278.00) Obesity  (primary encounter diagnosis)  Body mass index is 38.29 kg/(m^2).   She is decided that she wants to pursue wt loss surgery if eligible. Will have referral coordinator look into where to send her.    Discussed results. Neg study for OSA but still abnormal sleep architecture. Could benefit from sleep specialist or trial of meds.    Visit was cut short today because pt couldn't stay.    Declines flu shot. tdap next time.    I have spent 15 minutes in face to face time with this patient/patient proxy of which > 50% was in counseling or coordination of care regarding above issues/Dx.      1. The patient indicates understanding of these issues and agrees with the plan.   No apparent learning barriers were identified. I attempted to answer any questions regarding the diagnosis and the proposed treatment.  2.  The patient is given an After Visit Summary sheet that lists all of their medications with directions, their allergies, orders placed during  this encounter, and follow- up instructions.  3. I reviewed the patient's medical information, allergies and medications. I reviewed the potential side effects of any new medications prescribed.     Electronically signed by: Hyacinth Meeker Petra Kuba, MD, 12/06/2011 3:58 PM  This note is electronically signed in the electronic medical record.

## 2011-12-08 ENCOUNTER — Ambulatory Visit (HOSPITAL_BASED_OUTPATIENT_CLINIC_OR_DEPARTMENT_OTHER): Payer: Medicaid Other | Admitting: Rehabilitative and Restorative Service Providers"

## 2012-02-10 ENCOUNTER — Ambulatory Visit (HOSPITAL_BASED_OUTPATIENT_CLINIC_OR_DEPARTMENT_OTHER): Payer: Self-pay | Admitting: Family Medicine

## 2012-03-12 ENCOUNTER — Ambulatory Visit (HOSPITAL_BASED_OUTPATIENT_CLINIC_OR_DEPARTMENT_OTHER): Payer: Medicaid Other | Admitting: Internal Medicine

## 2013-04-07 ENCOUNTER — Emergency Department (HOSPITAL_BASED_OUTPATIENT_CLINIC_OR_DEPARTMENT_OTHER)
Admission: RE | Admit: 2013-04-07 | Disposition: A | Discharge status: Home / Self Care | Payer: Self-pay | Source: Emergency Department | Attending: Emergency Medicine | Admitting: Emergency Medicine

## 2013-04-07 ENCOUNTER — Encounter (HOSPITAL_BASED_OUTPATIENT_CLINIC_OR_DEPARTMENT_OTHER): Payer: Self-pay

## 2013-04-07 MED ORDER — DIPHENHYDRAMINE HCL 50 MG/ML IJ SOLN
25.00 mg | Freq: Once | INTRAMUSCULAR | Status: AC
Start: 2013-04-07 — End: 2013-04-07
  Administered 2013-04-07: 25 mg via INTRAVENOUS
  Filled 2013-04-07: qty 1

## 2013-04-07 MED ORDER — KETOROLAC TROMETHAMINE 30 MG/ML IJ SOLN
15.00 mg | Freq: Once | INTRAMUSCULAR | Status: AC
Start: 2013-04-07 — End: 2013-04-07
  Administered 2013-04-07: 15 mg via INTRAVENOUS
  Filled 2013-04-07: qty 1

## 2013-04-07 MED ORDER — METOCLOPRAMIDE HCL 5 MG PO TABS
5.00 mg | ORAL_TABLET | Freq: Four times a day (QID) | ORAL | Status: AC
Start: 2013-04-07 — End: 2013-04-11

## 2013-04-07 MED ORDER — SODIUM CHLORIDE 0.9 % IV BOLUS
1000.0000 mL | Freq: Once | INTRAVENOUS | Status: AC
  Administered 2013-04-07: 1000 mL via INTRAVENOUS

## 2013-04-07 MED ORDER — METOCLOPRAMIDE HCL 5 MG/ML IJ SOLN
10.00 mg | Freq: Once | INTRAMUSCULAR | Status: AC
Start: 2013-04-07 — End: 2013-04-07
  Administered 2013-04-07: 10 mg via INTRAVENOUS
  Filled 2013-04-07: qty 2

## 2013-04-07 NOTE — Discharge Instructions (Signed)
You were seen in the ER for a headache, which is more severe than your normal headaches.  You have never had migraine before, as far as you know, however the headache symptoms you report are most consistent with migraine.  Your neurologic exam was normal and reassuring.  You were treated in the ER with medications usually used for migraine, and felt significant relief.  You are safe to be discharged.    Please follow-up with your primary care doctor.    Return to the ER for any new or worsening symptoms that concern you, especially sudden onset severe headache, headache with fainting, or difficulty speaking or moving.

## 2013-04-07 NOTE — ED Provider Notes (Signed)
[facility] Resident Note    HPI: Kimberly Lindsey is a 33 year old female who presents w/ headache.  She describes a relatively rapid onset occipital, constant headache approximately 24 hours ago, denies syncope or neurologic dysfunction, and endorses some mild nausea, moderate photophobia, and some bilateral scotomas, as well as occasional mild lightheadedness.  She has a history of mild headaches in the past, and takes no medications.  She recorded a blood pressure of 150s/90s today at a pharmacy, but is normotensive here.    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    Adjustment disorder with mixed anxiety and depressed mood 05/26/2006    Obesity 04/22/2009       Medications:  Current facility-administered medications:metoclopramide (REGLAN) injection 10 mg, 10 mg, Intravenous, Once, Crawford GivensBrian Zennie Ayars;  ketorolac (TORADOL) injection 15 mg, 15 mg, Intravenous, Once, Crawford GivensBrian Lucciano Vitali;  diphenhydrAMINE (BENADRYL) injection 25 mg, 25 mg, Intravenous, Once, Crawford GivensBrian Jahbari Repinski;  sodium chloride 0.9 % IV bolus 1,000 mL, 1,000 mL, Intravenous, Once, Crawford GivensBrian Latonya Nelon  Current outpatient prescriptions:metoprolol (TOPROL-XL) 25 MG 24 hr tablet, Take 0.5 tablets by mouth daily., Disp: 60 tablet, Rfl: 12;  acetaminophen (TYLENOL) 325 MG tablet, Take 650 mg by mouth every 6 (six) hours as needed., Disp: , Rfl:     Allergies:  Review of Patient's Allergies indicates:  No Known Allergies    Social history:    Social History   Marital Status: Married  Spouse Name: N/A    Years of Education: N/A  Number of Children: N/A     Occupational History  None on file     Social History Main Topics   Smoking status: Never Smoker     Smokeless tobacco: Never Used    Alcohol Use: No    Drug Use: No    Sexual Activity: Yes    Partners: Male    Comment: H/O abnormal pap after her last C/S, S/P cauterization, normal since then.  No H/O STDs.     Other Topics Concern   None on file      Social History Narrative    Lives with the father of her third son - and son        2 other sons are in [place] - ages 4112, 7410 - live with her mom    Ex husband left her - is not supporting 2 sons - told her she was on her own.        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        Lives in [place] with husband and son.     2 kids in [place].       Review of Systems - pertinent positives and negatives as per HPI. All other systems reviewed and negative.     Physical Exam:  VS:    04/07/13  1612   BP: 128/65   Pulse: 78   Temp: 98.2 F   Resp: 18   Weight: 86.183 kg (190 lb)   SpO2: 100%     Constitutional: Well-appearing, alert and interactive. No distress.   Head: Normocephalic and atraumatic.   HEENT: PERRL, EOMI. Moist mucous membranes. No oropharyngeal erythema or swelling.  Limited fundoscopy however disc margins appear sharp for short periods in which they were visualized.  Cardiovascular: Normal rate, regular rhythm, normal heart  sounds and intact/equal distal pulses.    Pulmonary/Chest: Effort normal and breath sounds normal. no wheezes. no rales.   Abdominal: Soft. NTND. Bowel sounds present/normal. No distension or guarding.   Genitourinary:   Musculoskeletal: Extremities WWP, full ROM. No deformity.   Neurological: A/O x3, CN II-XII intact. Strength 5/5 in upper and lower extremities. Sensation grossly intact to light touch. Gait normal, no ataxia. No nuchal rigidity or meningismus.  Skin: Skin is warm and dry.   Psychiatric: normal mood and affect. behavior is normal. Judgment and thought content normal.     Labs: not indicated    Imaging: not indicated    Procedures: none    Medical Decision Making: The patient presents with relatively rapid onset severe headache.  Although SAH cannot be excluded, she is very well-appearing and neurologically intact 24 hours after the onset of her headache, and the majority of her associated symptoms are migrainous.  She was  initially treated with migraine therapy including diphenhydramine, ketorolac, and reglan, after which her symptoms were nearly completely resolved.  She was PO tolerant, and comfortable with discharge.  She was provided with instructions for close PCP follow-up and strict return precautions.    This patient was staffed with ED attending Dr. Corine Shelter, MD    MD, 04/07/2013, 4:31 PM

## 2013-04-07 NOTE — ED Notes (Signed)
Headache improved some dizzyness

## 2013-04-07 NOTE — ED Notes (Signed)
Patient Disposition    Patient education for diagnosis, medications, activity, diet and follow-up.  Patient left ED 6:29 PM.  Patient rep received written instructions.  Interpreter to provide instructions: No    Discharged to: Discharged to home

## 2013-04-07 NOTE — ED Triage Note (Signed)
Pt complains of headache since yesterday afternoon. [facility] 153/90 hr 88lt arm . Pt reports some nausea , photophobia and floaties .

## 2013-04-12 NOTE — ED Provider Notes (Signed)
I performed a history and physical examination of the patient and discussed management with the PA/resident. I reviewed the PA/resident's note and agree with the documented finding and plan of care.    ED Triage Vitals   Enc Vitals Group      BP 04/07/13 1612 128/65 mmHg      Pulse 04/07/13 1612 78      Resp 04/07/13 1612 18      Temp 04/07/13 1612 98.2 F      Temp src --       SpO2 04/07/13 1612 100 %      Weight 04/07/13 1612 86.183 kg (190 lb)      Height --       Head Cir --       Peak Flow --       Pain Score --       Pain Loc --       Pain Edu? --       Excl. in GC? --       04/07/13  1612 04/07/13  1828   BP: 128/65    Pulse: 78 72   Temp: 98.2 F    Resp: 18 16   Weight: 86.183 kg (190 lb)    SpO2: 100% 100%       HPI:  33 year old female, with complaint of Headache  .  Patient complains of headache the last 24 hours.  She denies any neck pain or neck stiffness.  She complains of light and noise bothers her head pain.  She states she has a history of headaches.  She does not take any medication.  She also complained that her blood pressure seemed to be a little high today.    PE:  Well appearing/well hydrated, nad, lungs CTA, heart RRR, abd soft/nt.    Course & MDM:    Patient is a 33 year old female presented to the emergency department with headache.  Patient was treated as a migraine headache.  After therapy she felt much better.  Patient be discharged home.  Patient also wants to be discharged home.    Discharge Medication List as of 04/07/2013  6:19 PM    START taking these medications    metoclopramide (REGLAN) 5 MG tablet  Take 1 tablet by mouth 4 (four) times daily.Tamperproof, Oral, 4 TIMES DAILY, Disp-16 tablet, R-0          Diagnosis:  Headache  Migraine    [place]Jordan Amariz Flamenco, DO  Attending Physician, [place]  Clinical Instructor, [employer]

## 2013-04-17 ENCOUNTER — Encounter (HOSPITAL_BASED_OUTPATIENT_CLINIC_OR_DEPARTMENT_OTHER): Payer: Self-pay | Admitting: Family Medicine

## 2013-04-17 ENCOUNTER — Ambulatory Visit (HOSPITAL_BASED_OUTPATIENT_CLINIC_OR_DEPARTMENT_OTHER): Payer: Medicaid Other | Admitting: Family Medicine

## 2013-04-17 VITALS — BP 110/80 | HR 115 | Temp 99.1°F | Ht 60.5 in | Wt 197.0 lb

## 2013-04-17 DIAGNOSIS — E669 Obesity, unspecified: Secondary | ICD-10-CM

## 2013-04-17 DIAGNOSIS — F329 Major depressive disorder, single episode, unspecified: Principal | ICD-10-CM

## 2013-04-17 DIAGNOSIS — R519 Headache, unspecified: Secondary | ICD-10-CM

## 2013-04-17 DIAGNOSIS — F32A Depression, unspecified: Secondary | ICD-10-CM | POA: Insufficient documentation

## 2013-04-17 DIAGNOSIS — M722 Plantar fascial fibromatosis: Secondary | ICD-10-CM

## 2013-04-17 DIAGNOSIS — R51 Headache: Secondary | ICD-10-CM

## 2013-04-17 LAB — THYROID SCREEN TSH REFLEX FT4: THYROID SCREEN TSH REFLEX FT4: 1.6 u[IU]/mL (ref 0.358–3.740)

## 2013-04-17 LAB — LOW DENSITY LIPOPROTEIN DIRECT: LOW DENSITY LIPOPROTEIN DIRECT: 98 mg/dL (ref 0–189)

## 2013-04-17 LAB — HIGH DENSITY LIPOPROTEIN: HIGH DENSITY LIPOPROTEIN: 52 mg/dL (ref 40–?)

## 2013-04-17 LAB — CHOLESTEROL: Cholesterol: 177 mg/dL (ref 0–239)

## 2013-04-17 MED ORDER — FLUOXETINE HCL 20 MG PO TABS
20.0000 mg | ORAL_TABLET | Freq: Every day | ORAL | Status: DC
Start: 2013-04-17 — End: 2013-05-27

## 2013-04-17 NOTE — Progress Notes (Signed)
SUBJECTIVE:  Kimberly Lindsey is a 33 year old female who presents for f/u on multiple concerns. Last here with me in 2013.    -Struggling with wt loss. Tried herbalife but too expensive.  Did lose 15 lbs but couldn't continue program.    -Frequent HAs. Recent ER visit for "migraine" HA is frontal or occipital. No N/V.+photophobia/phonophobia. Pressure type HA. 1-2x/wk.   Takes ibuprofen 800mg  1-2x a day for "body pain" especially in feet.  Wonders if this is too often.    -10 d before menses gets lots of anxiety/depression.  1 yr like this, definitely feels cyclic but also some baseline depression.  Migraines more common pre-menses too.  Labile moods.  Sleep is poor at night, but during the day too tired.   Feels out of shape, hard to go up stairs, gets winded.  decr libido. Doesn't want to have sex with husband. Denies DV.    -Hx of depression in [place].  Took prozac in [place] (as well as here). Helped a lot   Did therapy a few times in 2011. Didn't help.  No hx of SI/HI.      -Bilat heel pain, hx of plantar fasciitis. Had injection 2013 but didn't help and didn't go back to podiatry. Using otc shoe inserts.    -Hx of palpitations but these are resolved.    PROBLEMS:   Patient Active Problem List:     Adjustment Disorder with Mixed Anxiety and Depressed Mood     Obesity     Acute depression      PAST MEDICAL, SURGICAL, FAMILY, SOCIAL, IMMUNIZATION, HEALTH MAINT HISTORY: The following sections have been reviewed and updated in Epic:  past medical history, past surgical history, family history, social history    MEDICATIONS:   Ibuprofen 800mg  prn.      ALLERGIES:  Review of Patient's Allergies indicates:  No Known Allergies    OBJECTIVE:    VITALS:  BP 110/80   Pulse 115   Temp(Src) 99.1 F (37.3 C) (Temporal)   Ht 5' 0.5" (1.537 m)   Wt 197 lb (89.359 kg)   BMI 37.83 kg/m2   SpO2 100%   LMP 03/19/2013  General: Well appearing, no apparent distress. Alert and oriented x 3.  Eyes:  No scleral  icterus.  Heart: regular rate and rhythm, normal S1 and S2, no murmurs, rubs or gallops.  Lungs:  Clear to auscultation bilaterally, no wheezing or rales  Extremities: warm, well perfused,bilat pain at distal calcaneus on plantar surface of feet. Normal appearing arches.  Psych: Normal affect,tearful at times, normal insight and judgment.     ASSESSMENT/PLAN:   PROBLEMS:   Kimberly Lindsey is a 33 year old female here for  (296.20) Acute depression  Depression:  PHQ-9:  423  -patient at low risk for suicide based on our discussion today  -restart prozac, reinforced compliance  -Denies DV  -declines counseling.  -call with any side effects, suicidal thoughts  Care plan done. F/u here 4 wks on this and nurse will call her to f/u.    HAs- by hx migraine vs tension. Readdress further next time.    (278.00) Obesity  Body mass index is 37.83 kg/(m^2).  Did not address at length. Check labs, treat depression first.    Bilat plantar fasciitis- refer to podiatry again. Already failed injection on one foot.    1. The patient indicates understanding of these issues and agrees with the plan.   No apparent learning barriers were identified. I  attempted to answer any questions regarding the diagnosis and the proposed treatment.  2.  The patient is given an After Visit Summary sheet that lists all of their medications with directions, their allergies, orders placed during this encounter, and follow- up instructions.  3. I reviewed the patient's medical information, allergies and medications. I reviewed the potential side effects of any new medications prescribed.     Electronically signed by: Hyacinth Meeker Petra Kuba, MD, 04/17/2013 10:46 AM  This note is electronically signed in the electronic medical record.

## 2013-04-18 ENCOUNTER — Telehealth (HOSPITAL_BASED_OUTPATIENT_CLINIC_OR_DEPARTMENT_OTHER): Payer: Self-pay | Admitting: Registered Nurse

## 2013-04-18 LAB — HEMOGLOBIN A1C
ESTIMATED AVERAGE GLUCOSE: 111 (ref 74–160)
HEMOGLOBIN A1C: 5.5 % (ref 4.0–5.6)

## 2013-04-18 NOTE — Progress Notes (Signed)
Irving BurtonEmily at [facility] transferred active refills of Fluoxetine 20 mg from [facility] per patient request.

## 2013-04-18 NOTE — Progress Notes (Signed)
Patient would like Rx for prozac changed from  [facility] [id number] [place]  [address] [facility]  [phone]  to  [facility] ([place])  Phone: [phone] Fax: [phone]

## 2013-04-18 NOTE — Telephone Encounter (Signed)
Message copied by Kipp LaurenceICOTSKY, Julyana Woolverton A. on Thu Apr 18, 2013  2:49 PM  ------       Message from: Pecolia AdesALDANA, KARLA       Created: Thu Apr 18, 2013  2:28 PM       Regarding: medication       Contact: [phone]          [name] [phone], 33 year old, female              CALL BACK NUMBER: [phone]       Best time to call back: anytime       Cell phone:        Other phone:              Available times:              Patient's language of care: Tong[place]Portuguese              Patient needs a [place]aPortuguese interpreter.              Patient's PCP: Hyacinth Meekerachel B. Petra KubaVogel, MD              Person calling on behalf of patient: Patient (self)              Calls today:  Medication Questions  Patient Calling         What medication do you have questions about FLUoxetine (PROZAC) 20 MG tablet       What is your question: would [facility]        Return phone number                               ------

## 2013-05-02 ENCOUNTER — Ambulatory Visit (HOSPITAL_BASED_OUTPATIENT_CLINIC_OR_DEPARTMENT_OTHER): Payer: Medicaid Other | Admitting: Podiatrist

## 2013-05-13 ENCOUNTER — Encounter (HOSPITAL_BASED_OUTPATIENT_CLINIC_OR_DEPARTMENT_OTHER): Payer: Self-pay | Admitting: Family Medicine

## 2013-05-14 ENCOUNTER — Telehealth (HOSPITAL_BASED_OUTPATIENT_CLINIC_OR_DEPARTMENT_OTHER): Payer: Self-pay

## 2013-05-14 ENCOUNTER — Ambulatory Visit (HOSPITAL_BASED_OUTPATIENT_CLINIC_OR_DEPARTMENT_OTHER): Payer: Medicaid Other | Admitting: Family Medicine

## 2013-05-14 NOTE — Progress Notes (Addendum)
Depression outreach call  Doing very well  Her husband remarked abou t how calm she is now  She fell on ice this AM and couldn't come in- just wanted to go home.  Said she tried t call  She is feeling ok now  Was unable to go to the podiatry referral apt, as she had no money for a cab  Made apt for the end of the month  See questionaire

## 2013-05-14 NOTE — Telephone Encounter (Signed)
Message copied by Gaynelle ArabianAGUE, Exie Chrismer on Tue May 14, 2013  1:26 PM  ------       Message from: Margaree MackintoshVOGEL, RACHEL       Created: Tue May 14, 2013  1:00 PM         Gotcha.  Well, she no showed.... Do you mind calling her??       Thx! R       ----- Message -----          From: Gaynelle ArabianMonica Ginette Bradway          Sent: 05/14/2013  12:27 PM            To: Margaree Mackintoshachel Vogel              Yes, won't be on the list without the acute       ----- Message -----          From: Margaree Mackintoshachel Vogel          Sent: 05/13/2013   7:57 PM            [facility]              FYI-- this was an acute depression pt from last month who you missed on your list.... Likely because I didn't put the class as Acute on the problem list??  (now it is in there).        Anyway, she is seeing me tomorrow 3/17 so hopefully doing better.               thx R                ------

## 2013-05-27 ENCOUNTER — Ambulatory Visit (HOSPITAL_BASED_OUTPATIENT_CLINIC_OR_DEPARTMENT_OTHER): Payer: Medicaid Other | Admitting: Family Medicine

## 2013-05-27 ENCOUNTER — Encounter (HOSPITAL_BASED_OUTPATIENT_CLINIC_OR_DEPARTMENT_OTHER): Payer: Self-pay | Admitting: Family Medicine

## 2013-05-27 VITALS — BP 140/76 | HR 114 | Temp 98.6°F | Ht 60.5 in | Wt 196.0 lb

## 2013-05-27 DIAGNOSIS — E669 Obesity, unspecified: Secondary | ICD-10-CM

## 2013-05-27 DIAGNOSIS — F32A Depression, unspecified: Secondary | ICD-10-CM

## 2013-05-27 DIAGNOSIS — Z23 Encounter for immunization: Secondary | ICD-10-CM

## 2013-05-27 DIAGNOSIS — F329 Major depressive disorder, single episode, unspecified: Principal | ICD-10-CM

## 2013-05-27 MED ORDER — FLUOXETINE HCL 40 MG PO CAPS
40.0000 mg | ORAL_CAPSULE | Freq: Every day | ORAL | Status: DC
Start: 2013-05-27 — End: 2013-11-19

## 2013-05-27 NOTE — Patient Instructions (Signed)
[email or web address]  [email or web address]  [email or web address]

## 2013-05-27 NOTE — Progress Notes (Signed)
VIS given prior to administration and reviewed with the patient and or legal guardian. Patient understands the disease and the vaccine. See immunization/Injection module or chart review for date of publication and additional information. VIS provided in language of care, Tong[place].    Per orders of Provider. Vogel, injection of TDAP given by [facility],Othman Masur RN.

## 2013-05-27 NOTE — Progress Notes (Signed)
SUBJECTIVE:  Kimberly Lindsey is a 33 year old female who presents for f/u depression- feels much better, less anxious/more animated.  Not sleeping very well.  Husband snores, she has light sleep. Waking up all night q 1 hr.  Still has issues with eating.  Some anxious eating. Wants some guidance.  Had [place] nutritionist but private/expensive. Didn't lose wt well.  Not very active except with work.    Had to cancel podiatry. Plans to rebook.    Missed appt 3/17 because of fall on ice.    PROBLEMS:   Patient Active Problem List:     Adjustment Disorder with Mixed Anxiety and Depressed Mood     Obesity     Acute depression      PAST MEDICAL, SURGICAL, FAMILY, SOCIAL, IMMUNIZATION, HEALTH MAINT HISTORY: The following sections have been reviewed and updated in Epic:  past medical history, past surgical history, family history, social history    MEDICATIONS:   Current Outpatient Prescriptions on File Prior to Visit:  FLUoxetine (PROZAC) 20 MG tablet Take 1 tablet by mouth daily. Disp: 30 tablet Rfl: 2     No current facility-administered medications on file prior to visit.     ALLERGIES:  Review of Patient's Allergies indicates:  No Known Allergies    OBJECTIVE:    VITALS:  BP 140/76   Pulse 114   Temp(Src) 98.6 F (37 C) (Temporal)   Ht 5' 0.5" (1.537 m)   Wt 196 lb (88.905 kg)   BMI 37.63 kg/m2   SpO2 97%  General: Well appearing, no apparent distress. Alert and oriented x 3.  Psych: Normal affect, normal insight and judgment.     ASSESSMENT/PLAN:   PROBLEMS:   Kimberly Lindsey is a 33 year old female here for  (296.20) Acute depression  (primary encounter diagnosis)  Reports improvement. incr to 40mg /day, declines counseling.   Plan: FLUoxetine (PROZAC) 40 MG capsule      (278.00) Obesity  Body mass index is 37.63 kg/(m^2). wants guidance from nutritionist- gave her some online apps in english (she will look for portuguese ones)  Plan: REFERRAL TO NUTRITION ( INT) NON-DIABETES    tdap given    Return 2  mos with PA- place on recall list.    1. The patient indicates understanding of these issues and agrees with the plan.   No apparent learning barriers were identified. I attempted to answer any questions regarding the diagnosis and the proposed treatment.  2.  The patient is given an After Visit Summary sheet that lists all of their medications with directions, their allergies, orders placed during this encounter, and follow- up instructions.  3. I reviewed the patient's medical information, allergies and medications. I reviewed the potential side effects of any new medications prescribed.     Electronically signed by: Hyacinth Meekerachel B. Petra KubaVogel, MD, 05/27/2013 7:17 PM  This note is electronically signed in the electronic medical record.

## 2013-06-06 ENCOUNTER — Telehealth (HOSPITAL_BASED_OUTPATIENT_CLINIC_OR_DEPARTMENT_OTHER): Payer: Self-pay

## 2013-06-06 NOTE — Progress Notes (Addendum)
otreach depression call  Will check on medication increase and repeat PHQ9  Left voicemail to call us back

## 2013-06-12 ENCOUNTER — Telehealth (HOSPITAL_BASED_OUTPATIENT_CLINIC_OR_DEPARTMENT_OTHER): Payer: Self-pay

## 2013-06-12 ENCOUNTER — Encounter (HOSPITAL_BASED_OUTPATIENT_CLINIC_OR_DEPARTMENT_OTHER): Payer: Self-pay | Admitting: Family Medicine

## 2013-06-12 NOTE — Progress Notes (Addendum)
Initial depression call  Was supposed to start prozac 3/30  Left voicemail to call us back

## 2013-06-13 ENCOUNTER — Telehealth (HOSPITAL_BASED_OUTPATIENT_CLINIC_OR_DEPARTMENT_OTHER): Payer: Self-pay

## 2013-06-13 NOTE — Progress Notes (Addendum)
Spoke with pt  She has not started the 40 mg fluoxitine yet  Will pick it up tomorrow  Apt made for May 1st with Antony [facility] for F/U  Agreed to call if symptoms from new dose

## 2013-06-28 ENCOUNTER — Ambulatory Visit (HOSPITAL_BASED_OUTPATIENT_CLINIC_OR_DEPARTMENT_OTHER): Payer: Medicaid Other

## 2013-07-12 ENCOUNTER — Telehealth (HOSPITAL_BASED_OUTPATIENT_CLINIC_OR_DEPARTMENT_OTHER): Payer: Self-pay

## 2013-07-12 ENCOUNTER — Encounter (HOSPITAL_BASED_OUTPATIENT_CLINIC_OR_DEPARTMENT_OTHER): Payer: Self-pay

## 2013-07-12 NOTE — Progress Notes (Addendum)
Depression outreach  Unable to reach pt as phone just rings

## 2013-07-12 NOTE — Telephone Encounter (Signed)
Message copied by Gaynelle ArabianAGUE, Taryll Reichenberger on Fri Jul 12, 2013  4:18 PM  ------       Message from: Irena ReichmannMARTIN, CAITLIN       Created: Fri Jul 12, 2013  3:14 PM       Regarding: FW: return call       Contact: [phone]                       ----- Message -----          From: [place] OuchElisangela Barbosa          Sent: 07/12/2013   3:11 PM            To: [facility]       Subject: return call                                                 Blenda Nicelyatricia Mulrooney [phone], 33 year old, female              CALL BACK NUMBER: [phone]       Best time to call back:        Cell phone:        Other phone:              Available times:              Patient's language of care: Tong[place]              Patient does need a [place]  interpreter.              Patient's PCP: Hyacinth Meekerachel B. Petra KubaVogel, MD              Person calling on behalf of patient: Patient (self)              Calls today:  Returning Daphene Chisholm's phone call         ------

## 2013-07-12 NOTE — Progress Notes (Addendum)
Depression call  Returned call to designated number and it just rings

## 2013-07-17 ENCOUNTER — Encounter (HOSPITAL_BASED_OUTPATIENT_CLINIC_OR_DEPARTMENT_OTHER): Payer: Self-pay | Admitting: Family Medicine

## 2013-07-25 ENCOUNTER — Ambulatory Visit (HOSPITAL_BASED_OUTPATIENT_CLINIC_OR_DEPARTMENT_OTHER): Payer: Medicaid Other | Admitting: Registered"

## 2013-09-25 ENCOUNTER — Encounter (HOSPITAL_BASED_OUTPATIENT_CLINIC_OR_DEPARTMENT_OTHER): Payer: Self-pay

## 2013-11-19 ENCOUNTER — Encounter (HOSPITAL_BASED_OUTPATIENT_CLINIC_OR_DEPARTMENT_OTHER): Payer: Self-pay | Admitting: Family Medicine

## 2013-11-19 ENCOUNTER — Ambulatory Visit (HOSPITAL_BASED_OUTPATIENT_CLINIC_OR_DEPARTMENT_OTHER): Payer: Medicaid Other | Admitting: Family Medicine

## 2013-11-19 VITALS — BP 130/80 | HR 112 | Temp 97.4°F | Wt 197.4 lb

## 2013-11-19 DIAGNOSIS — F4323 Adjustment disorder with mixed anxiety and depressed mood: Secondary | ICD-10-CM

## 2013-11-19 DIAGNOSIS — R400 Somnolence: Secondary | ICD-10-CM

## 2013-11-19 DIAGNOSIS — K219 Gastro-esophageal reflux disease without esophagitis: Secondary | ICD-10-CM

## 2013-11-19 DIAGNOSIS — E669 Obesity, unspecified: Secondary | ICD-10-CM

## 2013-11-19 MED ORDER — OMEPRAZOLE MAGNESIUM 20 MG PO TBEC
20.00 mg | DELAYED_RELEASE_TABLET | Freq: Every day | ORAL | Status: AC
Start: 2013-11-19 — End: 2014-04-21

## 2013-11-19 MED ORDER — FLUOXETINE HCL 20 MG PO CAPS
20.0000 mg | ORAL_CAPSULE | Freq: Every day | ORAL | Status: DC
Start: 2013-11-19 — End: 2014-10-15

## 2013-11-19 NOTE — Progress Notes (Signed)
SUBJECTIVE:  Kimberly Lindsey is a 33 year old female who presents for f/u depression.  Depression much better.  Had HAs on  of prozac, better on  daily.  Stopped prozac completely this spring but anxiety returned and PMS sx returned.     When having PMS sx very hungry and overeats sometimes.     Youngest son age 7 demands a lot of her but pt feels hard to play with him b/c of leg and back pain.  Can't walk a lot because of back pain and foot pain. Has seen podiatry in the past for plantar fasciitis.    Wasn't able to go to nutrition appt. Wants to do wt mgmt at [facility]. Already saw someone there but needs a referral.  Body mass index is 37.9 kg/(m^2).    Teenage kids came to live with her which she is very happy about.     Burning pain in throat at night. Uses multiple pillows to help with this. Feels suffocated by this.  Thinks she has apnea.   Snores loudly.    1 mo ago posterior neck pain and HA. Went to [facility] and BP elevated (not sure how high). Went to [facility].    PROBLEMS:   Patient Active Problem List:     Adjustment Disorder with Mixed Anxiety and Depressed Mood     Obesity     Acute depression      PAST MEDICAL, SURGICAL, FAMILY, SOCIAL, IMMUNIZATION, HEALTH MAINT HISTORY: The following sections have been reviewed and updated in Epic:  past medical history, past surgical history, family history, social history    MEDICATIONS:   No current outpatient prescriptions on file prior to visit.  No current facility-administered medications on file prior to visit.     ALLERGIES:  Review of Patient's Allergies indicates:  No Known Allergies    OBJECTIVE:    VITALS:  BP 130/80 mmHg   Pulse 112   Temp(Src) 97.4 F (36.3 C) (Oral)   Wt 89.54 kg (197 lb 6.4 oz)   SpO2 100%   LMP 10/12/2013 (Exact Date)  General: Well appearing, no apparent distress. Alert and oriented x 3.  Eyes: PERRLA, EOMI. No scleral icterus.  Mouth: MMM, normal oropharynx  Neck: supple, thyroid normal, no significant cervical  lymphadenopathy  Heart: regular rate and rhythm, normal S1 and S2, no murmurs, rubs or gallops.  Lungs:  Clear to auscultation bilaterally, no wheezing or rales  Skin: No rash  Neuro: Cranial nerves intact, grossly normal strength and sensation. Normal gait  Extremities: warm, well perfused, no edema   Psych: Normal affect, normal insight and judgment.     ASSESSMENT/PLAN:   PROBLEMS:   Kimberly Lindsey is a 33 year old female here for  (278.01) Obesity (BMI 35.0-39.9 without comorbidity)  (primary encounter diagnosis)  Pt wants referral to [facility].    Plan: REFERRAL TO NUTRITION (EXT)          (309.28) Adjustment disorder with mixed anxiety and depressed mood  Comment: doing much better on .  phq-9 is now very low- to be entered in epic. Change to continuation.  Plan: FLUoxetine (PROZAC) 20 MG capsule    (530.81) Gastroesophageal reflux disease without esophagitis  Comment: possible reason for "suffocating" feeling at night. Pt had negative sleep study 2 yrs ago for OSA. Consider sleep specialist referral  Plan: omeprazole (PRILOSEC OTC) 20 MG tablet    (780.54) Daytime somnolence  Likely multifactorial.    Return 1 mo to f/u back pain  1. The patient indicates understanding of these issues and agrees with the plan.   No apparent learning barriers were identified. I attempted to answer any questions regarding the diagnosis and the proposed treatment.  2.  The patient is given an After Visit Summary sheet that lists all of their medications with directions, their allergies, orders placed during this encounter, and follow- up instructions.  3. I reviewed the patient's medical information, allergies and medications. I reviewed the potential side effects of any new medications prescribed.     Electronically signed by: Hyacinth Meeker Petra Kuba, MD, 11/19/2013 10:59 AM  This note is electronically signed in the electronic medical record.

## 2013-12-17 ENCOUNTER — Ambulatory Visit (HOSPITAL_BASED_OUTPATIENT_CLINIC_OR_DEPARTMENT_OTHER): Payer: Medicaid Other | Admitting: Family Medicine

## 2014-01-10 ENCOUNTER — Encounter (HOSPITAL_BASED_OUTPATIENT_CLINIC_OR_DEPARTMENT_OTHER): Payer: Self-pay

## 2014-03-10 ENCOUNTER — Telehealth (HOSPITAL_BASED_OUTPATIENT_CLINIC_OR_DEPARTMENT_OTHER): Payer: Self-pay

## 2014-03-10 NOTE — Progress Notes (Signed)
Ellin GoodieNatalia Bracamonte-Moreno, 03/10/2014, 12:52 PM  Writer called pt to outreach for depression and offer pt a new appt with Dr. Petra KubaVogel since pt missed last appt. Pt refused due to going on a trip for a month to her aunt's house. Pt said she will call back to make appt when she returns from trip. Pt also said she is feeling much better and is still taking medication FLUoxetine (PROZAC) 20 MG capsule.

## 2014-05-08 ENCOUNTER — Emergency Department (HOSPITAL_BASED_OUTPATIENT_CLINIC_OR_DEPARTMENT_OTHER)
Admission: RE | Admit: 2014-05-08 | Disposition: A | Discharge status: Home / Self Care | Payer: Self-pay | Source: Emergency Department | Attending: Emergency Medicine | Admitting: Emergency Medicine

## 2014-05-08 LAB — INFLUENZA A/B, AG, EIA
INFLUENZA B: NEGATIVE
Influenza A: NEGATIVE

## 2014-05-08 MED ORDER — ACETAMINOPHEN 500 MG PO TABS
1000.00 mg | ORAL_TABLET | Freq: Once | ORAL | Status: AC
Start: 2014-05-08 — End: 2014-05-08
  Administered 2014-05-08: 1000 mg via ORAL
  Filled 2014-05-08: qty 2

## 2014-05-08 NOTE — ED Triage Note (Signed)
Presents ambulatory to the ED with c/o bady aches, fever and cough starting yesterday. Patient reports taking ibuprofen and tylenol with no relief in body aches. Last motrin this afternoon. Febrile to 101.3 in triage.

## 2014-05-09 LAB — XR CHEST 2 VIEWS

## 2014-05-09 MED ORDER — ACETAMINOPHEN 500 MG PO CAPS
500.0000 mg | ORAL_CAPSULE | ORAL | Status: AC | PRN
Start: 2014-05-09 — End: 2014-05-12

## 2014-05-09 MED ORDER — IBUPROFEN 600 MG PO TABS
600.00 mg | ORAL_TABLET | Freq: Once | ORAL | Status: AC
Start: 2014-05-09 — End: 2014-05-09
  Administered 2014-05-09: 600 mg via ORAL
  Filled 2014-05-09: qty 1

## 2014-05-09 MED ORDER — BENZONATATE 100 MG PO CAPS
100.0000 mg | ORAL_CAPSULE | Freq: Three times a day (TID) | ORAL | Status: AC | PRN
Start: 2014-05-09 — End: 2014-05-12

## 2014-05-09 MED ORDER — DEXTROSE-NACL 5-0.9 % IV BOLUS
1000.00 mL | Freq: Once | INTRAVENOUS | Status: AC
Start: 2014-05-09 — End: 2014-05-09
  Administered 2014-05-09: 1000 mL via INTRAVENOUS

## 2014-05-09 MED ORDER — METOCLOPRAMIDE HCL 5 MG/ML IJ SOLN
10.00 mg | Freq: Once | INTRAMUSCULAR | Status: AC
Start: 2014-05-09 — End: 2014-05-09
  Administered 2014-05-09: 10 mg via INTRAVENOUS
  Filled 2014-05-09: qty 2

## 2014-05-09 MED ORDER — IBUPROFEN 800 MG PO TABS
800.0000 mg | ORAL_TABLET | Freq: Three times a day (TID) | ORAL | Status: AC | PRN
Start: 2014-05-09 — End: 2014-05-14

## 2014-05-09 NOTE — Discharge Instructions (Signed)
Please followup with your primary care physician in 2-3 days for reexamination and reevaluation to assess for clinical improvement. Call tomorrow to check in, update your doctor and arrange follow up.    TEST RESULTS:  Your rapid flu was negative  Your chest x ray was negative.    FURTHER CARE:  Monitor your fever, stay hydrated. Use prescribed or over-the-counter medications for symptom relief making sure to monitor the amounts of tylenol your are consuming. Use Tessalon Perles for cough relief.    Use a decongestant like pseudoephedrine or phenylephrine for nasal congestion. Nasal sprays like Afrin/Dristran (Oxymetazoline) can be helpful but only use them for 3 days or less otherwise they will trigger symptoms when you stop them.    Fever Control:   Fever is as any temperature greater than or equal to 100.4 Fahrenheit.    Please use over the counter Tylenol (acetaminophen) and Ibuprofen (Advil) for fever control.  You may use Tylenol and ibuprofen concurrently for fever control.    Please make sure you monitor your Tylenol use. Tylenol is the same as acetaminophen. For adults, any amount over 3.5 grams (3,500 mg) in any 24 hour period can cause liver damage, possibly irreversible.   Please make sure to check all the medications you are taking including over-the counter (OTC) medications. Many multi-symptom medications have acetaminophen in them so please check carefully.    For an adult, do not take more than 3200 mg of Ibuprofen in any 24 hour period.   Please contact your doctor or pharmacist for any additional questions.    Hydration:  Please drink plenty of water as being sick can cause you to become very dehydrated quickly making you feel worse including severe headaches. We recommend that you drink plenty of water or juice. Pedia-lyte is best - even for adults. You should target to have your urine light yellow to clear. Use low calorie sports drinks like Gatorade cautiously as some have artificial  sweeteners that can cause diarrhea if you drink too much of them. Avoid drinks with caffeine like soda, coffee, and tea as caffeine will dehydrate you.     NEW MEDICATIONS:  Ibuprofen: This is an NSAID anti-inflammatory. Please use this as needed for pain or fever. Please take this with food to prevent stomach irritation. For an adult, do not take more than 3200mg  of this medication in any 24 hour period. Do not take any other NSAIDs (like Advil, Aleve, Naproxen) while taking this medication. Please contact your doctor or pharmacist for any additional questions.    Tylenol: Do not take more than 3 grams in one day. Please read all ingredients in other medications as some cough and cold medications have tylenol in them as well.     Tessalon Perles (benzonatate): This is a very good cough suppressant and you should take this as directed when you need relief from your cough.    - Alternate doses of Tylenol (acetaminophen) with Motrin/Advil (ibuprofen) to control fever/pain.  Wait 6 hours between the SAME kind of medication.       WHEN SHOULD YOU BE SEEN NEXT?  Please call your doctor and be seen with in the next 10 days for re-evaluation if your symptoms are not improving. If you do not have a primary care doctor or would like to transfer [facility] to [facility], please call [phone] to set one up an appointment.    WHEN SHOULD YOU RETURN TO THE ED?  Please return to the  emergency room if develop increased pain, different pain, pain that does not resolve, severe headache, stiff neck, neck pain, a fever that will not resolve with tylenol or ibuprofen, vomiting that prevents you from keeping down fluids, you have difficulty breathing, are unable to swallow, begin drooling, your symptoms worsen, you get new symptoms or you are unable to schedule follow up care.

## 2014-05-09 NOTE — Narrator Note (Signed)
Patient Disposition    Patient education for diagnosis, medications, activity, diet and follow-up.  Patient left ED 3:35 AM.  Patient rep received written instructions.  Interpreter to provide instructions: No    Patient belongings with patient: YES    Have all existing LDAs been addressed? Yes    Have all IV infusions been stopped? Yes    Discharged to: Discharged to home w/ instructions to f/u w/ PCP. Pt left w/o concerns or questions.

## 2014-05-09 NOTE — ED Provider Notes (Signed)
Attending addendum:    I independently performed a history and physical examination of the patient and discussed the management with the PA.    ED nursing record was reviewed. Prior records as available electronically through the Epic record were reviewed.    Patient's mode of arrival was by Self.  Arrival time: 05/08/2014 10:29 PM  Chief complaint: Fever and Cough    HPI:    This 34 year old female patient presents to the emergency department with body aches, cough, fever.  She reports the symptoms started yesterday.  Cough is nonproductive, no hemoptysis.  Pain in chest when coughing; No chest pain at rest, no shortness of breath or pleuritic pain. Denies nausea, vomiting, or diarrhea.  No abdominal pain. No severe headache. Denies any neck pain or stiff neck.  No rash.    ROS: Pertinent positives were reviewed as per the HPI above. All other systems were reviewed and are negative.    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  No current outpatient prescriptions on file prior to encounter.    Social History:   Social History   Marital Status: Married  Spouse Name: N/A    Years of Education: N/A  Number of Children: N/A     Occupational History  None on file     Social History Main Topics   Smoking status: Never Smoker     Smokeless tobacco: Never Used    Alcohol Use: No    Drug Use: No    Sexual Activity: Yes    Partners: Male    Comment: H/O  abnormal pap after her last C/S, S/P cauterization, normal since then.  No H/O STDs.     Other Topics Concern   None on file     Social History Narrative    Lives with the father of her third son - and son        2 other sons are in [place] - ages 10612, 2810 - live with her mom    Ex husband left her - is not supporting 2 sons - told her she was on her own.        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        Lives in [place] with husband and son.     2 kids in [place].       Allergies:  Review of Patient's Allergies indicates:  No Known Allergies      Physical Exam:  Patient Vitals for the past 24 hrs:   BP Temp Temp src Pulse Resp SpO2   05/09/14 0327 - 98.6 F Oral - - -   05/09/14 0126 132/52 mmHg  101 F - 102 - 100 %   05/08/14 2243 133/68 mmHg 101.3 F - 102 26 100 %       GENERAL:  No acute distress, non-toxic   SKIN:  No rash, no petechia.  HEAD:  NCAT. Oropharynx is clear with moist mucous membranes. PERRL. EOMI.  NECK:  No C-spine tenderness, crepitus.  No meningismus. No stridor. Full ROM without difficulty  LUNGS:  Clear to auscultation bilaterally. No wheezes, rales, rhonchi.   HEART:  RRR.   ABDOMEN:  Soft, NTND. No involuntary guarding or rebound.  No peritoneal signs.  EXTREMITIES:  No obvious deformities.  Warm and well perfused.   NEUROLOGIC:  Alert and oriented x4; moves all extremities well; speaking in clear fluent sentences. Normal gait without ataxia; nonfocal.   PSYCHIATRIC:  Appropriate for age, time of day, and situation    ED Course and Medical Decision-making:  34 year old female patient presents emergency department with cough, body aches, fever, overall symptoms are most consistent with upper respiratory infection, viral syndrome. No signs, symptoms or exam findings consistent with pneumonia.  No signs, symptoms or exam findings consistent with meningitis.    Chest pain with cough, CXR and ECG obtained. No chest pain at rest or shortness of  breath.    Reevaluation: EKG personally reviewed, chest x-ray personally reviewed.  She was given medications for symptomatic improvement, IV fluids and Reglan.  She feels significantly improved and is requesting discharge home.    Plan for conservative management therapy, close outpatient follow-up.    I discussed discharge instructions with the patient.  I gave the patient explicit return precautions which included any new or different symptoms, high fevers, severe headache, neck stiffness, chest pain, shortness of breath, difficulty breathing abdominal pain, or nausea or vomiting.  They express understanding of these return precautions.    The patient agrees with this plan and disposition.  Patient had an opportunity to ask questions which were answered.     Condition on Discharge: Improved and Stable    Diagnosis/Diagnoses:  Body aches  (primary encounter diagnosis)  Cough  Fever in adult    Temple Pacini MD    This Emergency Department patient encounter note was created using voice-recognition software and in real time during the ED visit. Please excuse any typographical errors that have not yet been reviewed and corrected.

## 2014-05-09 NOTE — Narrator Note (Addendum)
Pt OOB to BR.  Reports she feels "so much better"

## 2014-05-09 NOTE — Narrator Note (Signed)
Pt asleep in bed.  Will re-eval pt when fluids are complete.

## 2014-05-10 LAB — EKG

## 2014-05-16 ENCOUNTER — Encounter (HOSPITAL_BASED_OUTPATIENT_CLINIC_OR_DEPARTMENT_OTHER): Payer: Self-pay | Admitting: Licensed Practical Nurse

## 2014-10-15 ENCOUNTER — Ambulatory Visit (HOSPITAL_BASED_OUTPATIENT_CLINIC_OR_DEPARTMENT_OTHER): Payer: Medicaid Other | Admitting: Family Medicine

## 2014-10-15 ENCOUNTER — Encounter (HOSPITAL_BASED_OUTPATIENT_CLINIC_OR_DEPARTMENT_OTHER): Payer: Self-pay | Admitting: Family Medicine

## 2014-10-15 VITALS — BP 115/64 | HR 102 | Temp 97.8°F | Ht 61.0 in | Wt 192.0 lb

## 2014-10-15 DIAGNOSIS — Z0000 Encounter for general adult medical examination without abnormal findings: Secondary | ICD-10-CM

## 2014-10-15 DIAGNOSIS — F4323 Adjustment disorder with mixed anxiety and depressed mood: Secondary | ICD-10-CM

## 2014-10-15 DIAGNOSIS — F329 Major depressive disorder, single episode, unspecified: Secondary | ICD-10-CM

## 2014-10-15 DIAGNOSIS — E6609 Other obesity due to excess calories: Secondary | ICD-10-CM

## 2014-10-15 DIAGNOSIS — F332 Major depressive disorder, recurrent severe without psychotic features: Secondary | ICD-10-CM

## 2014-10-15 DIAGNOSIS — R0683 Snoring: Secondary | ICD-10-CM

## 2014-10-15 DIAGNOSIS — F32A Depression, unspecified: Secondary | ICD-10-CM

## 2014-10-15 MED ORDER — FLUOXETINE HCL 20 MG PO CAPS
20.0000 mg | ORAL_CAPSULE | Freq: Every day | ORAL | 5 refills | Status: DC
Start: 2014-10-15 — End: 2014-11-11

## 2014-10-15 NOTE — Progress Notes (Signed)
SUBJECTIVE:  Kimberly Lindsey is a 34 year old female who presents for check up.    Hasn't been able to lose wt. Very frustrated. Tried a lot of pills for this.    Hard to breathe. Snoring a lot, wakes up choking, witnessed apnea. Daytime sleepiness. Sleeping in different room than husband b/c of this.    Joint pain on fingers. R knee pain. And some swelling of feet in the AM especially when she steps down.    Very depressed, sleep is poor.     ROS: no SI, no abd pain, no N/V.    PROBLEMS:   Patient Active Problem List:     Adjustment disorder with mixed anxiety and depressed mood     Obesity     Acute depression      PAST MEDICAL, SURGICAL, FAMILY, SOCIAL, IMMUNIZATION, HEALTH MAINT HISTORY: The following sections have been reviewed and updated in Epic:  past medical history, past surgical history, family history, social history    MEDICATIONS:   No current outpatient prescriptions on file prior to visit.  No current facility-administered medications on file prior to visit.      ALLERGIES:  Review of Patient's Allergies indicates:  No Known Allergies    OBJECTIVE:    VITALS:  BP 115/64  Pulse 102  Temp 97.8 F (36.6 C) (Temporal)  Ht  (1.549 m)  Wt 87.1 kg (192 lb)  LMP 10/13/2014  SpO2 99%  BMI 36.28 kg/m2  General: Well appearing, no apparent distress. Alert and oriented x 3.  Eyes: PERRLA, EOMI. No scleral icterus.  Mouth: MMM, normal oropharynx  Neck: supple, thyroid normal, no significant cervical lymphadenopathy, enlarged neck circ  Heart: regular rate and rhythm, normal S1 and S2, no murmurs, rubs or gallops.  Lungs:  Clear to auscultation bilaterally, no wheezing or rales  Abdomen: soft, non-tender, non-distended, no masses palpated, no organomegaly, no rebound or guarding  Spine: no visible abnormalities  Skin: No rash  Psych: Normal affect, normal insight and judgment.     ASSESSMENT/PLAN:   PROBLEMS:   Kimberly Lindsey is a 34 year old female here for  (Z00.00) Routine general medical  examination at a health care facility  (primary encounter diagnosis)  Pt to return for pap, on menses.   S/p BTL.     (E66.09) Non morbid obesity due to excess calories  Comment: BMI 36, pt decided she wants wt loss surgery, has tried everything. Likely has OSA- order sleep study.  Plan: pt will contact [facility] for info session regarding this.    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  (F43.23) Adjustment disorder with mixed anxiety and depressed mood  Comment: phq-9 of 18, declines therapy, restart prozac, care plan updated.    (R06.83) Snoring  Comment: as above- order sleep study.    Return 1 mo for f/u depression and pap smear    1. The patient indicates understanding of these issues and agrees with the plan.   No apparent learning barriers were identified. I attempted to answer any questions regarding the diagnosis and the proposed treatment.  2.  The patient is given an After Visit Summary sheet that lists all of their medications with directions, their allergies, orders placed during this encounter, and follow- up instructions.  3. I reviewed the patient's medical information, allergies and medications. I reviewed the potential side effects of any new medications prescribed.     Electronically signed by: Hyacinth Meeker Petra Kuba, MD, 10/15/2014 3:26  PM  This note is electronically signed in the electronic medical record.

## 2014-11-11 ENCOUNTER — Ambulatory Visit (HOSPITAL_BASED_OUTPATIENT_CLINIC_OR_DEPARTMENT_OTHER): Payer: Medicaid Other | Admitting: Family Medicine

## 2014-11-11 ENCOUNTER — Telehealth (HOSPITAL_BASED_OUTPATIENT_CLINIC_OR_DEPARTMENT_OTHER): Payer: Self-pay | Admitting: Clinic/Center

## 2014-11-11 ENCOUNTER — Encounter (HOSPITAL_BASED_OUTPATIENT_CLINIC_OR_DEPARTMENT_OTHER): Payer: Self-pay | Admitting: Family Medicine

## 2014-11-11 VITALS — BP 114/69 | HR 83 | Temp 97.7°F | Wt 191.6 lb

## 2014-11-11 DIAGNOSIS — M25571 Pain in right ankle and joints of right foot: Secondary | ICD-10-CM

## 2014-11-11 DIAGNOSIS — F329 Major depressive disorder, single episode, unspecified: Principal | ICD-10-CM

## 2014-11-11 DIAGNOSIS — R5383 Other fatigue: Secondary | ICD-10-CM

## 2014-11-11 DIAGNOSIS — Z124 Encounter for screening for malignant neoplasm of cervix: Secondary | ICD-10-CM

## 2014-11-11 DIAGNOSIS — R5381 Other malaise: Secondary | ICD-10-CM

## 2014-11-11 DIAGNOSIS — E6609 Other obesity due to excess calories: Secondary | ICD-10-CM

## 2014-11-11 DIAGNOSIS — F32A Depression, unspecified: Secondary | ICD-10-CM

## 2014-11-11 MED ORDER — FLUOXETINE HCL 40 MG PO CAPS
40.0000 mg | ORAL_CAPSULE | Freq: Every day | ORAL | 5 refills | Status: DC
Start: 2014-11-11 — End: 2015-04-13

## 2014-11-11 NOTE — Progress Notes (Signed)
SUBJECTIVE:  Kimberly Lindsey is a 34 year old female who presents for f/u mood and pap smear.    Anxiety/ depression. A bit better on prozac more relaxed, but wants to incr dose.     bilat leg/knee pain.  Fatigue. Forgetful.  This AM choking in her sleep- felt like she couldn't breathe.  Has sleep study booked 9/29.    Diffuse finger pain, some AM stiffness but only lasting ~5 minutes, asking about uric acid testing. Mom with that as well as DM.  Once had R toe pain/redness.    Poor sex life because poor body image/self esteem.  Husband tells her that she needs to lose wt.  He doesn't treat her badly but pt doesn't feel like there is romance x 1 yr.  Awaiting sleep study and wt loss surgery.     Denies SI    PROBLEMS:   Patient Active Problem List:     Adjustment disorder with mixed anxiety and depressed mood     Obesity     Acute depression      PAST MEDICAL, SURGICAL, FAMILY, SOCIAL, IMMUNIZATION, HEALTH MAINT HISTORY: The following sections have been reviewed and updated in Epic:  past medical history, past surgical history, family history, social history    MEDICATIONS:   No current outpatient prescriptions on file prior to visit.  No current facility-administered medications on file prior to visit.      ALLERGIES:  Review of Patient's Allergies indicates:  No Known Allergies    OBJECTIVE:    VITALS:  BP 114/69  Pulse 83  Temp 97.7 F (36.5 C) (Temporal)  Wt 86.9 kg (191 lb 9.6 oz)  LMP 10/30/2014  SpO2 100%  BMI 36.2 kg/m2  General: Well appearing, no apparent distress. Alert and oriented x 3.  Eyes: PERRLA, EOMI. No scleral icterus.  Mouth: MMM, normal oropharynx  Neck: supple, thyroid normal, no significant cervical lymphadenopathy  Heart: regular rate and rhythm, normal S1 and S2, no murmurs, rubs or gallops.  Lungs:  Clear to auscultation bilaterally, no wheezing or rales  Abdomen: soft, non-tender, non-distended, no masses palpated, no organomegaly, no rebound or guarding  GU: normal  external genitalia, normal appearance of cervix without lesions or polyps. No cervical motion tenderness. Uterus anteverted, anteflexed. Normal size without tenderness or masses.  Adnexa palpate normally bilaterally  extr- bilat digits of hands normal in appearance, full ROM  Psych: Normal affect, normal insight and judgment.     ASSESSMENT/PLAN:   PROBLEMS:   Kimberly Lindsey is a 34 year old female here for  (F32.9) Acute depression  (primary encounter diagnosis)  Comment: phq-9 still 17. incr prozac to /day, declines therapy. Self esteem/ sex life are a struggle for her. Feels safe with partner.    (M25.571) Arthralgia of right foot  Comment: ?sx of gout/podagra in the past. Now some mild finger stiffness in AM- not long lasting or suggestive of RA. Check uric acid per pt preference.  Plan: URIC ACID, CANCELED: URIC ACID        (E66.09) Non morbid obesity due to excess calories  Comment: BMI 39. Likely candidate for wt loss surgery. Getting sleep study to confirm OSA- symptoms very suggestive-- which will likely help her qualify. Can try to book appt with wt loss center at [facility] for October.  Plan: COLLECTION VENOUS BLOOD VENIPUNCTURE, LIPID         PANEL, COLLECTION VENOUS BLOOD VENIPUNCTURE,         HEMOGLOBIN A1C,    (  Z12.4) Screening for malignant neoplasm of cervix  Comment: pap/hpv  Plan: OBTAINING SCREEN PAP SMEAR, CYTP CERV/VAG AUTO         THIN LAYER PREP MNL SCREEN, HUMAN         PAPILLOMAVIRUS    (R53.81,  R53.83) Malaise and fatigue  Comment: likely related to untreated depression and OSA-- has upcoming sleep study in a couple of weeks.  Plan: VITAMIN D,25 HYDROXY, CANCELED: VITAMIN D,25         HYDROXY       Return 1-2 mos    I have spent 25 minutes in face to face time with this patient/patient proxy of which > 50% was in counseling or coordination of care regarding above issues/Dx.          1. The patient indicates understanding of these issues and agrees with the plan.   No apparent learning  barriers were identified. I attempted to answer any questions regarding the diagnosis and the proposed treatment.  2.  The patient is given an After Visit Summary sheet that lists all of their medications with directions, their allergies, orders placed during this encounter, and follow- up instructions.  3. I reviewed the patient's medical information, allergies and medications. I reviewed the potential side effects of any new medications prescribed.     Electronically signed by: Hyacinth Meeker Petra Kuba, MD, 11/11/2014 10:54 AM  This note is electronically signed in the electronic medical record.

## 2014-11-11 NOTE — Progress Notes (Signed)
[facility] pharmacist called to notify MD that the patient insurance do not cover FLUoxetine (PROZAC) 40 MG capsule, daily  However the insurance will cover FLUoxetine (PROZAC) 20 MG capsule  Pharmacist call for permission to dispense the FLUoxetine (PROZAC) 20 MG capsule, 2 cap daily    RN gave the pharmacist the permission to dispense the  2 cap daily

## 2014-11-12 LAB — HUMAN PAPILLOMAVIRUS (HPV): HUMAN PAPILLOMAVIRUS: NEGATIVE

## 2014-11-13 ENCOUNTER — Encounter (HOSPITAL_BASED_OUTPATIENT_CLINIC_OR_DEPARTMENT_OTHER): Payer: Self-pay | Admitting: Family Medicine

## 2014-11-13 LAB — CYTOPATH, C/V, THIN LAYER

## 2014-11-14 ENCOUNTER — Ambulatory Visit (HOSPITAL_BASED_OUTPATIENT_CLINIC_OR_DEPARTMENT_OTHER): Payer: Medicaid Other | Admitting: Lab

## 2014-11-14 DIAGNOSIS — E6609 Other obesity due to excess calories: Secondary | ICD-10-CM

## 2014-11-14 DIAGNOSIS — R5383 Other fatigue: Secondary | ICD-10-CM

## 2014-11-14 DIAGNOSIS — M25571 Pain in right ankle and joints of right foot: Secondary | ICD-10-CM

## 2014-11-14 DIAGNOSIS — R5381 Other malaise: Secondary | ICD-10-CM

## 2014-11-14 LAB — URIC ACID: URIC ACID: 3.5 mg/dL (ref 2.6–6.0)

## 2014-11-14 LAB — LIPID PANEL
Cholesterol: 154 mg/dL (ref 0–239)
HIGH DENSITY LIPOPROTEIN: 53 mg/dL (ref 40–?)
LOW DENSITY LIPOPROTEIN DIRECT: 84 mg/dL (ref 0–189)
TRIGLYCERIDES: 134 mg/dL (ref 0–150)

## 2014-11-14 LAB — VITAMIN D,25 HYDROXY: VITAMIN D,25 HYDROXY: 19 ng/mL — CL (ref 30.0–100.0)

## 2014-11-14 NOTE — Progress Notes (Signed)
Labs Drawn

## 2014-11-17 LAB — HEMOGLOBIN A1C
ESTIMATED AVERAGE GLUCOSE: 100 (ref 74–160)
HEMOGLOBIN A1C: 5.1 % (ref 4.0–5.6)

## 2014-11-27 ENCOUNTER — Ambulatory Visit (HOSPITAL_BASED_OUTPATIENT_CLINIC_OR_DEPARTMENT_OTHER): Payer: Self-pay | Admitting: Family Medicine

## 2014-11-27 DIAGNOSIS — F329 Major depressive disorder, single episode, unspecified: Principal | ICD-10-CM

## 2014-11-27 DIAGNOSIS — F32A Depression, unspecified: Secondary | ICD-10-CM

## 2014-11-27 NOTE — Addendum Note (Signed)
Addended by: Cheron Schaumann on: 11/27/2014 07:51 PM     Modules accepted: Orders

## 2014-12-05 ENCOUNTER — Encounter (HOSPITAL_BASED_OUTPATIENT_CLINIC_OR_DEPARTMENT_OTHER): Payer: Self-pay | Admitting: Family Medicine

## 2014-12-05 DIAGNOSIS — E559 Vitamin D deficiency, unspecified: Secondary | ICD-10-CM

## 2014-12-05 MED ORDER — VITAMIN D 1000 UNITS PO TABS
2.0000 | ORAL_TABLET | Freq: Every day | ORAL | 12 refills | Status: DC
Start: 2014-12-05 — End: 2014-12-30

## 2014-12-15 ENCOUNTER — Encounter (HOSPITAL_BASED_OUTPATIENT_CLINIC_OR_DEPARTMENT_OTHER): Payer: Self-pay

## 2014-12-15 LAB — SLEEP STUDY

## 2014-12-15 NOTE — Progress Notes (Signed)
PSG was negative for OSA and showed primary snoring only.    Bethenny Losee J. Thurmond Hildebran, MD

## 2014-12-16 ENCOUNTER — Encounter (HOSPITAL_BASED_OUTPATIENT_CLINIC_OR_DEPARTMENT_OTHER): Payer: Self-pay | Admitting: Family Medicine

## 2014-12-30 ENCOUNTER — Ambulatory Visit (HOSPITAL_BASED_OUTPATIENT_CLINIC_OR_DEPARTMENT_OTHER): Payer: Medicaid Other | Admitting: Family Medicine

## 2014-12-30 ENCOUNTER — Encounter (HOSPITAL_BASED_OUTPATIENT_CLINIC_OR_DEPARTMENT_OTHER): Payer: Self-pay | Admitting: Family Medicine

## 2014-12-30 VITALS — BP 126/70 | HR 120 | Temp 98.6°F | Wt 193.2 lb

## 2014-12-30 DIAGNOSIS — F322 Major depressive disorder, single episode, severe without psychotic features: Secondary | ICD-10-CM

## 2014-12-30 DIAGNOSIS — M7918 Myalgia, other site: Secondary | ICD-10-CM

## 2014-12-30 MED ORDER — FLUOXETINE HCL 20 MG PO CAPS
20.0000 mg | ORAL_CAPSULE | Freq: Every day | ORAL | 3 refills | Status: DC
Start: 2014-12-30 — End: 2015-04-29

## 2014-12-30 MED ORDER — CYCLOBENZAPRINE HCL 5 MG PO TABS
5.00 mg | ORAL_TABLET | Freq: Every evening | ORAL | 2 refills | Status: AC | PRN
Start: 2014-12-30 — End: 2015-03-02

## 2014-12-30 MED ORDER — VITAMIN D 1000 UNITS PO TABS
2.0000 | ORAL_TABLET | Freq: Every day | ORAL | 12 refills | Status: DC
Start: 2014-12-30 — End: 2015-10-16

## 2014-12-30 NOTE — Progress Notes (Signed)
SUBJECTIVE:  Kimberly Lindsey is a 34 year old female who presents for f/u depression.    Months of feeling very badly.     Sleepy all day. Feels tension in her back/neck/shoulders, needs daughter to give massage- helps hardly at all.   Can't relax. Very stressed out and overwhelmed with everything. Family is far away in [place]. Frustrated with her weight. Wanted wt loss surgery but may not qualify (recent sleep study for OSA was normal). Taking care of 3 kids, a lot on her plate.    Often forgetful. Sometimes forgets address of where she is going. Family is noticing.  Poor concentration.  Has to write down reminders for herself a lot.  Overeats even when she is not hungry. Sleep is poor.     Painful in digits/stiff in hand in the AM about 30 min.  Pain in knees    Taking prozac, feels less agitated since she has been on it, but hasn't helped with depression.  Hasn't had time for therapy.     Asking about a muscle relaxer and possibly a med to help her weight.    PROBLEMS:   Patient Active Problem List:     Adjustment disorder with mixed anxiety and depressed mood     Obesity     Acute depression      PAST MEDICAL, SURGICAL, FAMILY, SOCIAL, IMMUNIZATION, HEALTH MAINT HISTORY: The following sections have been reviewed and updated in Epic:  past medical history, past surgical history, family history, social history    MEDICATIONS:   Current Outpatient Prescriptions on File Prior to Visit:  cholecalciferol (VITAMIN D3) 1000 UNIT tablet Take 2 tablets by mouth daily Disp: 60 tablet Rfl: 12   FLUoxetine (PROZAC) 40 MG capsule Take 1 capsule by mouth daily New dose. Disp: 30 capsule Rfl: 5     No current facility-administered medications on file prior to visit.      ALLERGIES:  Review of Patient's Allergies indicates:  No Known Allergies    OBJECTIVE:    VITALS:  BP 126/70  Pulse 120  Temp 98.6 F (37 C) (Temporal)  Wt 87.6 kg (193 lb 3.2 oz)  LMP 12/23/2014 (Approximate)  SpO2 99%  BMI 36.5 kg/m2  General:  Well appearing, no apparent distress. Alert and oriented x 3.  Eyes: PERRLA, EOMI. No scleral icterus.  Mouth: MMM, normal oropharynx  Neck: supple, thyroid normal, no significant cervical lymphadenopathy  Heart: regular rate and rhythm, normal S1 and S2, no murmurs, rubs or gallops.  Lungs:  Clear to auscultation bilaterally, no wheezing or rales  Skin: No rash  Neuro: Cranial nerves intact, grossly normal strength and sensation. Normal gait  Extremities: warm, well perfused, no edema  Trap muscles/ neck/paraspinal muscles diffusely tender  Psych: flat affect, normal insight and judgment.     ASSESSMENT/PLAN:   PROBLEMS:   Amunique Neyra is a 34 year old female here for  (F32.2) Severe single current episode of major depressive disorder, without psychotic features (HCC)  (primary encounter diagnosis)/ obesity  Comment:still severe depression, low risk suicide. Increase prozac to /day. Pt seems more open to therapy-- [facility] see if [facility]. Refill Vit D  Suspect this is main reason she is struggling with fatigue/ poor concentration/ poor memory and also trigger for overeating. "eating when not hungry". Discussed no good safe long term med for wt loss and also primary problem is not excess appetite but rather a mood d/o. Unfortunately with BMI of 36 and no comorbidities likely  will not qualify for wt loss surgery which is discouraging for her, however she [facility].  Plan: FLUoxetine (PROZAC) 20 MG capsule,         cholecalciferol (VITAMIN D3) 1000 UNIT tablet    (M79.1) Myofascial pain  Comment: possibly fibromyalgia or just secondary to stress/ physical work.  Less likely RA or other rheum disease. Trial flexeril but start at night because of SE of drowsiness.  Gave handout to read regarding fibromyalgia.  Plan: cyclobenzaprine (FLEXERIL) 5 MG tablet         Return 1 mo to f/u all of these issues.    I have spent 25 minutes in face to face time  with this patient/patient proxy of which > 50% was in counseling or coordination of care regarding above issues/Dx.        1. The patient indicates understanding of these issues and agrees with the plan.   No apparent learning barriers were identified. I attempted to answer any questions regarding the diagnosis and the proposed treatment.  2.  The patient is given an After Visit Summary sheet that lists all of their medications with directions, their allergies, orders placed during this encounter, and follow- up instructions.  3. I reviewed the patient's medical information, allergies and medications. I reviewed the potential side effects of any new medications prescribed.     Electronically signed by: Hyacinth Meekerachel B. Petra KubaVogel, MD, 12/30/2014 2:05 PM  This note is electronically signed in the electronic medical record.

## 2014-12-30 NOTE — Patient Instructions (Addendum)
Síndrome da dor miofascial e fibromialgia  (Myofascial Pain Syndrome and Fibromyalgia)  A síndrome da dor miofascial e a fibromialgia são distúrbios de dor. Essa dor pode ser sentida principalmente nos músculos.   · A síndrome da dor miofascial:    Sempre tem pontos de gatilho ou áreas sensíveis nos músculos que causam dor quando pressionadas. A dor pode ir e vir.    Em geral, afeta áreas do pescoço, da parte superior das costas e dos ombros. A dor frequentemente se irradia para os braços e mãos.  · Fibromialgia:    Inclui dores musculares e sensibilidade que vêm e vão.    Está com frequência associada à fadiga e a distúrbios do sono.    Tem pontos de gatilho.    Tende a ser duradoura (crônica), mas não representa risco de vida.  A fibromialgia e a síndrome da dor miofascial não são a mesma coisa. Elas podem, contudo, ocorrer juntas. Caso você sofra de ambas as doenças, uma pode piorar a outra. Ambas são comuns e podem causar dor e fadiga suficientes para dificultar as atividades diárias.   CAUSAS   As causas exatas da fibromialgia e da síndrome da dor miofascial são desconhecidas. Pessoas com certos tipos de genes podem apresentar maior probabilidade de desenvolver fibromialgia. Alguns fatores podem ser gatilhos para ambos os quadros, tais como:   · Distúrbios da coluna.  · Artrite.  · Dano grave (trauma) e outros fatores de estresse físico.  · Muito estresse.  · Doença médica.  SINAIS E SINTOMAS   Fibromialgia  Os principais sintomas da fibromialgia são dor e sensibilidade disseminadas nos músculos. Elas podem variar com o tempo. A dor é frequentemente descrita como pungente, repentina ou ardente. Você pode sentir também formigamento ou dormência. Você pode experimentar ainda problemas para dormir e fadiga. Você pode acordar se sentindo cansado e zonzo ("fibronévoa"). Outros sintomas podem incluir:   · Problemas intestinais e da bexiga.  · Dores de cabeça.  · Problemas de visão.  · Problemas com odores e  ruídos.  · Depressão e alterações do humor.  · Menstruações dolorosas (dismenorreia).  · Pele ou olhos secos.  A síndrome da dor miofascial:  Os sintomas da síndrome da dor miofascial incluem:   · Feixes musculares tensos e com veias saltadas.  · Sensações musculares desconfortáveis, tais como:    Dor.    Cãibras.    Queimação.    Dormência.    Formigamento.    Fraqueza muscular.  · Problemas para mover certos músculos livremente (amplitude de movimento).  DIAGNÓSTICO   Não existem exames específicos para diagnosticar a fibromialgia ou a síndrome da dor miofascial. Ambas podem ser difíceis de diagnosticar porque os sintomas são comuns em muitos outros quadros. Seu médico poderá suspeitar de uma ou de ambas essas doenças com base nos seus sintomas e histórico médico. Seu médico também fará um exame físico.   A chave do diagnóstico da fibromialgia é apresentar um quadro de dor, fadiga e outros sintomas por mais do que três meses que não possa ser explicado por outro quadro clínico.   A chave do diagnóstico da síndrome da dor miofascial é encontrar pontos de gatilho nos músculos que são sensíveis e que causam dor em outras partes do corpo (dor referida).  TRATAMENTO   O tratamento da fibromialgia e da síndrome da dor miofascial com frequência exige uma equipe de profissionais de saúde. Ele em geral começa com seu médico principal e um fisioterapeuta. Você poderá achar útil trabalhar com provedores   de serviços de saúde alternativos, tais como massoterapeutas e acupunturistas.  O tratamento da fibromialgia pode incluir medicamentos. Esses medicamentos podem incluir anti-inflamatórios não esteroides (AINEs) e outros medicamentos.   O tratamento da síndrome da dor miofascial também pode incluir:  · AINEs.  · Resfriamento e alongamento dos músculos.  · Injeções nos pontos de gatilho.  · Tratamentos com ondas sonoras (ultrassom) para estimular os músculos.  INSTRUÇÕES PARA TRATAMENTO DOMICILIAR   · Tome medicamentos somente  conforme orientado por seu médico.  · Faça exercícios conforme orientado por seu médico ou fisioterapeuta.  · Tente evitar situações estressantes.  · Pratique técnicas de relaxamento para controlar seu estresse. Você poderá querer experimentar:    Biofeedback.    Técnicas de visualização.    Hipnose.    Relaxamento muscular.    Ioga.    Meditação.  · Converse com seu médico sobre tratamentos alternativos, tais como acupuntura ou massagem.  · Mantenha um estilo de vida saudável. Isso inclui ter uma dieta saudável e obter dormir o suficiente.  · Considere se associar a um grupo de apoio.  · Não realize atividades que tensionem ou extenuem os músculos. Isso inclui movimentos repetitivos e levantamento de pesos.  PROCURE UM MÉDICO SE:   · Desenvolver novos sintomas.  · Seus sintomas piorarem.  · Notar efeitos colaterais dos medicamentos.  · Tiver problemas para dormir.  · Seu quadro estiver causando depressão ou ansiedade.  PARA OBTER MAIS INFORMAÇÕES   · National Fibromyalgia Association (Associação Nacional de Fibromialgia): http://www.fmaware.orgwww.fmaware.org  · Arthritis Foundation (Fundação da Artrite): http://www.arthritis.orgwww.arthritis.org  · American Chronic Pain Association (Associação Americana de Dor Crônica): http://www.theacpa.org/condition/myofascial-painwww.theacpa.org/condition/myofascial-pain     Estas informações não se destinam a substituir as recomendações de seu médico. Não deixe de discutir quaisquer dúvidas com seu médico.     Document Released: 02/14/2005 Document Revised: 03/07/2014  Elsevier Interactive Patient Education ©2016 Elsevier Inc.

## 2014-12-31 ENCOUNTER — Telehealth (HOSPITAL_BASED_OUTPATIENT_CLINIC_OR_DEPARTMENT_OTHER): Payer: Self-pay

## 2014-12-31 NOTE — Progress Notes (Signed)
Ellin GoodieNatalia Bracamonte-Moreno, 12/31/2014, 4:13 PM  Writer called pt to outreach for depression, no answer, left voicemail with info abotu [facility] on nov 14th at 6:30pm.

## 2015-01-01 ENCOUNTER — Telehealth (HOSPITAL_BASED_OUTPATIENT_CLINIC_OR_DEPARTMENT_OTHER): Payer: Self-pay

## 2015-01-01 DIAGNOSIS — F329 Major depressive disorder, single episode, unspecified: Principal | ICD-10-CM

## 2015-01-01 DIAGNOSIS — F32A Depression, unspecified: Secondary | ICD-10-CM

## 2015-01-01 NOTE — Progress Notes (Signed)
Ellin GoodieNatalia Bracamonte-Moreno, 01/01/2015, 9:58 AM  Writer called pt to outreach for depression and do a PHQ-9, total 17. Pt said she can sleep all day and doesn't want to go out and keep eating and gaining weight, complains about her knees and that is why she doesn't exercise. Pt recently started taking Fluoxetine 60 mg. Pt weights 194 lbs at this time and sees herself in the mirror and feels that she is not herself.   Pt agreed to go [facility] Nov [facility]th at 6:30pm. Pt also agreed to start short term therapy with [facility] Dirk DressLeah Soumerai on Nov Fri 18th at 10 am. Writer will F/u on the phone.

## 2015-01-01 NOTE — Addendum Note (Signed)
Addended by: Margaree MackintoshVOGEL, Dashay Giesler on: 01/01/2015 10:56 AM     Modules accepted: Orders

## 2015-01-16 ENCOUNTER — Ambulatory Visit (HOSPITAL_BASED_OUTPATIENT_CLINIC_OR_DEPARTMENT_OTHER): Payer: Medicaid Other

## 2015-02-02 ENCOUNTER — Telehealth (HOSPITAL_BASED_OUTPATIENT_CLINIC_OR_DEPARTMENT_OTHER): Payer: Self-pay

## 2015-02-02 NOTE — Progress Notes (Signed)
Ellin GoodieNatalia Lindsey, 02/02/2015, 3:39 PM  Writer called pt to outreach for depression, no answer, left voicemail.

## 2015-03-01 DIAGNOSIS — F322 Major depressive disorder, single episode, severe without psychotic features: Secondary | ICD-10-CM | POA: Insufficient documentation

## 2015-06-24 ENCOUNTER — Encounter (HOSPITAL_BASED_OUTPATIENT_CLINIC_OR_DEPARTMENT_OTHER): Payer: Self-pay | Admitting: Physician Assistant

## 2015-06-24 ENCOUNTER — Ambulatory Visit (HOSPITAL_BASED_OUTPATIENT_CLINIC_OR_DEPARTMENT_OTHER): Payer: Medicaid Other | Admitting: Physician Assistant

## 2015-06-24 DIAGNOSIS — M722 Plantar fascial fibromatosis: Secondary | ICD-10-CM

## 2015-06-24 DIAGNOSIS — M546 Pain in thoracic spine: Secondary | ICD-10-CM

## 2015-06-24 DIAGNOSIS — G4730 Sleep apnea, unspecified: Secondary | ICD-10-CM

## 2015-06-24 DIAGNOSIS — G8929 Other chronic pain: Secondary | ICD-10-CM

## 2015-06-24 DIAGNOSIS — M5442 Lumbago with sciatica, left side: Secondary | ICD-10-CM

## 2015-06-24 LAB — THYROID SCREEN TSH REFLEX FT4: THYROID SCREEN TSH REFLEX FT4: 1.58 u[IU]/mL (ref 0.358–3.740)

## 2015-06-24 MED ORDER — IBUPROFEN 800 MG PO TABS
800.0000 mg | ORAL_TABLET | Freq: Three times a day (TID) | ORAL | 3 refills | Status: DC | PRN
Start: 2015-06-24 — End: 2015-10-27

## 2015-06-24 NOTE — Patient Instructions (Addendum)
Hiperglicemia  (Hyperglycemia)  Hiperglicemia ocorre quando o nível de glicose (açúcar) em seu sangue está alto demais. A hiperglicemia pode acontecer por muitas razões, mas ocorre mais frequentemente a pessoas que não sabem ter diabetes ou que não estão cuidando bem de sua diabete.   CAUSAS  Com ou sem diabetes, há outras causas para a hiperglicemia. O problema pode ocorrer quando você tem diabetes, mas também acontece em outras situações que você pode não estar ciente, como:  Diabetes  · Se você tem diabetes e está tendo dificuldade em controlar seu nível de glicose no sangue, a hiperglicemia pode ocorrer pelas seguintes razões:    Não seguir seu plano alimentar.    Deixar de tomar sua medicação para diabetes, ou não tomá-la da maneira apropriada.    Se exercitar menos ou fazer menos atividades do que o seu usual.    Estar doente.  Pré-diabetes  · Isto não pode ser ignorado. Antes de desenvolver uma diabete tipo 2, uma pessoa quase sempre tem a "pré-diabete". Isso ocorre quando seu nível de glicose no sangue está maior do que o normal, mas ainda não alto o bastante para ser diagnosticado como diabetes. Pesquisas mostraram que alguns danos de longo prazo causados ao corpo, especialmente ao coração e ao sistema circulatório, já podem estar ocorrendo durante a pré-diabete. Se você agir para gerenciar sua glicose no sangue quando ainda tem pré-diabetes, é possível postergar ou evitar o desenvolvimento da diabetes tipo 2.  Stress  · Se você tem diabetes, a doença pode ser administrada através de uma dieta controlada ou de medicamentos orais e insulina. Porém, você pode descobrir que seu nível de glicose no sangue está maior que o normal no hospital, tendo ou não diabetes. Isso é geralmente chamado de "hiperglicemia por stress". O stress pode elevar sua glicose no sangue, graças a hormônios liberados no sangue em situações estressantes. Se o stress for a causa de sua glicose elevada no sangue, o problema pode ser  acompanhado regularmente por seu médico. Dessa forma ele pode assegurar que sua hiperglicemia não continue piorando e acabe se transformando em diabetes.  Esteroides  · Esteroides são medicações que agem nos sistemas de combate a infecções (sistema imunológico) para bloquear inflamações ou infecções. Um efeito colateral pode ser a elevação do nível de glicose no sangue. A maioria das pessoas consegue produzir insulina extra suficiente para absorver esse aumento, mas àqueles que não conseguem, os esteroides fazem o nível de glicose no sangue subir ainda mais. Não é raro um tratamento por esteroides "descobrir" uma diabete que já vinha se desenvolvendo. Nem sempre é possível determinar se a hiperglicemia irá desaparecer após o término do tratamento com esteroides. Um exame de sangue especial, chamado de A1c, é feito algumas vezes para determinar se o seu nível de glicose no sangue estava elevado antes do início dos esteroides.  SINTOMAS  · Sede.  · Urina frequente.  · Boca seca.  · Visão embaçada.  · Cansaço ou fadiga.  · Fraqueza.  · Sonolência.  · Formigamento nos pés ou na perna.  DIAGNÓSTICO  O diagnóstico é feito com o monitoramento da glicose no sangue, usando uma das seguintes maneiras:  · Exame A1c. Este é um elemento químico encontrado em seu sangue.  · Monitoramento de glicose no sangue.  · Resultados de laboratório.  TRATAMENTO  Primeiro, é importante saber a causa da hipoglicemia antes que ela possa ser tratada. O tratamento pode incluir, mas não deve ser limitado a:  · Educação.  · Alterações ou adequações   aos medicamentos.  · Alterações ou adequações ao plano alimentar.  · Tratamento para uma doença, infecção, etc.  · Monitoramento mais frequente do nível de glicose no sangue.  · Alteração em plano de exercícios.  · Redução ou interrupção de esteroides.  · Mudanças no estilo de vida.  INSTRUÇÕES PARA TRATAMENTO DOMICILIAR  · Teste sua glicose no sangue conforme orientações.  · Exercite-se regularmente.  Seu médico lhe dará instruções sobre exercícios. Diabetes ou pré-diabetes causadas pelo stress podem ser curadas com exercícios.  · Coma refeições sadias e balanceadas, em horários fixos e regulares. Seu médico ou nutricionista deve lhe passar um plano alimentar para controlar sua ingestão de açúcar.  · Estar num peso ideal é importante. Se necessário, perder 10 ou 14 libras pode ajudar a melhorar os níveis de glicose no sangue.  PROCURE UM MÉDICO SE:  · Você tiver dúvidas sobre medicações, atividades ou dieta.  · Você continuar com sintomas (problemas como sede muito forte, urina frequente ou ganho de peso).  PROCURE UM MÉDICO IMEDIATAMENTE SE:  · Você estiver vomitando ou tiver diarreia.  · O seu hálito estiver com odor de frutas.  · Você estiver com a respiração mais rápida ou mais lenta.  · Você estiver muito sonolento ou incoerente.  · Você sentir dormência, formigamento ou dores nos pés ou mãos.  · Você sentir dores no peito.  · Os seus sintomas piorarem apesar de você ter seguido as ordens de seu médico.  · Você tiver qualquer outra dúvida ou preocupação.     Estas informações não se destinam a substituir as recomendações de seu médico. Não deixe de discutir quaisquer dúvidas com seu médico.     Document Released: 02/14/2005 Document Revised: 05/09/2011  Elsevier Interactive Patient Education ©2016 Elsevier Inc.

## 2015-06-24 NOTE — Progress Notes (Signed)
Review of Systems   Constitutional: Negative for chills, fever, malaise/fatigue and weight loss.        Weight gain   HENT: Negative for congestion, hearing loss and tinnitus.    Eyes: Negative for blurred vision, double vision and photophobia.   Respiratory: Negative for cough and shortness of breath.    Cardiovascular: Negative for chest pain, palpitations and leg swelling.   Gastrointestinal: Negative for abdominal pain, blood in stool, constipation, diarrhea, heartburn, melena, nausea and vomiting.   Genitourinary: Negative for dysuria, frequency, hematuria and urgency.   Musculoskeletal: Positive for back pain and joint pain. Negative for myalgias.   Skin: Negative for itching and rash.   Neurological: Negative for dizziness, tingling, tremors and headaches.   Psychiatric/Behavioral: The patient has insomnia.             Subjective     Kimberly Lindsey is a 35 year old female presents for with multiple concerns well.   Worried about weight gain - about 45 pounds past 8-10 years, stable for past 1 year.  + fam h/o thyroid disease and diabetes.  Has seen nutritionist. Diet recall:  Breakfast: 1 cup coffee with milk, eggs with 2 small brazilian tortillas. No AM snacks, rarely fruit.  Lunch = rice, beans, chicken, salad,  Dinner = coffee with milk, biscuit.  Exercise - limited 2/2 back pain. Periods irregular. Last ate 5 hours ago.     Pain in midline thoracic and lumbar spine. Sometimes raditates down R leg to ankle/foot.  Had pain last week, radiculopathy resolved.  Xray thoracic spine showed early DJD in 09/2009. Taking ibuprofen 800mg  helps minimize pain but doesn't resolve it. Has to sleep with pillows under legs.  H/o PT 5 years ago.  Pain worse when working, affecting her ability to work.     Not sleeping well, melatonin helps only until 2am.  Problem sleeping ~ 8 years.  Snores at night, HAs at night and in AM, states bf reports witnessing apneic events for past few years.      Also reports knee pain  bilat, no edema. Worse when rising sitting to standing, walking a lot, on her feet for long periods of time.      Plantar fasciitis bilat for "a long time". Injection ~ 3 years ago, no help. Not wearing orthotics      Patient Active Problem List:     Adjustment disorder with mixed anxiety and depressed mood     Obesity     Acute depression      Current Outpatient Prescriptions:  cholecalciferol (VITAMIN D3) 1000 UNIT tablet Take 2 tablets by mouth daily Disp: 60 tablet Rfl: 12     No current facility-administered medications for this visit.   Review of Patient's Allergies indicates:  No Known Allergies  Social History    Marital status: Single              Spouse name:                       Years of education:                 Number of children:               Social History Main Topics    Smoking status: Never Smoker  Smokeless status: Never Used                        Alcohol use: No              Drug use: No              Sexual activity: Yes               Partners with: Female       Comment: H/O abnormal pap after her last C/S, S/P                 cauterization, normal since then.  No H/O                STDs.    Social History Narrative    Lives with the father of her third son - and son        2 other sons are in [place] - ages 41, 45 - live with her mom    Ex husband left her - is not supporting 2 sons - told her she was on her own.        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        Lives in [place] with husband and son.     2 kids in [place]--> came to [place] 2016 to visit.      Past Surgical History:  No date: OB ANTEPARTUM CARE CESAREAN DLVR & POSTPARTUM      Comment: c/s x 3  No date: TUBAL LIGATION    Family History    Hypertension Mother     Diabetes Father     Comment: died of diabetes, also cholesterol    Lipids Mother     Cancer - Other FamHxNeg     Diabetes Mother     Thyroid Maternal Aunt     Comment:  multiple aunts/uncles    Stroke Maternal Grandmother     Comment: died of stroke    Heart Maternal Uncle 35    Comment: MI            Objective     All ROS reviewed and discussed and are otherwise negative unless listed above.         PHYSICAL EXAM:    GENERAL APPEARANCE -  A&Ox3, WDWN, NAD, obese  HEENT - head normocephalic, atraumatic, EOMI, PERRLA, conjunctiva clear bilaterally, no occular discharge. TM bilaterally gray and translucent, without bulging, erythema or exudate. Light reflex visible bilaterally. Nares patent. Nasal mucosa without erythema or visible mass. Pharynx without erythema, exudate or mass.    NECK - Neck soft, supple. No ant/posterior cervical or supraclavicular LAD .  Thyroid ~ 15g with normal consistency, no palpable nodules. No tracheal deviation  LUNGS - Lungs CTATB without WRR. Normal inspiratory effort.    CARDIOVASCULAR -  RRR without S3, S4. No murmurs, clicks, gallops or rubs.  ABDOMEN - Abdomen soft, NTND. BS normal. No masses, no hepatosplenomegaly.  Back - NTTP along spinous processes or paraspinal mm.  No CVA tenderness to percussion  Extrem - no tremor or edema. DTRs + 2 and equal UE and LE bilat. Pulses normal and equal bilat at dorsalis pedis and posterior tibial arteries.  + bilat knee crepitus, SLR neg bilat  SKIN - skin color, texture, temperature, and turgor are normal in the exposed areas without rash or concerning lesions, + acanthosis neckline, axillary folds bilat  Neuro- CN II-XII normal and equal bilat.  Gait and station steady. Muscle tone and strength normal and equal bilat.   Psych -  Appears stated age, well groomed. Behavior calm and appropriate. Mood and affect appropriate. Appears to have good judgement and insight.     ASSESSMENT/PLAN:  1. Morbid obesity due to excess calories (HCC)  Will check labs, counseled re: diet, exercise. Likely wont be able to take weight loss meds 2/2 insurance. Ref: BI to consider bariatric surgery  - Hemoglobin A1c  - Thyroid  Screen TSH Reflex FT4  - REFERRAL TO GENERAL SURGERY (EXT)    2. Chronic midline low back pain with left-sided sciatica  Suspect 2/2 obesity and DJD, will get  Updated xray. May benefit from PT again, would definitely benefit from weight loss. Encouraged swimming/low-impact cardio exercise  - XR Lumbar Spine minimum 4 Vw; Future  - ibuprofen (ADVIL,MOTRIN) 800 MG tablet; Take 1 tablet by mouth every 8 (eight) hours as needed for Pain  Dispense: 60 tablet; Refill: 3    3. Chronic midline thoracic back pain  See above  - XR ROUTINE Thoracic Spine 2 Vw; Future  - ibuprofen (ADVIL,MOTRIN) 800 MG tablet; Take 1 tablet by mouth every 8 (eight) hours as needed for Pain  Dispense: 60 tablet; Refill: 3    4. Plantar fasciitis, bilateral  Discussed exercises, wear low heel, stretches, ice, ibuprofen. Wants to hold off on pod referral at this time, will f/u if worse    5. Sleep apnea, unspecified type  Encouraged weight loss, will get sleep study  - ROUTINE SLEEP STUDY; Future        I have reviewed the past medical, surgical, social and family history and updated these sections of EpicCare as relevant. All interim labs, test results, and consult notes were reviewed and discussed with patient. Medications were reconciled during this visit and a current medication list was given to the patient at the end of the visit.      follow up 3 mo    Janeal Holmes, PA-C

## 2015-06-25 ENCOUNTER — Telehealth (HOSPITAL_BASED_OUTPATIENT_CLINIC_OR_DEPARTMENT_OTHER): Payer: Self-pay | Admitting: Registered Nurse

## 2015-06-25 DIAGNOSIS — R7303 Prediabetes: Secondary | ICD-10-CM

## 2015-06-25 LAB — HEMOGLOBIN A1C
ESTIMATED AVERAGE GLUCOSE: 120 (ref 74–160)
HEMOGLOBIN A1C: 5.8 % — ABNORMAL HIGH (ref 4.0–5.6)

## 2015-06-25 NOTE — Progress Notes (Signed)
TC to pt.   Informed of results.   Advised of referral, informed pt referral coordinator will be in touch with appt info.   Advised of plan and told her to call back PRN, f/u in 3-6 months.   Verbalized understanding and agrees to plan.        Message  Received: Today     Lovina Reachhristina E. Marshall  P Ec Rn Pool                Dear RN,     Please:    1. Create Telephone encounter for this patient.   2. Share with the patient the attached results - thyroid normal, A1c shows sugars are high, but not yet in range of diabetes     Plan:   1. A referral to nutrition will be made. Please counsel re: low carb diet and ask pt to f/u with nutritionist. Resume exercise, goal 10 pound weight loss next 3 months. F/u 3-6 mo     2. Type of Outreach: 3 phone calls and if unable to reach send letter     3. Document the conversation in the Telephone Encounter and send the encounter back to me to complete orders and any further necessary documentation.     Thank you,   [name], PA-C

## 2015-06-25 NOTE — Progress Notes (Signed)
Dear RN,    Please:    1. Create Telephone encounter for this patient.  2. Share with the patient the attached results - thyroid normal, A1c shows sugars are high, but not yet in range of diabetes    Plan:  1. A referral to nutrition will be made.  Please counsel re: low carb diet and ask pt to f/u with nutritionist. Resume exercise, goal 10 pound weight loss next 3 months.  F/u 3-6 mo    2. Type of Outreach: 3 phone calls and if unable to reach send letter    3. Document the conversation in the Telephone Encounter and send the encounter back to me to complete orders and any further necessary documentation.     Thank you,  Janeal Holmeshristina E. Candies Palm, PA-C

## 2015-07-03 ENCOUNTER — Ambulatory Visit: Payer: Self-pay | Admitting: Physician Assistant

## 2015-07-03 DIAGNOSIS — M5442 Lumbago with sciatica, left side: Principal | ICD-10-CM

## 2015-07-03 DIAGNOSIS — M546 Pain in thoracic spine: Secondary | ICD-10-CM

## 2015-07-03 DIAGNOSIS — G8929 Other chronic pain: Secondary | ICD-10-CM

## 2015-07-03 LAB — XR LUMBAR SPINE 2 OR 3 VIEWS

## 2015-07-03 LAB — XR THORACIC SPINE 2 VIEWS

## 2015-07-03 NOTE — Addendum Note (Signed)
Addended by: Rosana HoesIMINICO, Tyja Gortney on: 07/03/2015 01:27 PM     Modules accepted: Orders

## 2015-07-03 NOTE — Progress Notes (Signed)
Dear Lolo   Please:     1. Select the language (Portuguese Brazilian) appropriate Letter Template.       2. Insert the following comments:           "Continue as you are currently doing. If you have further questions, please contact the clinic to schedule an appointment to discuss your results. (Portuguese) Continue do mesmo jeito que você está fazendo atualmente. Se você tiver alguma outra pergunta, entre em contato com a clínica para marcar uma consulta para conversar sobre os resultados.      Signed, Chemeka Filice E. Danyal Adorno, PA-C    "     3. Send the letter to the patient.    Thank you,  Annick Dimaio E. Kylia Grajales, PA-C

## 2015-07-03 NOTE — Progress Notes (Signed)
Dear Kimberly Lindsey   Please:     1. Select the language (Portuguese Brazilian) appropriate Letter Template.       2. Insert the following comments:           "Continue as you are currently doing. If you have further questions, please contact the clinic to schedule an appointment to discuss your results. (Portuguese) Continue do mesmo jeito que você está fazendo atualmente. Se você tiver alguma outra pergunta, entre em contato com a clínica para marcar uma consulta para conversar sobre os resultados.      Signed, Gregory Barrick E. Daiden Coltrane, PA-C    "     3. Send the letter to the patient.    Thank you,  Lyric Hoar E. Savanna Dooley, PA-C

## 2015-07-28 ENCOUNTER — Telehealth (HOSPITAL_BASED_OUTPATIENT_CLINIC_OR_DEPARTMENT_OTHER): Payer: Self-pay | Admitting: Registered Nurse

## 2015-07-28 ENCOUNTER — Telehealth (HOSPITAL_BASED_OUTPATIENT_CLINIC_OR_DEPARTMENT_OTHER): Payer: Self-pay

## 2015-07-28 NOTE — Progress Notes (Signed)
.  Called patient to update PHQ-9 and GAD-7, Care Plan. Updated all of them.  Patient is no longer taking Fluoxetine (Prozac). She stopped taking it by herself. She is concerned to in taking this medication for very long. She is considering to restart it due to her anxiety been high but she would like to see her provider first.   She declined to book an appointment today but will call in the future to book it.     Patient is in the process of bariatric [facility].   [facility] and is waiting for result. She has apnea and believes she sleeps only one hour at night.  Reinforced physical activity. It could help with her sleep an anxiety and diabetes. She agreed. States sometimes she walks  with her husband. She feels tired very often and has to sit during the walk several times due to her weight and shortness of breath.    Patient requested a copy of her Hemoglobin A1C result. Told patient I will ask a nurse to follow up with her. Sent a message to RN pool.

## 2015-07-28 NOTE — Telephone Encounter (Signed)
-----   Message from MidfieldSandra Campos sent at 07/28/2015  4:29 PM EDT -----  Hello,    I called this patient today to update PHQ-9 and Care Plan and   patient requested a copy of her Hemoglobin A1C result and wants to know when she has to repeat the test.  Could you please follow up with her about it?    Thank you,    Bobbe MedicoSandra Campos

## 2015-07-28 NOTE — Progress Notes (Signed)
Called pt, no answer left message to call nurse at [facility] at [phone]      Component      Latest Ref Rng & Units 06/24/2015   HEMOGLOBIN A1C      4.0 - 5.6 % 5.8 (H)   ESTIMATED AVERAGE GLUCOSE      74 - 160 120

## 2015-07-29 NOTE — Progress Notes (Signed)
Spoke with patient using telephone interpreter, Kimberly Lindsey  Relayed results  Can recheck at annual physical or if new symptoms develop  Verbalized back and agrees with plan

## 2015-08-18 ENCOUNTER — Ambulatory Visit (HOSPITAL_BASED_OUTPATIENT_CLINIC_OR_DEPARTMENT_OTHER): Payer: Medicaid Other | Admitting: Registered"

## 2015-10-09 ENCOUNTER — Encounter (HOSPITAL_BASED_OUTPATIENT_CLINIC_OR_DEPARTMENT_OTHER): Payer: Self-pay | Admitting: Physician Assistant

## 2015-10-09 ENCOUNTER — Ambulatory Visit (HOSPITAL_BASED_OUTPATIENT_CLINIC_OR_DEPARTMENT_OTHER): Payer: Medicaid Other | Admitting: Physician Assistant

## 2015-10-09 VITALS — BP 115/77 | HR 103 | Temp 98.1°F | Resp 18 | Ht 60.0 in | Wt 205.0 lb

## 2015-10-09 DIAGNOSIS — R7303 Prediabetes: Secondary | ICD-10-CM

## 2015-10-09 DIAGNOSIS — G44209 Tension-type headache, unspecified, not intractable: Secondary | ICD-10-CM

## 2015-10-09 DIAGNOSIS — F418 Other specified anxiety disorders: Secondary | ICD-10-CM

## 2015-10-09 DIAGNOSIS — R609 Edema, unspecified: Secondary | ICD-10-CM

## 2015-10-09 DIAGNOSIS — E559 Vitamin D deficiency, unspecified: Secondary | ICD-10-CM

## 2015-10-09 LAB — BASIC METABOLIC PANEL
ANION GAP: 8 mmol/L (ref 5–15)
BUN (UREA NITROGEN): 9 mg/dL (ref 7–18)
CALCIUM: 8.6 mg/dL (ref 8.5–10.1)
CARBON DIOXIDE: 27 mmol/L (ref 21–32)
CHLORIDE: 107 mmol/L (ref 98–107)
CREATININE: 0.8 mg/dL (ref 0.4–1.2)
ESTIMATED GLOMERULAR FILT RATE: >60 mL/min (ref >60)
Glucose Random: 125 mg/dL (ref 74–160)
POTASSIUM: 3.7 mmol/L (ref 3.5–5.1)
SODIUM: 142 mmol/L (ref 136–145)

## 2015-10-09 LAB — VITAMIN D,25 HYDROXY: VITAMIN D,25 HYDROXY: 23 ng/mL — ABNORMAL LOW (ref 30.0–100.0)

## 2015-10-09 MED ORDER — HYDROCHLOROTHIAZIDE 25 MG PO TABS
25.0000 mg | ORAL_TABLET | Freq: Every day | ORAL | 2 refills | Status: DC
Start: 2015-10-09 — End: 2015-11-10

## 2015-10-09 NOTE — Progress Notes (Signed)
Review of Systems   Constitutional: Negative for chills, fever, malaise/fatigue and weight loss.   HENT: Negative for congestion, hearing loss and tinnitus.    Eyes: Negative for blurred vision, double vision and photophobia.   Respiratory: Negative for cough and shortness of breath.    Cardiovascular: Positive for leg swelling. Negative for chest pain and palpitations.   Gastrointestinal: Negative for abdominal pain, blood in stool, constipation, diarrhea, heartburn, melena, nausea and vomiting.   Genitourinary: Negative for dysuria, frequency, hematuria and urgency.   Musculoskeletal: Negative for joint pain and myalgias.   Skin: Negative for itching and rash.   Neurological: Positive for headaches. Negative for dizziness, tingling and tremors.   Psychiatric/Behavioral: Positive for depression. The patient is nervous/anxious.             Subjective     Kimberly Lindsey is a 35 year old female presents for f/u pre-DM. Extensive fam h/o DM.     HEMOGLOBIN A1C (%)   Date Value   06/24/2015 5.8 (H)   11/14/2014 5.1   04/17/2013 5.5   [facility], starting to work on low carb diet. Exercising  + LE edema, limiting salt diet    Feeling anxious, nervous. Taking prozac 40mg  1 tab qd (previously on 40mg )    Admits HA  L forehead HA + light sensativity, no aura, n/v. Also with tension HAs frequently. Ibuprofen 800mg  helps, takes a few days weekly.     Took phentermine + topiramate for 1 week, stopped 11 week ago b/c wasn't loosing weight.  [facility].     Most Recent Weight Reading(s)  10/09/15 : 93 kg (205 lb)  06/24/15 : 90.7 kg (200 lb)  12/30/14 : 87.6 kg (193 lb 3.2 oz)  11/11/14 : 86.9 kg (191 lb 9.6 oz)  10/15/14 : 87.1 kg (192 lb)      Patient Active Problem List:     Adjustment disorder with mixed anxiety and depressed mood     Obesity     Acute depression     Plantar fasciitis, bilateral      Current Outpatient Prescriptions:  ibuprofen (ADVIL,MOTRIN) 800 MG tablet Take 1 tablet  by mouth every 8 (eight) hours as needed for Pain Disp: 60 tablet Rfl: 3   cholecalciferol (VITAMIN D3) 1000 UNIT tablet Take 2 tablets by mouth daily Disp: 60 tablet Rfl: 12     No current facility-administered medications for this visit.   Review of Patient's Allergies indicates:  No Known Allergies  Social History    Marital status: Single              Spouse name:                       Years of education:                 Number of children:               Social History Main Topics    Smoking status: Never Smoker                                                                Smokeless status: Never Used  Alcohol use: No              Drug use: No              Sexual activity: Yes               Partners with: Female       Comment: H/O abnormal pap after her last C/S, S/P                 cauterization, normal since then.  No H/O                STDs.    Social History Narrative    Lives with the father of her third son - and son        2 other sons are in [place] - ages 64, 71 - live with her mom    Ex husband left her - is not supporting 2 sons - told her she was on her own.        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        Lives in [place] with husband and son.     2 kids in [place]--> came to [place] 2016 to visit.        06/24/15:    Lives with sons: 42, 27, 71 yo and boyfriend. Feels safe at home    Works as Land      Past Surgical History:  No date: OB ANTEPARTUM CARE CESAREAN DLVR & POSTPARTUM      Comment: c/s x 3  No date: TUBAL LIGATION    Family History    Hypertension Mother     Diabetes Father     Comment: died of diabetes, also cholesterol    Lipids Mother     Cancer - Other FamHxNeg     Diabetes Mother     Thyroid Maternal Aunt     Comment: multiple aunts/uncles    Stroke Maternal Grandmother     Comment: died of stroke    Heart Maternal Uncle 35    Comment: MI            Objective     All ROS reviewed and discussed and are otherwise  negative unless listed above.       PHYSICAL EXAM:  BP 115/77   Pulse 103   Temp 98.1 F (36.7 C) (Temporal)   Resp 18   Ht 5' (1.524 m)   Wt 93 kg (205 lb)   SpO2 97%   BMI 40.04 kg/m2    GENERAL APPEARANCE - A&Ox3, WDWN, NAD  HEENT - head normocephalic, atraumatic, EOMI, PERRLA, conjunctiva clear bilaterally, no occular discharge.   NECK - Neck soft, supple. No ant/posterior cervical or supraclavicular LAD .   LUNGS - Lungs CTATB without WRR. Normal inspiratory effort.   CARDIOVASCULAR -  RRR without S3, S4. No murmurs, clicks, gallops or rubs.  Extrem - no tremor, trace LE pitting edema. DTRs + 2 and equal UE and LE bilat. Pulses normal and equal bilat at dorsalis pedis and posterior tibial arteries  SKIN - skin color, texture, temperature, and turgor are normal in the exposed areas without rash or concerning lesions    A/P:  1. Pre-diabetes  Counseled re: diet, exercise, weight loss. Consider metformin if A1c increasing to also help minimize insulin resistance/weight loss  - Basic Metabolic Panel  - Hemoglobin A1c    2. Tension headache  Pt ed  provided, reviewed otc therapies. Counseled re: prevention. HA journal. F/u prn    3. Depression with anxiety  uncontrolled on prozac , suspect needs 2nd agent, will ref: psychopharm. Refusing psychotherapy now, will consider "later".   - REFERRAL TO ADULT PSYCHIATRY (INT)    4. Pitting edema  Will check labs to be sure renal function normal. No concerns symptoms of cardiac etiology. Counseled re: low salt diet. Will follow  - Basic Metabolic Panel  - hydrochlorothiazide (HYDRODIURIL) 25 MG tablet; Take 1 tablet by mouth daily  Dispense: 30 tablet; Refill: 2    5. Vitamin D deficiency  Will check labs, advise re: dose adjustment pending result  - 25-Vitamin D (Screening)      I have reviewed the past medical, surgical, social and family history and updated these sections of EpicCare as relevant. All interim labs, test results, and consult notes were reviewed and  discussed with patient. Medications were reconciled during this visit and a current medication list was given to the patient at the end of the visit.      follow up 3 mo, prn    Janeal Holmes, PA-C

## 2015-10-09 NOTE — Patient Instructions (Addendum)
Excedrin Tension Headache    Excedrin Migraine    Cefaleia de tenso   (Tension Headache)   A cefaleia de tenso  uma sensao de dor, presso, ou uma dor sentida com frequncia sobre a Biochemist, clinical frente e laterais da cabea. A dor pode ser maante ou pode se sentir um aperto (constrio).  o tipo mais comum de cefaleia. As cefaleias de tenso no esto normalmente associadas com nuseas ou vmito e no pioram com a atividade fsica. As cefaleias de tenso podem durar de 30 minutos a diversos dias.   CAUSAS   A causa exata  desconhecida, mas ela pode ser causada por substncias qumicas e hormnios no crebro que levam  dor. As cefaleias de tenso frequentemente comeam aps um estresse, ansiedade, ou depresso. Outros disparadores podem incluir:    lcool.   Cafena (em excesso ou falta).   Infeces respiratrias (resfriados, gripe, infeces sinusiais).   Problemas dentrios ou estreitamento da arcada dentria.   Fadiga.   Manter sua cabea e pescoo em uma posio por muito tempo ao usar um computador.  SINTOMAS    Presso em torno Charity fundraiser.    Dor maante e forte na cabea.    Dor sentida na parte da frente e laterais da cabea.    Sensibilidade nos msculos da cabea, pescoo e ombros.  DIAGNSTICO   Uma cefaleia de tenso  frequentemente diagnosticada com base em:    Sintomas.    Exame fsico.    Uma varredura por TC ou IRM de sua cabea. Esses exames podem ser solicitados se os sintomas so severos ou incomuns.  TRATAMENTO   Medicamentos podem ser ministrados para ajudar a aliviar os sintomas.   INSTRUES PARA TRATAMENTO DOMICILIAR    Somente tome medicamentos de venda livre ou prescritos para dor, febre ou desconforto conforme orientado por seu mdico.    Deite-se em um quarto escuro e quieto quanto tiver uma cefaleia.    Mantenha um registro para descobrir o que pode estar ativando suas cefaleias. Por exemplo, escreva:   O que voc come ou bebe.   Quanto tempo  dorme.   Quaisquer alteraes em sua dieta ou medicamentos.   Tente massagem ou outras tcnicas de relaxamento.    Sacos de gelo ou calor aplicados  cabea e pescoo podem ser usados. Use-os de 3 a 4 vezes por dia de 15 a 20 minutos por vez, ou conforme necessrio.    Limite o estresse.    Sente-se reto e no tensione seus msculos.    Pare de fumar, se for [employer].   Limite o uso de lcool.   Diminua a quantidade de cafena que ingere ou pare de tom-la.   Coma e se exercite regularmente.   Durma de 7 a 9 horas, ou conforme recomendado por seu mdico.   Evite o uso excessivo de analgsicos, pois podem ocorrer cefaleias recorrentes.   PROCURE UM MDICO SE:    Tiver problemas com os medicamentos prescritos.   Os medicamentos no fizerem efeito.   Tiver uma alterao na cefaleia de costume.   Tiver nusea ou vmitos.  PROCURE UM MDICO IMEDIATAMENTE SE:    A cefaleia piorar.   Tiver febre.   Tiver um torcicolo no pescoo.   Tiver perda de viso.   Tiver fraqueza muscular ou perda de controle muscular.   Comear a perder o equilbrio ou tiver dificuldade para Risk manager.   Se sentir fraco ou prestes a desmaiar.   Desenvolver sintomas graves [employer] seus primeiros  sintomas.     Estas informaes no se destinam a substituir as recomendaes de seu mdico. No deixe de discutir quaisquer dvidas com seu mdico.     Document Released: 02/14/2005 Document Revised: 07/01/2014  Elsevier Interactive Patient Education [employer]2016 Elsevier Inc.

## 2015-10-12 ENCOUNTER — Encounter (HOSPITAL_BASED_OUTPATIENT_CLINIC_OR_DEPARTMENT_OTHER): Payer: Self-pay | Admitting: Physician Assistant

## 2015-10-12 LAB — HEMOGLOBIN A1C
ESTIMATED AVERAGE GLUCOSE: 111 (ref 74–160)
HEMOGLOBIN A1C: 5.5 % (ref 4.0–5.6)

## 2015-10-16 ENCOUNTER — Ambulatory Visit (HOSPITAL_BASED_OUTPATIENT_CLINIC_OR_DEPARTMENT_OTHER): Payer: Medicaid Other | Admitting: Psychosomatic Medicine

## 2015-10-16 ENCOUNTER — Ambulatory Visit (HOSPITAL_BASED_OUTPATIENT_CLINIC_OR_DEPARTMENT_OTHER): Payer: Medicaid Other

## 2015-10-16 DIAGNOSIS — F4310 Post-traumatic stress disorder, unspecified: Secondary | ICD-10-CM | POA: Insufficient documentation

## 2015-10-16 DIAGNOSIS — F411 Generalized anxiety disorder: Secondary | ICD-10-CM | POA: Insufficient documentation

## 2015-10-16 DIAGNOSIS — F419 Anxiety disorder, unspecified: Secondary | ICD-10-CM

## 2015-10-16 LAB — THYROID SCREEN TSH REFLEX FT4: THYROID SCREEN TSH REFLEX FT4: 1.36 u[IU]/mL (ref 0.358–3.740)

## 2015-10-16 LAB — VITAMIN B12: VITAMIN B12: 958 pg/mL (ref 193–986)

## 2015-10-16 MED ORDER — FLUOXETINE HCL 20 MG PO CAPS
20.0000 mg | ORAL_CAPSULE | Freq: Every day | ORAL | 0 refills | Status: DC
Start: 2015-10-16 — End: 2015-11-10

## 2015-10-16 MED ORDER — CHOLECALCIFEROL 25 MCG (1000 UT) PO TABS
4000.0000 [IU] | ORAL_TABLET | Freq: Every day | ORAL | 0 refills | Status: DC
Start: 2015-10-16 — End: 2016-07-07

## 2015-10-16 NOTE — Progress Notes (Signed)
Released open orders.  Pt is a hard stick, must use infant butterfly needle  Routine blood draw, 1 SST tube.  Prepared specimen for courier.  Denyse Amassracey Breen, 10/16/2015, 5:17 PM

## 2015-10-16 NOTE — Patient Instructions (Addendum)
1. Start taking Prozac 20 mg daily  2. Someone will contact you to schedule an appointment with a therapist  3. Get blood drawn to check B12 and thyroid  4. Follow-up with your PCP about arranging for an at-home sleep study  5. Continue to focus on healthy eating including avoiding processed foods and foods high in sugar  6. Start Vitamin D 4000 units daily  7. Continue daily exercise
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
## 2015-10-16 NOTE — Progress Notes (Signed)
Psychiatry consultation attending note:    I saw and evaluated the patient Kimberly Lindsey. Discussed with consultation psychiatry fellow Tobie PoetJustin Lacasse, DO and agree with the findings and plan as documented in his note.    Kimberly NissenHsiang Callan Yontz, MD  Psychiatric Consultant

## 2015-10-19 NOTE — Progress Notes (Deleted)
.    Released open order.  Routine blood draw, 1 SST tube.  Prepared specimen for courier.  Denyse Amassracey Breen, 10/16/2015, 2:20 PM

## 2015-10-26 ENCOUNTER — Ambulatory Visit (HOSPITAL_BASED_OUTPATIENT_CLINIC_OR_DEPARTMENT_OTHER): Payer: Self-pay | Admitting: Physician Assistant

## 2015-10-27 ENCOUNTER — Encounter (HOSPITAL_BASED_OUTPATIENT_CLINIC_OR_DEPARTMENT_OTHER): Payer: Self-pay

## 2015-10-27 ENCOUNTER — Emergency Department (HOSPITAL_BASED_OUTPATIENT_CLINIC_OR_DEPARTMENT_OTHER)
Admission: RE | Admit: 2015-10-27 | Disposition: A | Discharge status: Home / Self Care | Payer: Self-pay | Source: Emergency Department | Attending: Emergency Medicine | Admitting: Emergency Medicine

## 2015-10-27 LAB — CBC, PLATELET & DIFFERENTIAL
ABSOLUTE BASO COUNT: 0 10*3/uL (ref 0.0–0.1)
ABSOLUTE EOSINOPHIL COUNT: 0.2 10*3/uL (ref 0.0–0.8)
ABSOLUTE IMM GRAN COUNT: 0.01 10*3/uL (ref 0.00–0.03)
ABSOLUTE LYMPH COUNT: 2 10*3/uL (ref 0.6–5.9)
ABSOLUTE MONO COUNT: 0.6 10*3/uL (ref 0.2–1.4)
ABSOLUTE NEUTROPHIL COUNT: 5 10*3/uL (ref 1.6–8.3)
BASOPHIL %: 0.4 % (ref 0.0–1.2)
EOSINOPHIL %: 1.9 % (ref 0.0–7.0)
HEMATOCRIT: 38.8 % (ref 34.1–44.9)
HEMOGLOBIN: 12.8 g/dL (ref 11.2–15.7)
IMMATURE GRANULOCYTE %: 0.1 % (ref 0.0–0.4)
LYMPHOCYTE %: 25.5 % (ref 15.0–54.0)
MEAN CORP HGB CONC: 33 g/dL (ref 31.0–37.0)
MEAN CORPUSCULAR HGB: 28.6 pg (ref 26.0–34.0)
MEAN CORPUSCULAR VOL: 86.6 fL (ref 80.0–100.0)
MEAN PLATELET VOLUME: 10.9 fL (ref 8.7–12.5)
MONOCYTE %: 7.1 % (ref 4.0–13.0)
NEUTROPHIL %: 65 % (ref 40.0–75.0)
PLATELET COUNT: 245 10*3/uL (ref 150–400)
RBC DISTRIBUTION WIDTH STD DEV: 37.3 fL (ref 35.1–46.3)
RBC DISTRIBUTION WIDTH: 12.1 % (ref 11.5–14.3)
RED BLOOD CELL COUNT: 4.48 M/uL (ref 3.90–5.20)
WHITE BLOOD CELL COUNT: 7.7 10*3/uL (ref 4.0–11.0)

## 2015-10-27 LAB — BASIC METABOLIC PANEL
ANION GAP: 10 mmol/L (ref 5–15)
BUN (UREA NITROGEN): 15 mg/dL (ref 7–18)
CALCIUM: 9.7 mg/dL (ref 8.5–10.1)
CARBON DIOXIDE: 27 mmol/L (ref 21–32)
CHLORIDE: 101 mmol/L (ref 98–107)
CREATININE: 0.7 mg/dL (ref 0.4–1.2)
ESTIMATED GLOMERULAR FILT RATE: >60 mL/min (ref >60)
Glucose Random: 103 mg/dL (ref 74–160)
POTASSIUM: 3.8 mmol/L (ref 3.5–5.1)
SODIUM: 138 mmol/L (ref 136–145)

## 2015-10-27 LAB — HOLD BLUE TOP TUBE

## 2015-10-27 LAB — HOLD GREEN TOP TUBE

## 2015-10-27 MED ORDER — ACETAMINOPHEN 325 MG PO TABS
650.00 mg | ORAL_TABLET | Freq: Four times a day (QID) | ORAL | 0 refills | Status: AC | PRN
Start: 2015-10-27 — End: 2015-11-26

## 2015-10-27 MED ORDER — IBUPROFEN 600 MG PO TABS
600.00 mg | ORAL_TABLET | Freq: Four times a day (QID) | ORAL | 0 refills | Status: AC | PRN
Start: 2015-10-27 — End: 2015-11-10

## 2015-10-27 MED ORDER — DIPHENHYDRAMINE HCL 50 MG/ML IJ SOLN
25.00 mg | Freq: Once | INTRAMUSCULAR | Status: AC
Start: 2015-10-27 — End: 2015-10-27
  Administered 2015-10-27: 25 mg via INTRAVENOUS
  Filled 2015-10-27: qty 1

## 2015-10-27 MED ORDER — NORMAL SALINE FLUSH 0.9 % IV SOLN
70.00 mL | Freq: Once | INTRAVENOUS | Status: AC
Start: 2015-10-27 — End: 2015-10-27
  Administered 2015-10-27: 70 mL via INTRA_ARTICULAR

## 2015-10-27 MED ORDER — IOHEXOL 350 MG/ML IV SOLN
100.00 mL | Freq: Once | INTRAVENOUS | Status: AC
Start: 2015-10-27 — End: 2015-10-27
  Administered 2015-10-27: 100 mL via INTRAVENOUS

## 2015-10-27 MED ORDER — SODIUM CHLORIDE 0.9 % IV BOLUS
1000.0000 mL | Freq: Once | INTRAVENOUS | Status: AC
  Administered 2015-10-27: 1000 mL via INTRAVENOUS

## 2015-10-27 MED ORDER — KETOROLAC TROMETHAMINE 30 MG/ML INJ
30.00 mg | Freq: Once | Status: AC
Start: 2015-10-27 — End: 2015-10-27
  Administered 2015-10-27: 30 mg via INTRAVENOUS
  Filled 2015-10-27: qty 1

## 2015-10-27 MED ORDER — PROCHLORPERAZINE EDISYLATE 5 MG/ML IJ SOLN
10.00 mg | Freq: Once | INTRAMUSCULAR | Status: AC
Start: 2015-10-27 — End: 2015-10-27
  Administered 2015-10-27: 10 mg via INTRAVENOUS
  Filled 2015-10-27: qty 2

## 2015-10-27 NOTE — ED Provider Notes (Signed)
I performed a history and physical examination of the patient primarily.    Chief Complaint: Headache and neck pain    HPI: This is a 35 year old female  presenting with gradual onset of upper back pain 4 days ago that has progressed into neck pain, posterior head pain that now radiates to the frontal region.  She reports pain is worse with light and better with darkness.  Onset is gradual.  Patient has tried Motrin a few days ago for their symptoms without significant relief.   Arrives to the ED via private vehicle with her husband.  (-)N/V, (+) , posterior neck pain without stiffness, (-)fever, (-)weakness or paresthesias.  Patient states that she is not able to reproduce her neck pain with movement or palpation.  She is not experiencing any speech, balance problems, but feels intermittently mildly vertiginous.  She is somewhat concerned because her father and grandmother both died from aneurysms.      ROS: Pertinent positives were reviewed as per the HPI above. All other systems were reviewed and are negative.    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   Current Outpatient Prescriptions on File Prior to Encounter:  cholecalciferol (VITAMIN D3) 1000 UNIT tablet Take 4 tablets by mouth daily Disp: 120 tablet Rfl: 0   FLUoxetine (PROZAC) 20 MG capsule Take 1 capsule  by mouth daily Disp: 30 capsule Rfl: 0   hydrochlorothiazide (HYDRODIURIL) 25 MG tablet Take 1 tablet by mouth daily Disp: 30 tablet Rfl: 2   ibuprofen (ADVIL,MOTRIN) 800 MG tablet Take 1 tablet by mouth every 8 (eight) hours as needed for Pain Disp: 60 tablet Rfl: 3       Social History:    Social History   Marital status: Single  Spouse name: N/A    Years of education: N/A  Number of children: N/A     Occupational History  None on file     Social History Main Topics   Smoking status: Never Smoker    Smokeless tobacco: Never Used    Alcohol use No    Drug use: No    Sexual activity: Yes    Partners: Male    Comment: H/O abnormal pap after her last C/S, S/P cauterization, normal since then.  No H/O STDs.     Other Topics Concern   None on file     Social History Narrative    Lives with the father of her third son - and son        2 other sons are in [place] - ages 5512, 4410 - live with her mom    Ex husband left her - is not supporting 2 sons - told her she was  on her own.        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        Lives in [place] with husband and son.     2 kids in [place]--> came to [place] 2016 to visit.        06/24/15:    Lives with sons: 34, 76, 12 yo and boyfriend. Feels safe at home    Works as Land     The patient lives with family.      Allergies:  Review of Patient's Allergies indicates:  No Known Allergies    Physical Exam:  BP 143/69   Pulse 66   Temp 98.3 F   Resp 18   LMP 09/14/2015   SpO2 100%    Vital signs reviewed  General: Patient is comfortable appearing   Skin: No rash, good turgor  Eyes: NCAT, EOMI, No icterus, pupils equal  ENT: NL ENT inspection, neck supple, no JVD, mucous membranes moist  Lymph: No palpable nodes  Resp: Lungs clear, no respiratory distress, good air entry  CV: RRR, NL S1S2  GI: Soft, non-tender, no guarding, rebound, or palpable mass.  MS: Non tender, no edema, Distal pulses 2+  Neuro: Gross motor normal, no focal findings,  alert and awake.  Pupils 3 mm, round and reactive.  GU: No CVAT  Psych: Normal affect    Diagnostics:   Pulse Oximetry: 100% on RA indicating adequate oxygenation.  CT Head: Numerous punctate calcifications scattered throughout the brain, no acute findings as read by radiology attending.  CTA Head and neck: No acute disease as read by radiology attending  Labs: Reviewed      ED Course and Medical Decision-making:  Prior records reviewed.  Medication list reviewed.  Nursing note reviewed. Prior to obtaining the patient's history, performing the physical exam and reviewing the diagnostics, initial considerations based on the presenting problem included, but were not limited to ICH, cerebral venous sinus thrombosis, migraine, cluster HA, pseudotumor cerebri, carbon monoxide poisoning, tension HA, cervicogenic headache, glaucoma, neoplasm, vertebral artery or carotid dissection.    Patient was treated with IV fluids,  Compazine 10 mg IV, benadryl 25 mg IV, and toradol with improvement.      On final re-examination patient is stable and clinical status has significantly improved.  Symptoms have resolved.  Non focal neurologic exam.  CT angiogram shows no aneurysm or acute pathology.  CT head does comment on multiple calcifications throughout the brain. ?  Remote neurocysticercosis.  Do not feel that this is related to today's symptoms.  Patient was made aware of these findings and told to follow-up with her PCP or Neurologist in 1-2 days if no improvement and return to the ED for any concerning symptoms.     Reasons to return to the ED were reviewed in detail. The patient agrees with this plan and disposition.  All questions answered.    Diagnoses:  Headache disorder  Abnormal CT scan, head      Condition: Stable, improved.    Disposition:  Discharge home.    Electronically signed by: Delorise Royals Jeanella Craze, DO, 10/27/2015 8:08 PM      This Emergency Department patient encounter note was created using voice-recognition  software and in real time during the ED visit. Please excuse any typographical errors that have not been edited out.

## 2015-10-27 NOTE — ED Triage Note (Signed)
Pt c/o h/a, dizziness, and upper back pain x 4 days. Pt states that her father and grand mother both died from aneurisms.  MD aware and at bedside.

## 2015-10-27 NOTE — Narrator Note (Signed)
Pt to ct 

## 2015-10-27 NOTE — Discharge Instructions (Signed)
General Headache Without Cause  A general headache is pain or discomfort felt around the head or neck area. The cause may not be found.   HOME CARE   · Keep all doctor visits.  · Only take medicines as told by your doctor.  · Lie down in a dark, quiet room when you have a headache.  · Keep a journal to find out if certain things bring on headaches. For example, write down:    What you eat and drink.    How much sleep you get.    Any change to your diet or medicines.  · Relax by getting a massage or doing other relaxing activities.  · Put ice or heat packs on the head and neck area as told by your doctor.  · Lessen stress.  · Sit up straight. Do not tighten (tense) your muscles.  · Quit smoking if you smoke.  · Lessen how much alcohol you drink.  · Lessen how much caffeine you drink, or stop drinking caffeine.  · Eat and sleep on a regular schedule.  · Get 7 to 9 hours of sleep, or as told by your doctor.  · Keep lights dim if bright lights bother you or make your headaches worse.  GET HELP RIGHT AWAY IF:   · Your headache becomes really bad.  · You have a fever.  · You have a stiff neck.  · You have trouble seeing.  · Your muscles are weak, or you lose muscle control.  · You lose your balance or have trouble walking.  · You feel like you will pass out (faint), or you pass out.  · You have really bad symptoms that are different than your first symptoms.  · You have problems with the medicines given to you by your doctor.  · Your medicines do not work.  · Your headache feels different than the other headaches.  · You feel sick to your stomach (nauseous) or throw up (vomit).     This information is not intended to replace advice given to you by your health care provider. Make sure you discuss any questions you have with your health care provider.     Document Released: 11/24/2007 Document Revised: 07/01/2014 Document Reviewed: 02/04/2011  Elsevier Interactive Patient Education ©2016 [employer]

## 2015-10-27 NOTE — Narrator Note (Signed)
Patient Disposition    Patient education for diagnosis, medications, activity, diet and follow-up.  Patient left ED 10:26 PM.  Patient rep received written instructions.  Interpreter to provide instructions: No    Patient belongings with patient: YES    Have all existing LDAs been addressed? Yes    Have all IV infusions been stopped? Yes    Discharged to: Discharged to home

## 2015-10-28 LAB — CTA NECK W &/OR WO CONTRAST

## 2015-10-28 LAB — CT HEAD WO CONTRAST

## 2015-10-28 LAB — CTA HEAD W &/OR WO CONTRAST

## 2015-10-29 ENCOUNTER — Encounter (HOSPITAL_BASED_OUTPATIENT_CLINIC_OR_DEPARTMENT_OTHER): Payer: Self-pay | Admitting: Licensed Practical Nurse

## 2015-10-29 NOTE — Telephone Encounter (Signed)
ED follow up call:  RN outreach call to patient who was seen at emergency room for headache disorder  I was able to speak with patient who states that she is little better.A follow up appointment schedule for 11/10/15.

## 2015-10-30 ENCOUNTER — Other Ambulatory Visit (HOSPITAL_BASED_OUTPATIENT_CLINIC_OR_DEPARTMENT_OTHER): Payer: Self-pay | Admitting: Internal Medicine

## 2015-10-30 DIAGNOSIS — F3289 Other specified depressive episodes: Secondary | ICD-10-CM

## 2015-11-10 ENCOUNTER — Encounter (HOSPITAL_BASED_OUTPATIENT_CLINIC_OR_DEPARTMENT_OTHER): Payer: Self-pay | Admitting: Physician Assistant

## 2015-11-10 ENCOUNTER — Ambulatory Visit (HOSPITAL_BASED_OUTPATIENT_CLINIC_OR_DEPARTMENT_OTHER): Payer: Medicaid Other | Admitting: Physician Assistant

## 2015-11-10 VITALS — BP 127/68 | HR 87 | Temp 99.0°F | Resp 15 | Ht 60.0 in | Wt 206.0 lb

## 2015-11-10 DIAGNOSIS — F419 Anxiety disorder, unspecified: Secondary | ICD-10-CM

## 2015-11-10 DIAGNOSIS — R609 Edema, unspecified: Secondary | ICD-10-CM

## 2015-11-10 DIAGNOSIS — G44209 Tension-type headache, unspecified, not intractable: Secondary | ICD-10-CM | POA: Insufficient documentation

## 2015-11-10 DIAGNOSIS — B690 Cysticercosis of central nervous system: Secondary | ICD-10-CM | POA: Insufficient documentation

## 2015-11-10 MED ORDER — IBUPROFEN 800 MG PO TABS: 800 mg | tablet | Freq: Three times a day (TID) | ORAL | 2 refills | 0 days | Status: AC | PRN

## 2015-11-10 MED ORDER — FLUOXETINE HCL 10 MG PO CAPS: 10 mg | capsule | Freq: Every day | ORAL | 2 refills | 0 days | Status: DC

## 2015-11-10 MED ORDER — FLUOXETINE HCL 10 MG PO CAPS
10.0000 mg | ORAL_CAPSULE | Freq: Every day | ORAL | 2 refills | Status: DC
Start: 2015-11-10 — End: 2016-03-22

## 2015-11-10 MED ORDER — IBUPROFEN 800 MG PO TABS
800.00 mg | ORAL_TABLET | Freq: Three times a day (TID) | ORAL | 2 refills | Status: AC | PRN
Start: 2015-11-10 — End: 2016-02-08

## 2015-11-10 MED ORDER — FLUOXETINE HCL 20 MG PO CAPS: 20 mg | capsule | Freq: Every day | ORAL | 0 refills | 0 days | Status: DC

## 2015-11-10 MED ORDER — FLUOXETINE HCL 20 MG PO CAPS
20.0000 mg | ORAL_CAPSULE | Freq: Every day | ORAL | 0 refills | Status: DC
Start: 2015-11-10 — End: 2015-12-24

## 2015-11-10 MED ORDER — HYDROCHLOROTHIAZIDE 25 MG PO TABS: 25 mg | tablet | Freq: Every day | ORAL | 2 refills | 0 days | Status: DC

## 2015-11-10 MED ORDER — HYDROCHLOROTHIAZIDE 25 MG PO TABS
25.0000 mg | ORAL_TABLET | Freq: Every day | ORAL | 2 refills | Status: DC
Start: 2015-11-10 — End: 2017-04-18

## 2015-11-10 MED FILL — HYDROCHLOROT 25MG: 30 days supply | Qty: 30 | Fill #0 | Status: CP

## 2015-11-10 MED FILL — IBUPROFEN 800MG: 10 days supply | Qty: 30 | Fill #0 | Status: CP

## 2015-11-10 MED FILL — FLUOXETINE   20MG: 30 days supply | Qty: 30 | Fill #0 | Status: CP

## 2015-11-10 MED FILL — *FLUOXETINE 10MG: 30 days supply | Qty: 30 | Fill #0 | Status: CP

## 2015-11-10 NOTE — Progress Notes (Signed)
Review of Systems   Constitutional: Negative for chills, fever, malaise/fatigue and weight loss.   HENT: Negative for congestion, hearing loss and tinnitus.    Eyes: Negative for blurred vision, double vision and photophobia.   Respiratory: Negative for cough and shortness of breath.    Cardiovascular: Negative for chest pain, palpitations and leg swelling.   Gastrointestinal: Negative for abdominal pain, blood in stool, constipation, diarrhea, heartburn, melena, nausea and vomiting.   Genitourinary: Negative for dysuria, frequency, hematuria and urgency.   Musculoskeletal: Negative for joint pain and myalgias.   Skin: Negative for itching and rash.   Neurological: Negative for dizziness, tingling, tremors and headaches.            Subjective     Kimberly Lindsey is a 35 year old female presents with concern re: HAs. Recent ED visit at [facility] 8/29 for HA x 4 days, started as upper back pain and progressed superiorly with photosensitivity and didzziness. Was concerned due to fam h/o aneurysms in father and grandmother.  Was seen by me 8/11 for headaches, dx: tension HAs and provided pt ed, advised HA journal, did not bring to visit. At ED visit had CT head performed, showed "multiple punctate calcifications consistent with sequelae of old infection, particularly cysticercosis."  Admits h/o infection as child with tapeworm ~ age 49 yo, was treated "with very strong medication".  No symptoms now.     HA past 3 days, better with acetaminophen. States "HA happens when my BP goes up'.  Has checked BPs at pharmacy, was 165/60 last week, usually premenstrually. Not checking home BPs.     Has been applying ice, working on relaxation, improving her sleep, limits noise, drinking more water.  Takes acetaminophen prn which is helpful, sometimes with ibuprofen (also taking for her back pain).  Getting HAs, start ~ 4 days before menses, resolving ~ day 2 of menses. Usually generalized HAs, pulling sensation with neck pain,  occipital pain, sometimes with dizziness without n/v, visual changes.   hasn't tried heat, no stretching, is walking for exercise. No yoga or pilates  ibupropen 600mg  x 2 tabs sometimes with HAs    Fluoxetine 20mg  for anxiety, much better, sometimes a little anxious. Sometimes still not sleeping well.  GAD 7 =  11    On HCTZ 25mg , BMP normal 10/27/15    Patient Active Problem List:     Adjustment disorder with mixed anxiety and depressed mood     Obesity     Acute depression     Plantar fasciitis, bilateral     Post traumatic stress disorder (PTSD)     Anxiety      Current Outpatient Prescriptions:  ibuprofen (ADVIL,MOTRIN) 600 MG tablet Take 1 tablet by mouth every 6 (six) hours as needed for Pain or Fever Disp: 30 tablet Rfl: 0   acetaminophen (TYLENOL) 325 MG tablet Take 2 tablets by mouth every 6 (six) hours as needed for Pain Disp: 20 tablet Rfl: 0   cholecalciferol (VITAMIN D3) 1000 UNIT tablet Take 4 tablets by mouth daily Disp: 120 tablet Rfl: 0   FLUoxetine (PROZAC) 20 MG capsule Take 1 capsule by mouth daily Disp: 30 capsule Rfl: 0   hydrochlorothiazide (HYDRODIURIL) 25 MG tablet Take 1 tablet by mouth daily Disp: 30 tablet Rfl: 2     No current facility-administered medications for this visit.   Review of Patient's Allergies indicates:  No Known Allergies  Social History    Marital status: Single  Spouse name:                       Years of education:                 Number of children:               Social History Main Topics    Smoking status: Never Smoker                                                                Smokeless status: Never Used                        Alcohol use: No              Drug use: No              Sexual activity: Yes               Partners with: Female       Comment: H/O abnormal pap after her last C/S, S/P                 cauterization, normal since then.  No H/O                STDs.    Social History Narrative    Lives with the father of her third son - and son         2 other sons are in [place] - ages 41, 60 - live with her mom    Ex husband left her - is not supporting 2 sons - told her she was on her own.        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        Lives in [place] with husband and son.     2 kids in [place]--> came to [place] 2016 to visit.        06/24/15:    Lives with sons: 38, 29, 81 yo and boyfriend. Feels safe at home    Works as Land      Past Surgical History:  No date: OB ANTEPARTUM CARE CESAREAN DLVR & POSTPARTUM      Comment: c/s x 3  No date: TUBAL LIGATION    Family History    Hypertension Mother     Diabetes Father     Comment: died of diabetes, also cholesterol    Lipids Mother     Cancer - Other FamHxNeg     Diabetes Mother     Thyroid Maternal Aunt     Comment: multiple aunts/uncles    Stroke Maternal Grandmother     Comment: died of stroke    Heart Maternal Uncle 35    Comment: MI            Objective     All ROS reviewed and discussed and are otherwise negative unless listed above.       PHYSICAL EXAM:  BP 127/68   Pulse 87   Temp 99 F (37.2 C) (Temporal)   Resp 15   Ht 5' (1.524 m)   Wt 93.4 kg (206 lb)   LMP 11/04/2015  SpO2 99%   BMI 40.23 kg/m2    GENERAL APPEARANCE - A&Ox3, WDWN, NAD  HEENT - head normocephalic, atraumatic, EOMI, PERRLA, conjunctiva clear bilaterally, no occular discharge. TM bilaterally gray and translucent, without bulging, erythema or exudate.   NECK - Neck soft, supple. No ant/posterior cervical or supraclavicular LAD . FROM.  bilat posterior neck mm and shoulder/upper back mm tightness without palpable spasm  LUNGS - Lungs CTATB without WRR. Normal inspiratory effort.    CARDIOVASCULAR -  RRR without S3, S4. No murmurs, clicks, gallops or rubs.  Extrem - no tremor. Trace LE pitting edema. DTRs + 2 and equal UE and LE bilat. Pulses normal and equal bilat at dorsalis pedis and posterior tibial arteries  SKIN - skin color, texture, temperature, and turgor are normal in the  exposed areas without rash or concerning lesions    A/P:  1. Tension headache  excedrin tension HA if not improving with ibuprofen, start PT, symptom journal. Reviewed stress relief therapies, encouraged exercise and weight loss, which I suspect will help decrease frequency of her HAs.   - REFERRAL TO PHYSICAL THERAPY ( INT)  - ibuprofen (ADVIL,MOTRIN) 800 MG tablet; Take 1 tablet by mouth every 8 (eight) hours as needed for Pain NO MORE THAN 3 TABS PER 24 HR  Dispense: 30 tablet; Refill: 2    2. Neurocysticercosis  Previously treated, not anemic or symptomatic. No further follow up needed. Provided pt encouragement.     3. Pitting edema  Etiology unclear, suspect dependent edema 2/2 obesity. Pt is asx for CHF with no cardiac hx. Renal function normal on labs 10/27/15  Encouraged low salt diet, hydration, elevation. Consider compression stockings, encouraged weight loss. RN visit x2 for BP checks in next 1 mo to be sure normal on HCTZ. Recheck BMP next visit, RTO 2-3 mo  RTO 3 mo- hydrochlorothiazide (HYDRODIURIL) 25 MG tablet; Take 1 tablet by mouth daily  Dispense: 30 tablet; Refill: 2    4. Anxiety  Uncontrolled, increase prozac to 30mg  daily, RTO 2-3 mo for f/u. Contact if issues/concerns. Encouraged to consider psychotherapy, no willing to try this now, will readdress next visit.   - FLUoxetine (PROZAC) 20 MG capsule; Take 1 capsule by mouth daily  Dispense: 30 capsule; Refill: 0  - FLUoxetine (PROZAC) 10 MG capsule; Take 1 capsule by mouth daily To be taken with 20mg  tabs for TDD 30 mg  Dispense: 30 capsule; Refill: 2        I have reviewed the past medical, surgical, social and family history and updated these sections of EpicCare as relevant. All interim labs, test results, and consult notes were reviewed and discussed with patient. Medications were reconciled during this visit and a current medication list was given to the patient at the end of the visit.      follow up 2-3 mo, sooner prn    Janeal Holmeshristina E.  Kass Herberger, PA-C

## 2015-11-10 NOTE — Progress Notes (Signed)
Patient feels safe at home.

## 2015-11-10 NOTE — Patient Instructions (Addendum)
EXCEDRIN TENSION Headache medicine

## 2015-12-24 ENCOUNTER — Other Ambulatory Visit (HOSPITAL_BASED_OUTPATIENT_CLINIC_OR_DEPARTMENT_OTHER): Payer: Self-pay | Admitting: Internal Medicine

## 2015-12-24 DIAGNOSIS — F419 Anxiety disorder, unspecified: Secondary | ICD-10-CM

## 2015-12-24 NOTE — Telephone Encounter (Signed)
-----   Message from Natalia LeatherwoodSteve Montaque sent at 12/24/2015  1:25 PM EDT -----  Regarding: Rx   Kimberly Lindsey [phone], 35 year old, female    Calls today:  Refill    !! Before starting refill request, check EPIC to see if encounter for this medication already exists !!    (May list multiple medications in this section)  Medicine Name: FLUoxetine (PROZAC) 20 MG capsule  Dosage:   Frequency (how many pills, how many times a day):   Number of pills left:     Documented patient preferred pharmacies:    [facility]  Phone: [phone] Fax: [phone]  Person calling on behalf of patient:     CALL BACK NUMBER:   Best time to call back:   Cell phone:   Other phone:    Patient's language of care: [place]    Patient     Patient's PCP: Rayburn GoLeah A. Zallman, MD

## 2015-12-24 NOTE — Progress Notes (Signed)
PER Pharmacy, Kimberly Lindsey is a 35 year old female has requested a refill of fluoxetine 20    Last Office Visit: 11-10-15 with [name]   Last Physical Exam: 10-15-14    Other Med Adult:  Most Recent BP Reading(s)  11/10/15 : 127/68          Cholesterol (mg/dL)   Date Value   64/33/295109/16/2016 154   ----------    LOW DENSITY LIPOPROTEIN DIRECT (mg/dL)   Date Value   88/41/660609/16/2016 84   ----------    HIGH DENSITY LIPOPROTEIN (mg/dL)   Date Value   30/16/010909/16/2016 53   ----------    TRIGLYCERIDES (mg/dL)   Date Value   32/35/573209/16/2016 134   ----------        THYROID SCREEN TSH REFLEX FT4 (uIU/mL)   Date Value   10/16/2015 1.360   ----------      No results found for: TSH      HEMOGLOBIN A1C (%)   Date Value   10/09/2015 5.5   ----------      No results found for: INR      SODIUM (mmol/L)   Date Value   10/27/2015 138   ----------      POTASSIUM (mmol/L)   Date Value   10/27/2015 3.8   ----------          CREATININE (mg/dL)   Date Value   20/25/427008/29/2017 0.7   ----------      Documented patient preferred pharmacies:    [facility]  Phone: [phone] Fax: [phone]

## 2015-12-24 NOTE — Progress Notes (Signed)
Kimberly DavenportSandra can you call pt and see how she is doing on increased dose?

## 2015-12-25 ENCOUNTER — Telehealth (HOSPITAL_BASED_OUTPATIENT_CLINIC_OR_DEPARTMENT_OTHER): Payer: Self-pay

## 2015-12-25 MED ORDER — FLUOXETINE HCL 20 MG PO CAPS: 20 mg | capsule | Freq: Every day | ORAL | 3 refills | 0 days | Status: DC

## 2015-12-25 MED ORDER — FLUOXETINE HCL 20 MG PO CAPS
20.0000 mg | ORAL_CAPSULE | Freq: Every day | ORAL | 3 refills | Status: DC
Start: 2015-12-25 — End: 2016-07-06

## 2015-12-25 MED FILL — FLUOXETINE   20MG: 30 days supply | Qty: 30 | Fill #0 | Status: CP

## 2015-12-25 NOTE — Progress Notes (Signed)
.  On behalf of Dr. Michele RockersZallman called patient to follow up with medication Fluoxetine (Prozac) 20 mg + 10 mg. (total of 30 mg).   Patient reports medication helps her a lot. "I feel less nervous, more patient. Without meds I feel explosive with my kids and my husband".   Patient states her medication ended two days ago. Requesting another refill.  PHQ-9 = 5 today.     States been walking every night with her husband and her dog. She is in process of bariatric surgery at [facility].

## 2015-12-28 MED FILL — *FLUOXETINE 10MG: 30 days supply | Qty: 30 | Fill #1 | Status: CP

## 2016-02-08 ENCOUNTER — Telehealth (HOSPITAL_BASED_OUTPATIENT_CLINIC_OR_DEPARTMENT_OTHER): Payer: Self-pay | Admitting: Internal Medicine

## 2016-02-08 MED FILL — *FLUOXETINE 10MG: 30 days supply | Qty: 30 | Fill #2 | Status: CP

## 2016-02-08 MED FILL — FLUOXETINE   20MG: 30 days supply | Qty: 30 | Fill #1 | Status: CP

## 2016-02-08 NOTE — Telephone Encounter (Signed)
-----   Message from JacksonJunia Coelho sent at 02/08/2016 11:25 AM EST -----  Regarding: Refill  Contact: [phone]  Blenda Nicelyatricia Klindt [phone], 35 year old, female    Calls today:  Refill    !! Before starting refill request, check EPIC to see if encounter for this medication already exists !!    (May list multiple medications in this section)  Medicine Name: FLUoxetine (PROZAC) 20 MG capsule     Documented patient preferred pharmacies:    [facility]  Phone: [phone] Fax: [phone]  Person calling on behalf of patient: Patient (self)    CALL BACK NUMBER: [phone]  Best time to call back: now  Cell phone:   Other phone:    Patient's language of care: [place]    Patient does not need an interpreter.    Patient's PCP: Rayburn GoLeah A. Zallman, MD

## 2016-02-08 NOTE — Progress Notes (Signed)
[facility] confirmed that Prozac 20 mg has 11 active refills remaining. No refill is required at this time

## 2016-02-11 ENCOUNTER — Ambulatory Visit (HOSPITAL_BASED_OUTPATIENT_CLINIC_OR_DEPARTMENT_OTHER): Payer: Medicaid Other

## 2016-02-11 DIAGNOSIS — F411 Generalized anxiety disorder: Secondary | ICD-10-CM
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
## 2016-02-16 ENCOUNTER — Other Ambulatory Visit (HOSPITAL_BASED_OUTPATIENT_CLINIC_OR_DEPARTMENT_OTHER): Payer: Self-pay

## 2016-02-16 NOTE — Progress Notes (Signed)
02/23/2016 - Thank you for your referral. We have been trying to contact your patient and were not able to do so. If patient expresses interest in psychotherapy, please have her call [phone].          02/18/2018 - A second message was left for patient today to contact [facility] team at [phone] to schedule an appointment.        02/16/2016 - Thank you for your referral. We were unable to reach your patient today, a message was left to call [facility] at [phone] to schedule an appointment.

## 2016-02-25 ENCOUNTER — Ambulatory Visit (HOSPITAL_BASED_OUTPATIENT_CLINIC_OR_DEPARTMENT_OTHER): Payer: Medicaid Other

## 2016-02-25 DIAGNOSIS — F411 Generalized anxiety disorder: Secondary | ICD-10-CM

## 2016-02-25 NOTE — Progress Notes (Signed)
OUTPATIENT PSYCHIATRY PROGRESS NOTE  [facility], [address] [place], [place], [phone]    INTERPRETER: No    CONTACT INFO FOR OTHER AGENCIES AND MENTAL HEALTH PROVIDERS (IF APPLICABLE): N/A    PROBLEM(S) ADDRESSED IN THIS SESSION: anxiety symptoms    SUBJECTIVE  TODAY'S CHIEF COMPLAINT AND CLINICAL UPDATES IN PATIENT'S WORDS:    1) Chief Complaint (Patient and/or guardian's own words, concerns and expressed thoughts): "Sleep is still a problem. I'm probably sleeping about 2-4 hours a night. I get in bed and I can't fall asleep. Everyone in my house is sleeping and I just lay in bed or sometimes got check on my kids and make sure that the covers are on them."    2) New information from patient and/or collateral (Patient's illness: context, course, modifying factors, severity, cultural, family, social, medical history): See below.    OBJECTIVE  DATA REVIEWED (Consider medical labs, radiology, other medical tests; screening/outcome measures; psychological testing; discussion of test results with other clinicians; consultation with other clinicians and systems involved with patient, summary of old records): N/A    CURRENT MEDICATIONS (make clear medications prescribed by psychiatry; include OTC medications):   FLUoxetine (PROZAC) 20 MG capsule, Take 1 capsule by mouth daily In addition to 10mg  for total daily dose of 30mg .    hydrochlorothiazide (HYDRODIURIL) 25 MG tablet, Take 1 tablet by mouth daily    FLUoxetine (PROZAC) 10 MG capsule, Take 1 capsule by mouth daily To be taken with 20mg  tabs for TDD 30 mg    cholecalciferol (VITAMIN D3) 1000 UNIT tablet, Take 4 tablets by mouth daily    MEDICATION ADHERENCE (including barriers and how addressed): n/a    MEDICATION SIDE EFFECTS (Prescribers Only): N/A    BIRTH CONTROL (ask females and males):     CURRENT PREGNANCY: No    MENTAL STATUS EXAMINATION    General Appearance: Within normal limits  Interaction with  Interviewer (eye contact, attitude, behavior): good eye contact; engaged throughout the session.    Physical Signs  Gait and Station (how patient walks and stands): Within normal limits   Physical Appearance: Within normal limits   Normal Movements:Within normal limits    Speech(rate, volume, articulation):Within normal limits  Language: Within normal limits     Mood: anxious    Affect: mood congruent     Thought Process  Rate; concrete vs abstract reasoning: Within normal limits   Logical vs illogical; associations: loose, tangential, circumstantial, intact: Within normal limits     Thought Content  Normal Thought Content (other than safety):Within normal limits  Perceptions (auditory, visual, tactile, etc.): Within normal limits     Impulse Control: Within normal limits    Cognition (Link to [facility])  Orientation (person, place, time):Within normal limits   Recent and remote memory: Within normal limits  Attention span and concentration: Within normal limits  Fund of knowledge, awareness of current events and vocabulary: Within normal limits    Judgment: Within normal limits     Insight: Within normal limits    Suicidality/Homicidality/Aggression (Victimization or Perpetration):Pt denied SI/HI    ASSESSMENT  Today's Assessment: Pt reported that she is having difficulties with being able to fall asleep. She also described feeling anxious on most days than not and impacting her overall ability to relax or get things done around the house. Furthermore, this clinician and pt called the PMH as they have been calling pt to schedule an initial intake with a PMH provider. Pt was able to receive an  appt in January.      Risk Level Assessment  Risk Level Change (if yes, please describe):No  Suicide: low (1)  Violence: low (1)  Addiction: low (1)    DIAGNOSES ASSESSED TODAY (psychiatric diagnoses and medical diagnoses that factor into management of psychiatric treatment):  Generalized Anxiety Disorder    CLINICAL FORMULATION(Make changes as your understanding changes. Should coincide with treatment plan): Pt is a 35 year old woman from [place] referred for short-term treatment by her PCP, Dr. Michele RockersZallman, due to anxiety symptoms. At intake, pt reported a series of clinical symptoms, including: feeling anxious and on edge nearly every day; becoming easily irritated and upset; having difficulties with falling asleep due to worrying thoughts about her children, "unable to turn off thoughts"; difficulties with getting out of bed; hyperphysiological arousal (increased heart rate, shallow breathing, trembling hands); and passive thoughts about not being alive. Although pt endorsed passive SI, she denied a plan or intent. Given pt's clinical symptoms and adverse childhood traumas, pt is likely to benefit from longer-term services, which pt requested. She was informed that she will be connected to PMH and will see this clinician in the interim. She was informed about the limits to confidentiality.      REVIEWING TODAY'S VISIT  CLINICAL INTERVENTIONS TODAY: pt's clinical symptoms were assessed; sleep hygiene was discussed and a behavioral plan to help pt with sleep was created; pt agreed to follow through with this behavioral plan.    PATIENT'S RESPONSE TO INTERVENTIONS: pt was open, receptive, and engaged.     PROGRESS TOWARDS GOALS: Increase pt's awareness about her clinical symptoms and develop coping techniques to help pt manage these symptoms.    Time spent in psychotherapy: 45 mins      PLAN  PLAN FOR MANAGING RISK (Consider risk plan for patients at moderate or high risk for suicide/violence/addiction; medication plan; referrals, etc. Must coincide with treatment plan.): n/a    PLAN FOR ONGOING TREATMENT: 1) continue to learn and use coping techniques to help pt manage her clinical symptoms.    INFORMED CONSENT (for any new treatment): limits of confidentiality were  reviewed during intake.     Providence LaniusVincenzo G. Jacek Colson, Psy.D.

## 2016-02-29 HISTORY — PX: GASTRIC BYPASS: SHX52

## 2016-03-17 ENCOUNTER — Ambulatory Visit (HOSPITAL_BASED_OUTPATIENT_CLINIC_OR_DEPARTMENT_OTHER): Payer: Medicaid Other

## 2016-03-17 DIAGNOSIS — F411 Generalized anxiety disorder: Secondary | ICD-10-CM

## 2016-03-17 NOTE — Progress Notes (Signed)
OUTPATIENT PSYCHIATRY PROGRESS NOTE  [facility], [address] [place], [place], [phone]    INTERPRETER: No    CONTACT INFO FOR OTHER AGENCIES AND MENTAL HEALTH PROVIDERS (IF APPLICABLE): N/A    PROBLEM(S) ADDRESSED IN THIS SESSION: anxiety symptoms    SUBJECTIVE  TODAY'S CHIEF COMPLAINT AND CLINICAL UPDATES IN PATIENT'S WORDS:    1) Chief Complaint (Patient and/or guardian's own words, concerns and expressed thoughts): "The sleep problem has been better. Your technique has really helped. I've been reading as you've said and only get in bed when I'm really, really tired. I was able to sleep throughout the night by the third night."    2) New information from patient and/or collateral (Patient's illness: context, course, modifying factors, severity, cultural, family, social, medical history): See below.    OBJECTIVE  DATA REVIEWED (Consider medical labs, radiology, other medical tests; screening/outcome measures; psychological testing; discussion of test results with other clinicians; consultation with other clinicians and systems involved with patient, summary of old records): N/A    CURRENT MEDICATIONS (make clear medications prescribed by psychiatry; include OTC medications):   FLUoxetine (PROZAC) 20 MG capsule, Take 1 capsule by mouth daily In addition to 10mg  for total daily dose of 30mg .    hydrochlorothiazide (HYDRODIURIL) 25 MG tablet, Take 1 tablet by mouth daily    FLUoxetine (PROZAC) 10 MG capsule, Take 1 capsule by mouth daily To be taken with 20mg  tabs for TDD 30 mg    cholecalciferol (VITAMIN D3) 1000 UNIT tablet, Take 4 tablets by mouth daily    MEDICATION ADHERENCE (including barriers and how addressed): n/a    MEDICATION SIDE EFFECTS (Prescribers Only): N/A    BIRTH CONTROL (ask females and males):     CURRENT PREGNANCY: No    MENTAL STATUS EXAMINATION    General Appearance: Within normal limits  Interaction with Interviewer (eye contact, attitude,  behavior): good eye contact; engaged throughout the session.    Physical Signs  Gait and Station (how patient walks and stands): Within normal limits   Physical Appearance: Within normal limits   Normal Movements:Within normal limits    Speech(rate, volume, articulation):Within normal limits  Language: Within normal limits     Mood: euthymic    Affect: mood congruent     Thought Process  Rate; concrete vs abstract reasoning: Within normal limits   Logical vs illogical; associations: loose, tangential, circumstantial, intact: Within normal limits     Thought Content  Normal Thought Content (other than safety):Within normal limits  Perceptions (auditory, visual, tactile, etc.): Within normal limits     Impulse Control: Within normal limits    Cognition (Link to [facility])  Orientation (person, place, time):Within normal limits   Recent and remote memory: Within normal limits  Attention span and concentration: Within normal limits  Fund of knowledge, awareness of current events and vocabulary: Within normal limits    Judgment: Within normal limits     Insight: Within normal limits    Suicidality/Homicidality/Aggression (Victimization or Perpetration):Pt denied SI/HI    ASSESSMENT  Today's Assessment: Pt reported that she has been following through with this clinician's sleep hygiene practice. Pt indicated that her sleep has consequently improved and pt is able to get a full night of uninterrupted sleep. Pt confirmed that she received a call from the [facility] to start therapy. Pt will be reaching out to the [facility] to schedule an appt.     Risk Level Assessment  Risk Level Change (if yes, please describe):No  Suicide: low (  1)  Violence: low (1)  Addiction: low (1)    DIAGNOSES ASSESSED TODAY (psychiatric diagnoses and medical diagnoses that factor into management of psychiatric treatment): Generalized Anxiety Disorder    CLINICAL FORMULATION(Make changes as your understanding  changes. Should coincide with treatment plan): Pt is a 36 year old woman from [place] referred for short-term treatment by her PCP, Dr. Michele RockersZallman, due to anxiety symptoms. At intake, pt reported a series of clinical symptoms, including: feeling anxious and on edge nearly every day; becoming easily irritated and upset; having difficulties with falling asleep due to worrying thoughts about her children, "unable to turn off thoughts"; difficulties with getting out of bed; hyperphysiological arousal (increased heart rate, shallow breathing, trembling hands); and passive thoughts about not being alive. Although pt endorsed passive SI, she denied a plan or intent. Given pt's clinical symptoms and adverse childhood traumas, pt is likely to benefit from longer-term services, which pt requested. She was informed that she will be connected to [facility] and will see this clinician in the interim. She was informed about the limits to confidentiality.      REVIEWING TODAY'S VISIT  CLINICAL INTERVENTIONS TODAY: pt's clinical symptoms were assessed; behavioral plan to help pt sleep was reviewed and pt was encouraged to continue exercising said plan; pt's treatment with this clinician was summarized.     PATIENT'S RESPONSE TO INTERVENTIONS: pt was open, receptive, and engaged.     PROGRESS TOWARDS GOALS: Increase pt's awareness about her clinical symptoms and develop coping techniques to help pt manage these symptoms.    Time spent in psychotherapy: 45 mins      PLAN  PLAN FOR MANAGING RISK (Consider risk plan for patients at moderate or high risk for suicide/violence/addiction; medication plan; referrals, etc. Must coincide with treatment plan.): n/a    PLAN FOR ONGOING TREATMENT: pt will meet with her new therapist in the [facility].     INFORMED CONSENT (for any new treatment): limits of confidentiality were reviewed during intake.     Providence LaniusVincenzo G. Brian Kocourek, Psy.D.

## 2016-03-22 ENCOUNTER — Other Ambulatory Visit (HOSPITAL_BASED_OUTPATIENT_CLINIC_OR_DEPARTMENT_OTHER): Payer: Self-pay | Admitting: Physician Assistant

## 2016-03-22 DIAGNOSIS — F419 Anxiety disorder, unspecified: Secondary | ICD-10-CM

## 2016-03-22 MED FILL — FLUOXETINE   20MG: 30 days supply | Qty: 30 | Fill #2 | Status: CP

## 2016-03-22 NOTE — Progress Notes (Signed)
[facility], Kimberly Nicelyatricia Tilghman is a 36 year old female has requested a refill of fluoxetine 10 mg.      Last Office Visit: 11/10/15 with Shonna Chockhristina Marshall  Last Physical Exam: 10/15/14      Other Med Adult:  Most Recent BP Reading(s)  11/10/15 : 127/68          Cholesterol (mg/dL)   Date Value   16/10/960409/16/2016 154   ----------    LOW DENSITY LIPOPROTEIN DIRECT (mg/dL)   Date Value   54/09/811909/16/2016 84   ----------    HIGH DENSITY LIPOPROTEIN (mg/dL)   Date Value   14/78/295609/16/2016 53   ----------    TRIGLYCERIDES (mg/dL)   Date Value   21/30/865709/16/2016 134   ----------        THYROID SCREEN TSH REFLEX FT4 (uIU/mL)   Date Value   10/16/2015 1.360   ----------      No results found for: TSH      HEMOGLOBIN A1C (%)   Date Value   10/09/2015 5.5   ----------    No results found for: POCA1C      No results found for: INR      SODIUM (mmol/L)   Date Value   10/27/2015 138   ----------      POTASSIUM (mmol/L)   Date Value   10/27/2015 3.8   ----------          CREATININE (mg/dL)   Date Value   84/69/629508/29/2017 0.7   ----------      Documented patient preferred pharmacies:    [facility]  Phone: [phone] Fax: [phone]

## 2016-03-25 ENCOUNTER — Ambulatory Visit (HOSPITAL_BASED_OUTPATIENT_CLINIC_OR_DEPARTMENT_OTHER): Payer: Medicaid Other

## 2016-07-06 ENCOUNTER — Encounter (HOSPITAL_BASED_OUTPATIENT_CLINIC_OR_DEPARTMENT_OTHER): Payer: Self-pay | Admitting: Physician Assistant

## 2016-07-06 ENCOUNTER — Ambulatory Visit (HOSPITAL_BASED_OUTPATIENT_CLINIC_OR_DEPARTMENT_OTHER): Payer: Medicaid Other | Admitting: Physician Assistant

## 2016-07-06 VITALS — BP 118/74 | HR 100 | Temp 98.3°F | Resp 16 | Wt 167.0 lb

## 2016-07-06 DIAGNOSIS — F4323 Adjustment disorder with mixed anxiety and depressed mood: Secondary | ICD-10-CM

## 2016-07-06 DIAGNOSIS — Z599 Problem related to housing and economic circumstances, unspecified: Secondary | ICD-10-CM

## 2016-07-06 DIAGNOSIS — Z5900 Homelessness unspecified: Secondary | ICD-10-CM

## 2016-07-06 DIAGNOSIS — F411 Generalized anxiety disorder: Secondary | ICD-10-CM

## 2016-07-06 DIAGNOSIS — M5442 Lumbago with sciatica, left side: Principal | ICD-10-CM

## 2016-07-06 DIAGNOSIS — Z6832 Body mass index (BMI) 32.0-32.9, adult: Secondary | ICD-10-CM

## 2016-07-06 DIAGNOSIS — G8929 Other chronic pain: Secondary | ICD-10-CM

## 2016-07-06 DIAGNOSIS — M5441 Lumbago with sciatica, right side: Principal | ICD-10-CM

## 2016-07-06 DIAGNOSIS — Z9884 Bariatric surgery status: Secondary | ICD-10-CM

## 2016-07-06 DIAGNOSIS — E669 Obesity, unspecified: Secondary | ICD-10-CM

## 2016-07-06 LAB — BASIC METABOLIC PANEL
ANION GAP: 7 mmol/L (ref 5–15)
BUN (UREA NITROGEN): 13 mg/dL (ref 7–18)
CALCIUM: 9.4 mg/dL (ref 8.5–10.1)
CARBON DIOXIDE: 26 mmol/L (ref 21–32)
CHLORIDE: 107 mmol/L (ref 98–107)
CREATININE: 0.7 mg/dL (ref 0.4–1.2)
ESTIMATED GLOMERULAR FILT RATE: >60 mL/min (ref >60)
Glucose Random: 108 mg/dL (ref 74–160)
POTASSIUM: 4.2 mmol/L (ref 3.5–5.1)
SODIUM: 140 mmol/L (ref 136–145)

## 2016-07-06 LAB — IRON: IRON: 69 ug/dL (ref 50–170)

## 2016-07-06 LAB — MAGNESIUM: MAGNESIUM: 2.2 mg/dL (ref 1.8–2.4)

## 2016-07-06 LAB — VITAMIN D,25 HYDROXY: VITAMIN D,25 HYDROXY: 19 ng/mL — CL (ref 30.0–100.0)

## 2016-07-06 LAB — VITAMIN B12: VITAMIN B12: >2000 pg/mL — ABNORMAL HIGH (ref 193–986)

## 2016-07-06 MED ORDER — FLUOXETINE HCL 10 MG PO CAPS: capsule | ORAL | 1 refills | 0 days | Status: DC

## 2016-07-06 MED ORDER — FLUOXETINE HCL 10 MG PO CAPS
ORAL_CAPSULE | ORAL | 1 refills | Status: DC
Start: 2016-07-06 — End: 2016-08-03

## 2016-07-06 MED FILL — FLUOXETINE 10MG: 30 days supply | Qty: 60 | Fill #0 | Status: CP

## 2016-07-06 NOTE — Progress Notes (Signed)
Review of Systems   Constitutional: Negative for chills, fever, malaise/fatigue and weight loss.   HENT: Negative for congestion, hearing loss and tinnitus.    Eyes: Negative for blurred vision, double vision and photophobia.   Respiratory: Negative for cough and shortness of breath.    Cardiovascular: Negative for chest pain, palpitations and leg swelling.   Gastrointestinal: Negative for abdominal pain, blood in stool, constipation, diarrhea, heartburn, melena, nausea and vomiting.   Genitourinary: Negative for dysuria, frequency, hematuria and urgency.   Musculoskeletal: Positive for back pain. Negative for joint pain and myalgias.   Skin: Negative for itching and rash.   Neurological: Negative for dizziness, tingling, tremors and headaches.            Subjective     Kimberly Lindsey is a 36 year old female presents with concern re: lumbar back pain, chronic > 10 years. Previously taken ibuprofen 800mg . Pain is intermittent, ~ 3-4 days weekly. Avoiding tylenol b/c doesn't like to take take medication.  Sometimes radiates to to L>R buttocks, lateral thighs.     Previously on fluoxetine 40mg  for anxiety and depression, stopped 2/2 bariatric surgery (laproscopic gastric bypass) 03/23/16/ wants to resume.  Admits fatigue, anhedonia.  PHQ9 = 7, GAD7 = 15, no SI/HI. Happy with weight loss since surgery  Most Recent Weight Reading(s)  07/06/16 : 75.8 kg (167 lb)  11/10/15 : 93.4 kg (206 lb)  10/09/15 : 93 kg (205 lb)  06/24/15 : 90.7 kg (200 lb)  12/30/14 : 87.6 kg (193 lb 3.2 oz)    HTN - no longer on HCTZ since surgery. Watching diet, staying hydrated.     Patient Active Problem List:     Adjustment disorder with mixed anxiety and depressed mood     Obesity     Acute depression     Plantar fasciitis, bilateral     Post traumatic stress disorder (PTSD)     GAD (generalized anxiety disorder)     Neurocysticercosis     Tension headache      Current Outpatient Prescriptions:  FLUoxetine (PROZAC) 10 MG capsule Office  visit needed for further refills Disp: 30 capsule Rfl: 2   FLUoxetine (PROZAC) 20 MG capsule Take 1 capsule by mouth daily In addition to 10mg  for total daily dose of 30mg . Disp: 90 capsule Rfl: 3   hydrochlorothiazide (HYDRODIURIL) 25 MG tablet Take 1 tablet by mouth daily Disp: 30 tablet Rfl: 2   cholecalciferol (VITAMIN D3) 1000 UNIT tablet Take 4 tablets by mouth daily Disp: 120 tablet Rfl: 0     No current facility-administered medications for this visit.   Review of Patient's Allergies indicates:  No Known Allergies  Social History    Marital status: Single              Spouse name:                       Years of education:                 Number of children:               Social History Main Topics    Smoking status: Never Smoker  Smokeless tobacco: Never Used                        Alcohol use: No              Drug use: No              Sexual activity: Yes               Partners with: Female       Comment: H/O abnormal pap after her last C/S, S/P                 cauterization, normal since then.  No H/O                STDs.    Social History Narrative    Lives with the father of her third son - and son        2 other sons are in [place] - ages 4912, 3610 - live with her mom    Ex husband left her - is not supporting 2 sons - told her she was on her own.        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        Lives in [place] with husband and son.     2 kids in [place]--> came to [place] 2016 to visit.        06/24/15:    Lives with sons: 238, 5918, 38 yo and boyfriend. Feels safe at home    Works as Landhousecleaner      Past Surgical History:  No date: OB ANTEPARTUM CARE CESAREAN DLVR & POSTPARTUM      Comment: c/s x 3  No date: TUBAL LIGATION    Family History    Hypertension Mother     Diabetes Father     Comment: died of diabetes, also cholesterol    Lipids Mother     Cancer - Other FamHxNeg     Diabetes Mother     Thyroid  Maternal Aunt     Comment: multiple aunts/uncles    Stroke Maternal Grandmother     Comment: died of stroke    Heart Maternal Uncle 35    Comment: MI            Objective     All ROS reviewed and discussed and are otherwise negative unless listed above.       PHYSICAL EXAM:  BP 118/74  Pulse 100  Temp 98.3 F (36.8 C) (Oral)  Resp 16  Wt 75.8 kg (167 lb)  LMP 07/05/2016 (Exact Date)  SpO2 99%  BMI 32.61 kg/m2    GENERAL APPEARANCE - A&Ox3, WDWN, NAD  HEENT - head normocephalic, atraumatic, EOMI, PERRLA, conjunctiva clear bilaterally, no occular discharge. TM bilaterally gray and translucent, without bulging, erythema or exudate. Light reflex visible bilaterally. Nares patent. Nasal mucosa without erythema or visible mass. Pharynx without erythema, exudate or mass.    NECK - Neck soft, supple. No ant/posterior cervical or supraclavicular LAD .   LUNGS - Lungs CTATB without WRR. Normal inspiratory effort.   CARDIOVASCULAR -  RRR without S3, S4. No murmurs, clicks, gallops or rubs.  Back - NTTP along spinous processes or paraspinal mm.  No CVA tenderness. + tight lumbar mm bilat. SLR neg  Extrem - no tremor or edema. DTRs + 2 and equal UE and LE bilat. Pulses normal and equal bilat at dorsalis  pedis and posterior tibial arteries  SKIN - skin color, texture, temperature, and turgor are normal in the exposed areas without rash or concerning lesions    A/P:    1. Chronic midline low back pain with bilateral sciatica  Is MSK etiology, should improve with weight loss. encouraged PT. Tylenol, heat, stretching. Consider ortho vs updated imaging if not improved after PT. Contact if new/worsening symptoms  - REFERRAL TO PHYSICAL THERAPY ( INT)      2. GAD (generalized anxiety disorder)  Restart fluoxetine 10mg , reviewed SE/risk/benefits. f/u 6 weeks. Reviewed stress relief therapies. Contact if issues/concerns. Refusing psychotherapy referral at this time    3. Adjustment disorder with mixed anxiety and depressed mood  See  above    4. Class 1 obesity without serious comorbidity with body mass index (BMI) of 32.0 to 32.9 in adult, unspecified obesity type  Diet, exercise, weight loss. Pt s/p gastric bypass, continue with surgical team    5. S/P gastric bypass  Will check labs, continue current vitamins pending reuslts  - Multiple Vitamin (MULTIVITAMIN) TABS; Take 1 tablet by mouth daily  - MAGNESIUM CITRATE PO; Take by mouth  - Cyanocobalamin (B-12 PO); Take by mouth  - VITAMIN B12  - VITAMIN D,25 HYDROXY  - IRON  - MAGNESIUM  - BASIC METABOLIC PANEL    6. Problem related to housing and economic circumstances  Referral to pt resource coordinator    7. Homelessness  I suspect this is a false positive on screening, resource coordinator to address  - REFERRAL TO PATIENT RESOURCE COORDINATORS (INT)      I have reviewed the past medical, surgical, social and family history and updated these sections of EpicCare as relevant. All interim labs, test results, and consult notes were reviewed and discussed with patient. Medications were reconciled during this visit and a current medication list was given to the patient at the end of the visit.      follow up 6 weeks    Janeal Holmes, PA-C

## 2016-07-06 NOTE — Progress Notes (Signed)
Pt feels safe at home.  Denyse Amassracey Breen, 07/06/2016, 3:07 PM

## 2016-07-07 ENCOUNTER — Telehealth (HOSPITAL_BASED_OUTPATIENT_CLINIC_OR_DEPARTMENT_OTHER): Payer: Self-pay

## 2016-07-07 ENCOUNTER — Telehealth (HOSPITAL_BASED_OUTPATIENT_CLINIC_OR_DEPARTMENT_OTHER): Payer: Self-pay | Admitting: Registered Nurse

## 2016-07-07 ENCOUNTER — Encounter (HOSPITAL_BASED_OUTPATIENT_CLINIC_OR_DEPARTMENT_OTHER): Payer: Self-pay

## 2016-07-07 DIAGNOSIS — E559 Vitamin D deficiency, unspecified: Secondary | ICD-10-CM

## 2016-07-07 MED ORDER — VITAMIN D-3 5000 UNITS PO TABS: 1 | tablet | Freq: Every day | ORAL | 11 refills | 0 days | Status: AC

## 2016-07-07 MED ORDER — VITAMIN D-3 125 MCG (5000 UT) PO TABS
1.0000 | ORAL_TABLET | Freq: Every day | ORAL | 11 refills | Status: DC
Start: 2016-07-07 — End: 2018-01-31

## 2016-07-07 NOTE — Progress Notes (Addendum)
----- Message -----  From: Kimberly Lindsey  Sent: 07/07/2016   9:30 AM  To: Clelia SchaumannPrc  Subject: Order for Blenda NicelySOUZA,Aleah                     [id number]  Etcheverry,Kennetha                          02-Jun-1980  F      [address]    [place] [id number]    Home: [phone]       Work:       Primary Care Provider: Binnie KandLeah Zallman    Phone: [phone]    Procedure Requested    9099     REFERRAL TO PATIENT RESOURCE COORDIN* [#[id number]][#[id number]]      Priority: Routine  Class: Internal referral      Cosign required by MARSHALL, CHRISTINA[CEMARSHALL]      Comment:Cynethia Chales AbrahamsSouza is being referred to the patient resource               coordinator for assistance with getting resources for the following:                              What is your housing situation today?: I have housing today, but I                am worried about losing housing in the future.                               Within the past 12 months the food you bought just didn't last and                you didn't have money to get more.: Never True                               Within the past 12 months has the electric, gas or oil company                threatened to shut off services in your home?: No                              Within the past 12 months did you skip medications to save money?:                No                               Do you have trouble getting transportation to medical                appointments?: No                              Are you unemployed and looking for work?: Yes                              Would you like help connecting to resources? (Please check the                appropriate answers):  (blank)  Associated Diagnoses      Z59.0 Homelessness      Interpreter Needed? -> [place]         If this is a high-risk referral that you want tracked by the referral   coordinators, please mark "Tracked".  Otherwise select "Non-Tracked". ->                                                                         Tracked         Social needs:  -> Housing Stabilization           -> Other     Ordering User:Lindsey, TRACEY  Authorizing Provider:Christina Ancil Boozer  Department:[facility]

## 2016-07-07 NOTE — Progress Notes (Signed)
Dear RN,    Please:    1. Create Telephone encounter for this patient.  2. Share with the patient the following abnormal results: labs from visit all normal except low vit D and very high B12     Plan:  1. Decrease B12 supplement to only taking every OTHER day and f/u with bariatric surgeon as scheduled . Should recheck levels in a month or two. Please confirm how much vitamin D pt is taking so I can adjust dose    2. Type of Outreach: 3 phone calls and if unable to reach send letter    3. Document the conversation in the Telephone Encounter and send the encounter back to me to complete orders and any further necessary documentation.     Thank you,  Janeal Holmeshristina E. Karn Derk, PA-C

## 2016-07-07 NOTE — Progress Notes (Signed)
This Patient Resources Coordinator [facility]) called to get more information about 'housing instability'. Pt reported on the Connect-S pilot questionnaire that "she has housing today but worries about losing housing in the future".    According to pt, [place] found a house in [place] that they liked very much and offered the owner to rent it with an affordable rent in exchanged for fixing the house, that was in extremely bad situation. Husband fixed the house. Landlord never complained about the rent because they are good tenants taking care of the house and paying the monthly rent always on time. Family has good relationship with neighbors, never had a conflict, until the realty broker in charge to sell the next door house approached them saying that neighbors are upset with the trailer they have parked on their yard, and that they are considering to legally request its removal. Pt and husband had the impression that broker indirectly saying that she should move out.     Pt is very anxious with the situation because they are undocumented.    [facility] told pt that the story presented by the broker doesn't seem true  First, the trailer is on their yard not on neighbor property.  Second, if they have good relationship with their neighbors, pt son is close friend of many of the kids, some of them would certainly approach them about the issue. Third, and most important, only the landlord can approach them about any issue related to moving out of the house. [facility] advised pt that they should not allow the broker to harass them, and if she approaches again with same issue tell him that they only discuss issues related to the house with the landlord.

## 2016-07-07 NOTE — Progress Notes (Signed)
Message   Received: Today   Message Contents   Kimberly Lindsey  P Ec Rn Pool            Dear RN,     Please:    1. Create Telephone encounter for this patient.   2. Share with the patient the following abnormal results: labs from visit all normal except low vit D and very high B12     Plan:   1. Decrease B12 supplement to only taking every OTHER day and f/u with bariatric surgeon as scheduled . Should recheck levels in a month or two. Please confirm how much vitamin D pt is taking so I can adjust dose     2. Type of Outreach: 3 phone calls and if unable to reach send letter     3. Document the conversation in the Telephone Encounter and send the encounter back to me to complete orders and any further necessary documentation.     Thank you,   Kimberly Holmeshristina E. Marshall, PA-C    Kimberly MunroeSara Sven Pinheiro, RN, 07/07/2016, 2:15 PM  TC to pt.  Reviewed results and plan per PA.  Currently NOT take a Vitamin D script.  Takes an MVI only.  Advised provider will send new script for a daily Vitamin D.  [facility].  Agrees to B12 qod and will f/u with bariatric surgeon next month.    Kimberly:  [facility].  Close encounter when you are done.  Thank you

## 2016-07-07 NOTE — Progress Notes (Signed)
This Patient Resources Coordinator [facility]) called to get more information about 'housing instability'. Pt reported on the Connect-S pilot questionnaire that "she has housing today but worries about losing housing in the future".

## 2016-08-03 ENCOUNTER — Ambulatory Visit (HOSPITAL_BASED_OUTPATIENT_CLINIC_OR_DEPARTMENT_OTHER): Payer: Medicaid Other | Admitting: Physician Assistant

## 2016-08-03 ENCOUNTER — Encounter (HOSPITAL_BASED_OUTPATIENT_CLINIC_OR_DEPARTMENT_OTHER): Payer: Self-pay | Admitting: Physician Assistant

## 2016-08-03 VITALS — BP 102/60 | HR 81 | Temp 98.9°F | Resp 14 | Wt 164.0 lb

## 2016-08-03 DIAGNOSIS — F331 Major depressive disorder, recurrent, moderate: Secondary | ICD-10-CM

## 2016-08-03 DIAGNOSIS — F411 Generalized anxiety disorder: Secondary | ICD-10-CM

## 2016-08-03 DIAGNOSIS — F4310 Post-traumatic stress disorder, unspecified: Secondary | ICD-10-CM

## 2016-08-03 MED ORDER — FLUOXETINE HCL 10 MG PO CAPS
10.0000 mg | ORAL_CAPSULE | Freq: Every day | ORAL | 2 refills | Status: DC
Start: 2016-08-03 — End: 2017-04-18

## 2016-08-03 MED ORDER — FLUOXETINE HCL 10 MG PO CAPS: 10 mg | capsule | Freq: Every day | ORAL | 2 refills | 0 days | Status: DC

## 2016-08-03 MED ORDER — FLUOXETINE HCL 20 MG PO CAPS
20.0000 mg | ORAL_CAPSULE | Freq: Every day | ORAL | 2 refills | Status: DC
Start: 2016-08-03 — End: 2017-04-18

## 2016-08-03 MED ORDER — FLUOXETINE HCL 20 MG PO CAPS: 20 mg | capsule | Freq: Every day | ORAL | 2 refills | 0 days | Status: DC

## 2016-08-03 MED FILL — FLUOXETINE   20MG: 30 days supply | Qty: 30 | Fill #0 | Status: CP

## 2016-08-03 MED FILL — VITAMIN D3 5000UNIT: 30 days supply | Qty: 30 | Fill #0 | Status: CP

## 2016-08-03 MED FILL — FLUOXETINE   10MG: 30 days supply | Qty: 30 | Fill #0 | Status: CP

## 2016-08-03 NOTE — Progress Notes (Signed)
Review of Systems   Constitutional: Negative for chills, fever, malaise/fatigue and weight loss.   HENT: Negative for congestion, hearing loss and tinnitus.    Eyes: Negative for blurred vision, double vision and photophobia.   Respiratory: Negative for cough and shortness of breath.    Cardiovascular: Negative for chest pain, palpitations and leg swelling.   Gastrointestinal: Negative for abdominal pain, blood in stool, constipation, diarrhea, heartburn, melena, nausea and vomiting.   Genitourinary: Negative for dysuria, frequency, hematuria and urgency.   Musculoskeletal: Negative for joint pain and myalgias.   Skin: Negative for itching and rash.   Neurological: Negative for dizziness, tingling, tremors and headaches.   Psychiatric/Behavioral: Positive for depression. Negative for hallucinations, substance abuse and suicidal ideas. The patient is nervous/anxious. The patient does not have insomnia.             Subjective   Kimberly Lindsey is a 36 year old female presents f/u MDD, GAD. Restarted prozac last visit, is taking 20 mg, has been on this dose x 1.5 weeks (up from 10mg ). Tolerating well without side effects.   Admits symptoms improving but still feeling bad. PHQ9 = 14, states feeling "more down due to some personal stuff." Stressed that 17 yo daughter came home last night after taking "a lot of drugs (thinks marijuana), this is not the first time this has happened." Not in family counseling. Worried about younger son, 52 yo who is aware of this b/c 75 yo son also using drugs (thinks marijuana). Husband is supportive, talks to her mother for support.  Thinks "this is just the beginning of the abyss for them." no SI/HI    Patient Active Problem List:     Adjustment disorder with mixed anxiety and depressed mood     Obesity     Acute depression     Plantar fasciitis, bilateral     Post traumatic stress disorder (PTSD)     GAD (generalized anxiety disorder)     Neurocysticercosis     Tension  headache      Current Outpatient Prescriptions:  Cholecalciferol (VITAMIN D-3) 5000 units TABS Take 1 tablet by mouth daily Disp: 30 tablet Rfl: 11   FLUoxetine (PROZAC) 10 MG capsule 1 tab daily for 2 weeks then 2 tabs daily Disp: 60 capsule Rfl: 1   Multiple Vitamin (MULTIVITAMIN) TABS Take 1 tablet by mouth daily Disp:  Rfl:    MAGNESIUM CITRATE PO Take by mouth Disp:  Rfl:    Cyanocobalamin (B-12 PO) Take by mouth Disp:  Rfl:    hydrochlorothiazide (HYDRODIURIL) 25 MG tablet Take 1 tablet by mouth daily Disp: 30 tablet Rfl: 2     No current facility-administered medications for this visit.   Review of Patient's Allergies indicates:  No Known Allergies  Social History    Marital status: Single              Spouse name:                       Years of education:                 Number of children:               Social History Main Topics    Smoking status: Never Smoker  Smokeless tobacco: Never Used                        Alcohol use: No              Drug use: No              Sexual activity: Yes               Partners with: Female       Comment: H/O abnormal pap after her last C/S, S/P                 cauterization, normal since then.  No H/O                STDs.    Social History Narrative    Lives with the father of her third son - and son        2 other sons are in [place] - ages 3612, 8010 - live with her mom    Ex husband left her - is not supporting 2 sons - told her she was on her own.        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        Lives in [place] with husband and son.     2 kids in [place]--> came to [place] 2016 to visit.        06/24/15:    Lives with sons: 568, 3418, 38 yo and boyfriend. Feels safe at home    Works as Landhousecleaner      Past Surgical History:  No date: OB ANTEPARTUM CARE CESAREAN DLVR & POSTPARTUM      Comment: c/s x 3  No date: TUBAL LIGATION    Family History    Hypertension Mother     Diabetes  Father     Comment: died of diabetes, also cholesterol    Lipids Mother     Cancer - Other FamHxNeg     Diabetes Mother     Thyroid Maternal Aunt     Comment: multiple aunts/uncles    Stroke Maternal Grandmother     Comment: died of stroke    Heart Maternal Uncle 35    Comment: MI            Objective     All ROS reviewed and discussed and are otherwise negative unless listed above.       PHYSICAL EXAM:  BP 102/60  Pulse 81  Temp 98.9 F (37.2 C) (Oral)  Resp 14  Wt 74.4 kg (164 lb)  LMP 07/31/2016 (Exact Date)  SpO2 99%  BMI 32.03 kg/m2    GENERAL APPEARANCE - A&Ox3, WDWN, NAD  HEENT - head normocephalic, atraumatic, EOMI, PERRLA, conjunctiva clear bilaterally, no occular discharge.   Extrem - no tremor or edema.  SKIN - skin color, texture, temperature, and turgor are normal in the exposed areas without rash or concerning lesions    A/P:  1. GAD (generalized anxiety disorder)  Increase prozac to 30 mg, rto 1 mo. Ref: psychotherapy for personal and family counseling. Contact if new/worsening symptoms. Encouraged self care, utilizing support system, healthy diet, exercise, meditation, sleep, etc.   - REFERRAL TO ADULT PSYCHIATRY (INT)  - FLUoxetine (PROZAC) 20 MG capsule; Take 1 capsule by mouth daily  Dispense: 30 capsule; Refill: 2  - FLUoxetine (PROZAC) 10 MG capsule; Take 1 capsule by mouth daily To be taken with  20mg  tabs for total daily dose 30 mg  Dispense: 30 capsule; Refill: 2    2. Post traumatic stress disorder (PTSD)  SEE ABOVE  - REFERRAL TO ADULT PSYCHIATRY (INT)  - FLUoxetine (PROZAC) 20 MG capsule; Take 1 capsule by mouth daily  Dispense: 30 capsule; Refill: 2  - FLUoxetine (PROZAC) 10 MG capsule; Take 1 capsule by mouth daily To be taken with 20mg  tabs for total daily dose 30 mg  Dispense: 30 capsule; Refill: 2    3. Moderate episode of recurrent major depressive disorder (HCC)  SEE ABOVE  - REFERRAL TO ADULT PSYCHIATRY (INT)  - FLUoxetine (PROZAC) 20 MG capsule; Take 1 capsule by mouth daily   Dispense: 30 capsule; Refill: 2  - FLUoxetine (PROZAC) 10 MG capsule; Take 1 capsule by mouth daily To be taken with 20mg  tabs for total daily dose 30 mg  Dispense: 30 capsule; Refill: 2        I have reviewed the past medical, surgical, social and family history and updated these sections of EpicCare as relevant. All interim labs, test results, and consult notes were reviewed and discussed with patient. Medications were reconciled during this visit and a current medication list was given to the patient at the end of the visit.      follow up 1 mo    Janeal Holmes, PA-C

## 2016-08-03 NOTE — Progress Notes (Signed)
Pt feels safe at home.  Denyse Amassracey Breen, 08/03/2016, 3:12 PM

## 2016-08-04 ENCOUNTER — Other Ambulatory Visit (HOSPITAL_BASED_OUTPATIENT_CLINIC_OR_DEPARTMENT_OTHER): Payer: Self-pay

## 2016-08-04 NOTE — Progress Notes (Signed)
6/13/2018Thank you for your referral. We were unable to reach your patient today, a message was left to call [facility] at [phone] to schedule an appointment.        06/11/2018A second message was left for patient today to contact [facility] team at [phone] to schedule an appointment.      08/04/2016 - Thank you for your referral. We were unable to reach your patient today, a message was left to call [facility] at [phone] to schedule an appointment.

## 2016-11-03 MED FILL — VITAMIN D 50000UNT (ERGOCALCIFEROL): 28 days supply | Qty: 4 | Fill #0 | Status: CP

## 2016-11-14 MED FILL — FLUOXETINE   10MG: 30 days supply | Qty: 30 | Fill #1 | Status: CP

## 2016-11-14 MED FILL — FLUOXETINE   20MG: 30 days supply | Qty: 30 | Fill #1 | Status: CP

## 2017-03-13 MED FILL — *FLUOXETINE 10MG: 30 days supply | Qty: 30 | Fill #2 | Status: CP

## 2017-03-13 MED FILL — FLUOXETINE   20MG: 30 days supply | Qty: 30 | Fill #2 | Status: CP

## 2017-03-13 MED FILL — VITAMIN D 50000UNT (ERGOCALCIFEROL): 28 days supply | Qty: 4 | Fill #1 | Status: CP

## 2017-03-30 ENCOUNTER — Ambulatory Visit (HOSPITAL_BASED_OUTPATIENT_CLINIC_OR_DEPARTMENT_OTHER): Payer: Medicaid Other | Admitting: Obstetrics & Gynecology

## 2017-03-30 ENCOUNTER — Encounter (HOSPITAL_BASED_OUTPATIENT_CLINIC_OR_DEPARTMENT_OTHER): Payer: Self-pay | Admitting: Obstetrics & Gynecology

## 2017-03-30 VITALS — BP 95/68 | HR 79 | Ht 60.0 in | Wt 143.0 lb

## 2017-03-30 DIAGNOSIS — N946 Dysmenorrhea, unspecified: Secondary | ICD-10-CM

## 2017-03-30 NOTE — Progress Notes (Signed)
C.C: annual exam    HPI: used to see Dr. Raylene Miyamotohaudhuri in the past 2008.   With menses has a lot of cramps, depression and headaches regularly. Notes some spotting at the end of menses for up to 3 days, smells bad.     ROS - no neuro problems, no ENT problems, no cv problems, resp problems, no GI problems, no GU problems, no MSK problems, no psych problems, no hematologic problems, no skin/breast problems, no endocrine problems.    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 Lost 33 kg since surgery    FHx:  No breast/ovarian/uterine/cx/bowel ca.      SHx:  Smoking: none  Drugs: none  EtOH: none  Work: cleaning  Relationship: married x 12 years; safe    P/E:    General: Appears well. NAD  BP 95/68 (Site: LA, Position: Sitting, Cuff Size: Lrg)  Pulse 79  Ht 5' (1.524 m)  Wt 64.9 kg (143 lb)  LMP 03/29/2017  BMI 27.93 kg/m2  Pain Score: 0 (0/10)  H&N: normal neck and thyroid exam  CV: N HS, no murmurs  Resp: GAEB, no adv sounds  Abdo: Inspection: N. Palpation: soft, nontender. No HSM. N BS.  PV:on menses and declined pelvic exam today.     Imp: annual exam    Plan:   Pap due in 09/21.  Discussed breast health.   Interested in amenorrhea - discussed ablation as an option, hormones, hysterectomy. Interested in ablation. Reviewed procedure, recovery. Questions answered. Would  like to proceed. Aware that it may take a couple of months to  schedule this. Pt ok to wait.   Will return for a pelvic exam before surgery.     20/30 minutes spent counseling this patient

## 2017-04-06 LAB — HEMOGLOBIN A1C CARE EVERYWHERE: HEMOGLOBIN A1C CARE EVERYWHERE: 4.9 % — NL (ref 4.0–6.0)

## 2017-04-06 MED FILL — NYSTATIN POW 100000: 30 days supply | Qty: 60 | Fill #0 | Status: CP

## 2017-04-12 ENCOUNTER — Telehealth (HOSPITAL_BASED_OUTPATIENT_CLINIC_OR_DEPARTMENT_OTHER): Payer: Self-pay

## 2017-04-12 ENCOUNTER — Other Ambulatory Visit (HOSPITAL_BASED_OUTPATIENT_CLINIC_OR_DEPARTMENT_OTHER): Payer: Self-pay | Admitting: Physician Assistant

## 2017-04-12 DIAGNOSIS — F411 Generalized anxiety disorder: Secondary | ICD-10-CM

## 2017-04-12 DIAGNOSIS — F331 Major depressive disorder, recurrent, moderate: Secondary | ICD-10-CM

## 2017-04-12 DIAGNOSIS — F4310 Post-traumatic stress disorder, unspecified: Secondary | ICD-10-CM

## 2017-04-12 MED FILL — VITAMIN D3 5000UNIT: 30 days supply | Qty: 30 | Fill #1 | Status: CP

## 2017-04-12 NOTE — Progress Notes (Signed)
PER Pharmacy, Kimberly Nicelyatricia Lindsey is a 37 year old female has requested a refill of Fluoxetine 20 mg.      Last Office Visit: 08-03-2016 with Janeal HolmesMarshall, Christina E  Last Physical Exam: 10-15-2014      Other Med Adult:  Most Recent BP Reading(s)  03/30/17 : 95/68        Cholesterol (mg/dL)   Date Value   65/78/469609/16/2016 154     LOW DENSITY LIPOPROTEIN DIRECT (mg/dL)   Date Value   29/52/841309/16/2016 84     HIGH DENSITY LIPOPROTEIN (mg/dL)   Date Value   24/40/102709/16/2016 53     TRIGLYCERIDES (mg/dL)   Date Value   25/36/644009/16/2016 134         THYROID SCREEN TSH REFLEX FT4 (uIU/mL)   Date Value   10/16/2015 1.360         No results found for: TSH    HEMOGLOBIN A1C (%)   Date Value   10/09/2015 5.5       No results found for: POCA1C      No results found for: INR    SODIUM (mmol/L)   Date Value   07/06/2016 140       POTASSIUM (mmol/L)   Date Value   07/06/2016 4.2           CREATININE (mg/dL)   Date Value   34/74/259505/10/2016 0.7       Documented patient preferred pharmacies:      [facility] [place], [place] - [address]  Phone: [phone] Fax: [phone]

## 2017-04-12 NOTE — Progress Notes (Signed)
On behalf of Shonna ChockChristina Marshall, [place] writer called patient to follow up about depression. Update PHQ-9.  Patient requested refill of Fluoxetine (Prozac) but the last time she saw provider was in 07/2016. She was supposed to follow up in 07/18.    Patient was not reached. Left a message to call me back at [phone].

## 2017-04-13 ENCOUNTER — Telehealth (HOSPITAL_BASED_OUTPATIENT_CLINIC_OR_DEPARTMENT_OTHER): Payer: Self-pay

## 2017-04-13 NOTE — Progress Notes (Signed)
On behalf of Shonna ChockChristina Marshall, [place] writer called patient to follow up about depression. Update PHQ-9.  Patient requested refill of Fluoxetine (Prozac) but the last time she saw provider was in 07/2016. She was supposed to follow up in 07/18.    Patient was reached. PHQ-9 = 22; GAD-7 = 16.  Patient is not doing well. Not sleeping. A lot of concerns with her teenagers children that has been keeping her awake.   She has been taking Fluoxetine (Prozac) 30 mg. During the period from July/18 to now January/19 she used some Fluoxetine 30 mg from [place]. A relative sent her the medication.     Scheduled a follow up with Shonna Chockhristina Marshall for 04/18/17 at 11:40 am and also with me (Mental Health Care Partner) for same day at 11 am.

## 2017-04-17 NOTE — Progress Notes (Signed)
Review of Systems   Constitutional: Negative for chills, fever, malaise/fatigue and weight loss.   HENT: Negative for congestion, hearing loss and tinnitus.    Eyes: Negative for blurred vision, double vision and photophobia.   Respiratory: Negative for cough and shortness of breath.    Cardiovascular: Negative for chest pain, palpitations and leg swelling.   Gastrointestinal: Negative for abdominal pain, blood in stool, constipation, diarrhea, heartburn, melena, nausea and vomiting.   Genitourinary: Negative for dysuria, frequency, hematuria and urgency.   Musculoskeletal: Negative for joint pain and myalgias.   Skin: Negative for itching and rash.   Neurological: Negative for dizziness, tingling, tremors and headaches.   Psychiatric/Behavioral: Positive for depression. The patient is nervous/anxious.             Subjective     Kimberly Lindsey is a 37 year old female presents for f/u anxiety/depression which are uncontrolled. Pt historically on Prozac 30 mg, has not stopped. Was getting medication from [place] in interim, has not stopped or changed dose since last visit. PHQ9 = 22, no SI/HI.  Notes significant family stress due to worrying about her children. Pt admits racing thoughts, uncontrolled worrying, poor sleep.  H/o bariatric surgery, gaining weight 2/2 stress eating.   Most Recent Weight Reading(s)  04/18/17 : 64.9 kg (143 lb)  03/30/17 : 64.9 kg (143 lb)  08/03/16 : 74.4 kg (164 lb)  07/06/16 : 75.8 kg (167 lb)  11/10/15 : 93.4 kg (206 lb)    Not sleeping well. No panic attacks. Requesting sleep med. Seen by care partner today.         Patient Active Problem List:     Adjustment disorder with mixed anxiety and depressed mood     Obesity     Acute depression     Plantar fasciitis, bilateral     Post traumatic stress disorder (PTSD)     GAD (generalized anxiety disorder)     Neurocysticercosis     Tension headache      Current Outpatient Medications:  FLUoxetine (PROZAC) 20 MG capsule Take 1 capsule by  mouth daily Disp: 30 capsule Rfl: 2   FLUoxetine (PROZAC) 10 MG capsule Take 1 capsule by mouth daily To be taken with 20mg  tabs for total daily dose 30 mg Disp: 30 capsule Rfl: 2   Cholecalciferol (VITAMIN D-3) 5000 units TABS Take 1 tablet by mouth daily Disp: 30 tablet Rfl: 11   Multiple Vitamin (MULTIVITAMIN) TABS Take 1 tablet by mouth daily Disp:  Rfl:    MAGNESIUM CITRATE PO Take by mouth Disp:  Rfl:    Cyanocobalamin (B-12 PO) Take by mouth Disp:  Rfl:    hydrochlorothiazide (HYDRODIURIL) 25 MG tablet Take 1 tablet by mouth daily Disp: 30 tablet Rfl: 2     No current facility-administered medications for this visit.   Review of Patient's Allergies indicates:  No Known Allergies  Social History     Socioeconomic History    Marital status: Single     Spouse name: Not on file    Number of children: Not on file    Years of education: Not on file    Highest education level: Not on file   Social Needs    Financial resource strain: Not on file    Food insecurity - worry: Not on file    Food insecurity - inability: Not on file    Transportation needs - medical: Not on file    Transportation needs - non-medical: Not on file  Occupational History    Not on file   Tobacco Use    Smoking status: Never Smoker    Smokeless tobacco: Never Used   Substance and Sexual Activity    Alcohol use: No    Drug use: No    Sexual activity: Yes     Partners: Male     Comment: H/O abnormal pap after her last C/S, S/P cauterization, normal since then.  No H/O STDs.   Other Topics Concern    Not on file   Social History Narrative    Lives with the father of her third son - and son        2 other sons are in [place] - ages 82, 50 - live with her mom    Ex husband left her - is not supporting 2 sons - told her she was on her own.        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        Lives in [place] with husband and son.     2 kids in [place]--> came to [place] 2016 to visit.         06/24/15:    Lives with sons: 67, 65, 55 yo and boyfriend. Feels safe at home    Works as Land     Past Surgical History:  No date: OB ANTEPARTUM CARE CESAREAN DLVR & POSTPARTUM      Comment:  c/s x 3  No date: TUBAL LIGATION  Family History   Problem Relation Age of Onset    Hypertension Mother     Diabetes Father         died of diabetes, also cholesterol    Lipids Mother     Cancer - Other FamHxNeg     Diabetes Mother     Thyroid Maternal Aunt         multiple aunts/uncles    Stroke Maternal Grandmother         died of stroke    Heart Maternal Uncle 35        MI            Objective     All ROS reviewed and discussed and are otherwise negative unless listed above.       PHYSICAL EXAM:  BP 102/60  Pulse 59  Temp 99.1 F (37.3 C) (Oral)  Resp 16  Ht 5' 0.5" (1.537 m)  Wt 64.9 kg (143 lb)  LMP 03/29/2017  SpO2 100%  BMI 27.47 kg/m2      GENERAL APPEARANCE - A&Ox3, WDWN, NAD  HEENT - head normocephalic, atraumatic, EOMI, PERRLA, conjunctiva clear bilaterally, no occular discharge.   Extrem - no tremor or edema.   SKIN - skin color, texture, temperature, and turgor are normal in the exposed areas without rash or concerning lesions    A/P:  1. Acute depression  Uncontrolled, pt seems to have lack of coping mechanism/coping skills. Unclear if she is utilizing support system. Would benefit from CBT, therapy due to irrational/dramatic thoughts. Increase prozac to 40 mg, RTO 2 mo. If still uncontrolled would recommend psychopharm referral for consult re: med management. Trazodone for sleep prn, reviewed SE/risk/benefits. Not to take with other sedating medications.   - RFL TO ADULT OUTPT PSYCH (NEW) [facility]  - FLUoxetine (PROZAC) 40 MG capsule; Take 1 capsule by mouth daily  Dispense: 30 capsule; Refill: 5  - traZODone (DESYREL) 50 MG tablet; Take 1 tablet  by mouth nightly  Dispense: 30 tablet; Refill: 5    2. GAD (generalized anxiety disorder)  See above  - RFL TO ADULT OUTPT PSYCH (NEW) [facility]  - FLUoxetine  (PROZAC) 40 MG capsule; Take 1 capsule by mouth daily  Dispense: 30 capsule; Refill: 5  - traZODone (DESYREL) 50 MG tablet; Take 1 tablet by mouth nightly  Dispense: 30 tablet; Refill: 5    3. Post traumatic stress disorder (PTSD)  See above  - RFL TO ADULT OUTPT PSYCH (NEW) [facility]  - FLUoxetine (PROZAC) 40 MG capsule; Take 1 capsule by mouth daily  Dispense: 30 capsule; Refill: 5          I have reviewed the past medical, surgical, social and family history and updated these sections of EpicCare as relevant. All interim labs, test results, and consult notes were reviewed and discussed with patient. Medications were reconciled during this visit and a current medication list was given to the patient at the end of the visit.      follow up 2 mo    Janeal Holmes, PA-C    >25 min spent with patient and at least 50% of that time spent in counseling and coordination of care

## 2017-04-18 ENCOUNTER — Ambulatory Visit (HOSPITAL_BASED_OUTPATIENT_CLINIC_OR_DEPARTMENT_OTHER): Payer: Medicaid Other | Admitting: Physician Assistant

## 2017-04-18 ENCOUNTER — Encounter (HOSPITAL_BASED_OUTPATIENT_CLINIC_OR_DEPARTMENT_OTHER): Payer: Self-pay | Admitting: Physician Assistant

## 2017-04-18 ENCOUNTER — Ambulatory Visit (HOSPITAL_BASED_OUTPATIENT_CLINIC_OR_DEPARTMENT_OTHER): Payer: Medicaid Other

## 2017-04-18 VITALS — BP 102/60 | HR 59 | Temp 99.1°F | Resp 16 | Ht 60.5 in | Wt 143.0 lb

## 2017-04-18 DIAGNOSIS — F4310 Post-traumatic stress disorder, unspecified: Secondary | ICD-10-CM

## 2017-04-18 DIAGNOSIS — F32A Depression, unspecified: Secondary | ICD-10-CM

## 2017-04-18 DIAGNOSIS — F329 Major depressive disorder, single episode, unspecified: Principal | ICD-10-CM

## 2017-04-18 DIAGNOSIS — Z9884 Bariatric surgery status: Secondary | ICD-10-CM

## 2017-04-18 DIAGNOSIS — F411 Generalized anxiety disorder: Secondary | ICD-10-CM

## 2017-04-18 MED ORDER — FLUOXETINE HCL 40 MG PO CAPS
40.0000 mg | ORAL_CAPSULE | Freq: Every day | ORAL | 5 refills | Status: DC
Start: 2017-04-18 — End: 2017-05-09

## 2017-04-18 MED ORDER — FLUOXETINE HCL 40 MG PO CAPS: 40 mg | capsule | Freq: Every day | ORAL | 5 refills | 0 days | Status: DC

## 2017-04-18 MED ORDER — TRAZODONE HCL 50 MG PO TABS
50.0000 mg | ORAL_TABLET | Freq: Every evening | ORAL | 5 refills | Status: DC
Start: 2017-04-18 — End: 2017-05-09

## 2017-04-18 MED ORDER — TRAZODONE HCL 50 MG PO TABS: 50 mg | tablet | Freq: Every evening | ORAL | 5 refills | 0 days | Status: DC

## 2017-04-18 MED FILL — FLUOXETINE   40MG: 30 days supply | Qty: 30 | Fill #0 | Status: CP

## 2017-04-18 MED FILL — TRAZODONE  50MG: 30 days supply | Qty: 30 | Fill #0 | Status: CP

## 2017-04-18 NOTE — Progress Notes (Signed)
Pt feels safe at home.  Karna Dupesracey Ledora Delker, 04/18/2017, 12:04 PM
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
## 2017-04-19 DIAGNOSIS — Z9884 Bariatric surgery status: Secondary | ICD-10-CM | POA: Insufficient documentation

## 2017-04-27 ENCOUNTER — Other Ambulatory Visit (HOSPITAL_BASED_OUTPATIENT_CLINIC_OR_DEPARTMENT_OTHER): Payer: Self-pay | Admitting: Social Worker

## 2017-04-27 NOTE — Progress Notes (Signed)
04/26/2017 - A message was left for the patient, asking he/she to call us back at [phone] to schedule an appointment.    [place] therapist.

## 2017-04-28 ENCOUNTER — Other Ambulatory Visit (HOSPITAL_BASED_OUTPATIENT_CLINIC_OR_DEPARTMENT_OTHER): Payer: Self-pay | Admitting: Social Worker

## 2017-04-28 NOTE — Progress Notes (Signed)
Thank you for your referral. We tried to reach your patient 3 times for the past week and we haven’t been able  to reach them. Due to the volume of referrals, we are unable to make more than 3 outreach calls to patients and we will assume that you’ll encourage your patient to call [phone] if they are interested in pursuing treatment at some point in the future. Please know the referral is good for a year.      Cea/therapy.

## 2017-05-01 ENCOUNTER — Encounter (HOSPITAL_BASED_OUTPATIENT_CLINIC_OR_DEPARTMENT_OTHER): Payer: Self-pay | Admitting: Obstetrics & Gynecology

## 2017-05-01 ENCOUNTER — Telehealth (HOSPITAL_BASED_OUTPATIENT_CLINIC_OR_DEPARTMENT_OTHER): Payer: Self-pay

## 2017-05-01 ENCOUNTER — Ambulatory Visit (HOSPITAL_BASED_OUTPATIENT_CLINIC_OR_DEPARTMENT_OTHER)
Admit: 2017-05-01 | Discharge: 2017-05-01 | Disposition: A | Discharge status: Home / Self Care | Payer: Self-pay | Source: Ambulatory Visit | Attending: Obstetrics & Gynecology | Admitting: Obstetrics & Gynecology

## 2017-05-01 NOTE — Progress Notes (Signed)
Writer called patient to follow up with depression.    Patient was reached. Reports doing much better with higher dosage of medication Prozac 40 mg.   She is also taking trazodone (Desyrel). She is taking 25 mg instead of 50 mg as prescribed by doctor. Sleeping better. Feeling more calm.  Informed patient that [facility] has called her three times to book psychotherapy. [facility] and did not check her messages.  Provided her the [employer]'s phone number [phone] to schedule her appointment. Patient agreed.

## 2017-05-01 NOTE — Discharge Instructions (Signed)
INSTRUES DO PR-OPERATRIO PARA O SURGICAL DAY CARE   SURGICAL DAY CARE PRE-OPERATIVE INSTRUCTIONS    DIA DA CIRURGIA  DAY OF SURGERY    Chegar em: [facility] Registro em Quarta (Wednesday), Maro (March) 6 s (Time): 1145 am   Register first   Arrive at:  Registration on (Day of the Week, Month, Day, at (Time).        obrigatrio ter um adulto responsvel disponvel para acompanh-lo at [facility] [facility] casa aps a [place]. (Sugerimos que voc tenha algum disponvel para ajud-lo em casa aps a sua cirurgia).  You must have a responsible adult available to accompany you home after surgery. (We suggest that you have someone available to assist you at home after your surgery).    INSTRUES:   INSTRUCTIONS:   Voc no poder comer ou beber nada aps a meia-noite da noite anterior  cirurgia, nem mesmo gua, goma de mascar ou balas.   You may have nothing to eat or drink after midnight the night before your surgery, not even water, gum or hard candy.     Voc no poder fumar na manh do dia da sua cirurgia.  You may not smoke the morning of your surgery.     Por favor, deixe em casa todos os objetos de valor, incluindo joias, relgios, dinheiro, etc.  Please leave all valuables at home, including jewelry, watches, money, etc.     Remova qualquer esmalte das unhas antes de chegar ao Surgical Day Care e no use qualquer maquiagem ou batom.  Please remove all fingernail polish before arriving at Surgical Day Care and do not  wear any face or lip make-up.     No raspe os pelos da rea da cirurgia.  Do not shave surgical site.     No use lentes de contato.  Do not wear contact lenses.    MEDICAMENTOS:   MEDICATIONS:     Tome a medicao a seguir na noite anterior  cirurgia, na hora de dormir: usual   Take the following medication the night before surgery at bedtime:     [place] a [place] a seguir na manh do dia da sua cirurgia, com apenas um gole de gua: prozac with sip of water   Take the following  medication the morning of your surgery with only a sip of water:

## 2017-05-03 ENCOUNTER — Ambulatory Visit (HOSPITAL_BASED_OUTPATIENT_CLINIC_OR_DEPARTMENT_OTHER): Admit: 2017-05-03 | Payer: Self-pay | Source: Ambulatory Visit | Admitting: Obstetrics & Gynecology

## 2017-05-03 NOTE — Progress Notes (Signed)
Error/ Duplicate

## 2017-05-04 ENCOUNTER — Ambulatory Visit (HOSPITAL_BASED_OUTPATIENT_CLINIC_OR_DEPARTMENT_OTHER): Payer: Self-pay

## 2017-05-09 ENCOUNTER — Encounter (HOSPITAL_BASED_OUTPATIENT_CLINIC_OR_DEPARTMENT_OTHER): Payer: Self-pay | Admitting: Physician Assistant

## 2017-05-09 ENCOUNTER — Ambulatory Visit (HOSPITAL_BASED_OUTPATIENT_CLINIC_OR_DEPARTMENT_OTHER): Payer: Medicaid Other | Admitting: Physician Assistant

## 2017-05-09 ENCOUNTER — Ambulatory Visit (HOSPITAL_BASED_OUTPATIENT_CLINIC_OR_DEPARTMENT_OTHER): Payer: Medicaid Other

## 2017-05-09 ENCOUNTER — Other Ambulatory Visit (HOSPITAL_BASED_OUTPATIENT_CLINIC_OR_DEPARTMENT_OTHER): Payer: Self-pay | Admitting: Social Worker

## 2017-05-09 VITALS — BP 96/62 | HR 86 | Temp 98.6°F | Wt 144.0 lb

## 2017-05-09 DIAGNOSIS — F329 Major depressive disorder, single episode, unspecified: Principal | ICD-10-CM

## 2017-05-09 DIAGNOSIS — F32A Depression, unspecified: Secondary | ICD-10-CM

## 2017-05-09 DIAGNOSIS — F4310 Post-traumatic stress disorder, unspecified: Secondary | ICD-10-CM

## 2017-05-09 DIAGNOSIS — F411 Generalized anxiety disorder: Secondary | ICD-10-CM

## 2017-05-09 MED ORDER — FLUOXETINE HCL 40 MG PO CAPS: 40 mg | capsule | Freq: Every day | ORAL | 5 refills | 0 days | Status: AC

## 2017-05-09 MED ORDER — TRAZODONE HCL 50 MG PO TABS
50.0000 mg | ORAL_TABLET | Freq: Every evening | ORAL | 5 refills | Status: DC
Start: 2017-05-09 — End: 2018-01-31

## 2017-05-09 MED ORDER — FLUOXETINE HCL 40 MG PO CAPS
40.0000 mg | ORAL_CAPSULE | Freq: Every day | ORAL | 5 refills | Status: DC
Start: 2017-05-09 — End: 2018-01-31

## 2017-05-09 MED ORDER — TRAZODONE HCL 50 MG PO TABS: 50 mg | tablet | Freq: Every evening | ORAL | 5 refills | 0 days | Status: AC
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
## 2017-05-09 NOTE — Progress Notes (Signed)
05/09/17 - Thank you for your referral. We were able to contact your patient. She was given an appointment for 06/13/17 with [facility].
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
## 2017-05-09 NOTE — Patient Instructions (Signed)
Thank you for your referral. We tried to reach your patient 3 times for the past week and we haven’t been able  to reach them. Due to the volume of referrals, we are unable to make more than 3 outreach calls to patients and we will assume that you’ll encourage your patient to call [phone] if they are interested in pursuing treatment at some point in the future. Please know the referral is good for a year.      Cea/therapy.

## 2017-05-10 ENCOUNTER — Telehealth (HOSPITAL_BASED_OUTPATIENT_CLINIC_OR_DEPARTMENT_OTHER): Payer: Self-pay

## 2017-05-10 NOTE — Progress Notes (Signed)
Writer called patient to follow up. Check if patient was able to take her daughter to [facility] as recommended by nurse.     Patient was reached. Reports not able to take daughter yesterday but make plans to take her today around 5 pm. Daughter agreed with that.

## 2017-05-18 ENCOUNTER — Ambulatory Visit (HOSPITAL_BASED_OUTPATIENT_CLINIC_OR_DEPARTMENT_OTHER): Payer: Self-pay | Admitting: Obstetrics & Gynecology

## 2017-05-25 ENCOUNTER — Ambulatory Visit (HOSPITAL_BASED_OUTPATIENT_CLINIC_OR_DEPARTMENT_OTHER): Payer: Medicaid Other

## 2017-05-25 ENCOUNTER — Telehealth (HOSPITAL_BASED_OUTPATIENT_CLINIC_OR_DEPARTMENT_OTHER): Payer: Self-pay

## 2017-05-25 NOTE — Progress Notes (Signed)
Writer called patient to follow up about depression.   Patient missed an appointment with me today.    Patient was reached. Reports was unable to come.  She brought her daughter to psychiatry emergency services yesterday night. Her daughter has having suicidal thoughts.  Daughter was hospitalized.     Patient is feeling better that was able to connect her daughter with treatment. She understands that is the first step and daughter has a long way to treatment (depression + Substance use).     Patient is taking the medication Fluoxetine (Prozac) 40 mg and trazodone (Desyrel) 50 mg.   Reports the medication is helping her a lot.     Reminded psychotherapy appointment on 06/13/17 at 10:30 pm. She agreed.

## 2017-06-13 ENCOUNTER — Ambulatory Visit (HOSPITAL_BASED_OUTPATIENT_CLINIC_OR_DEPARTMENT_OTHER): Payer: Medicaid Other

## 2017-06-16 ENCOUNTER — Encounter (HOSPITAL_BASED_OUTPATIENT_CLINIC_OR_DEPARTMENT_OTHER): Payer: Self-pay

## 2017-06-16 NOTE — Progress Notes (Signed)
PSYCHIATRY TERMINATION AND TRANSFER NOTE    Termination Document    Date treatment started: 02/11/16    Transfer/termination date: 06/16/17    -last session was on 03/17/16    Reason for treatment: Pt reported a series of clinical symptoms, including: feeling anxious and on edge nearly every day; becoming easily irritated and upset; having difficulties with falling asleep due to worrying thoughts about her children, "unable to turn off thoughts"; difficulties with getting out of bed; hyperphysiological arousal (increased heart rate, shallow breathing, trembling hands); and passive thoughts about not being alive. Although pt endorsed passive SI, she denied a plan or intent.     Treatment course (response to medications, compliance): Pt attended a psychodiagnostic evaluation with this clinician and 2 follow up sessions.     Outstanding Issues: Through the course of therapy, pt's sleep related problems were alleviated as pt endorsed an improvement in her quality of sleep.      Safe to refill: n/a    Risk level: low    Plan: Pt may return to therapy as needed.    For transfers, the treatment plan will now be the responsibility of: n/a    (Clinician, please route/cc this encounter to your team/program administrative coordinator so that the treatment plan database can be updated.).    Providence LaniusVincenzo G. Keenan Trefry, Psy.D.

## 2017-06-20 MED FILL — VITAMIN D3 5000UNIT: 30 days supply | Qty: 30 | Fill #2 | Status: CP

## 2017-06-20 MED FILL — FLUOXETINE   40MG: 30 days supply | Qty: 30 | Fill #0 | Status: CP

## 2017-06-21 ENCOUNTER — Telehealth (HOSPITAL_BASED_OUTPATIENT_CLINIC_OR_DEPARTMENT_OTHER): Payer: Self-pay

## 2017-06-21 ENCOUNTER — Ambulatory Visit (HOSPITAL_BASED_OUTPATIENT_CLINIC_OR_DEPARTMENT_OTHER): Payer: Medicaid Other | Admitting: Physician Assistant

## 2017-06-21 NOTE — Progress Notes (Deleted)
Review of Systems   Constitutional: Negative for chills, fever, malaise/fatigue and weight loss.   HENT: Negative for congestion, hearing loss and tinnitus.    Eyes: Negative for blurred vision, double vision and photophobia.   Respiratory: Negative for cough and shortness of breath.    Cardiovascular: Negative for chest pain, palpitations and leg swelling.   Gastrointestinal: Negative for abdominal pain, blood in stool, constipation, diarrhea, heartburn, melena, nausea and vomiting.   Genitourinary: Negative for dysuria, frequency, hematuria and urgency.   Musculoskeletal: Negative for joint pain and myalgias.   Skin: Negative for itching and rash.   Neurological: Negative for dizziness, tingling, tremors and headaches.            Subjective     Kimberly Lindsey is a 37 year old female presents for f/u MDD, GAD  On prozac 40mg  daily, trazodone given last visit to help with sleep  PHQ9 =   GAD7 =       Patient Active Problem List:     Adjustment disorder with mixed anxiety and depressed mood     Obesity     Acute depression     Plantar fasciitis, bilateral     Post traumatic stress disorder (PTSD)     GAD (generalized anxiety disorder)     Neurocysticercosis     Tension headache     History of bariatric surgery      Current Outpatient Medications:  traZODone (DESYREL) 50 MG tablet Take 1 tablet by mouth nightly Disp: 30 tablet Rfl: 5   FLUoxetine (PROZAC) 40 MG capsule Take 1 capsule by mouth daily Disp: 30 capsule Rfl: 5   Cholecalciferol (VITAMIN D-3) 5000 units TABS Take 1 tablet by mouth daily Disp: 30 tablet Rfl: 11   Multiple Vitamin (MULTIVITAMIN) TABS Take 1 tablet by mouth daily Disp:  Rfl:    MAGNESIUM CITRATE PO Take by mouth Disp:  Rfl:    Cyanocobalamin (B-12 PO) Take by mouth Disp:  Rfl:      No current facility-administered medications for this visit.   Review of Patient's Allergies indicates:  No Known Allergies  Social History     Socioeconomic History    Marital status: Single     Spouse name:  Not on file    Number of children: Not on file    Years of education: Not on file    Highest education level: Not on file   Occupational History    Not on file   Social Needs    Financial resource strain: Not on file    Food insecurity:     Worry: Not on file     Inability: Not on file    Transportation needs:     Medical: Not on file     Non-medical: Not on file   Tobacco Use    Smoking status: Never Smoker    Smokeless tobacco: Never Used   Substance and Sexual Activity    Alcohol use: No    Drug use: No    Sexual activity: Yes     Partners: Male     Comment: H/O abnormal pap after her last C/S, S/P cauterization, normal since then.  No H/O STDs.   Lifestyle    Physical activity:     Days per week: Not on file     Minutes per session: Not on file    Stress: Not on file   Relationships    Social connections:     Talks on phone: Not on file  Gets together: Not on file     Attends religious service: Not on file     Active member of club or organization: Not on file     Attends meetings of clubs or organizations: Not on file     Relationship status: Not on file    Intimate partner violence:     Fear of current or ex partner: Not on file     Emotionally abused: Not on file     Physically abused: Not on file     Forced sexual activity: Not on file   Other Topics Concern    Not on file   Social History Narrative    Lives with the father of her third son - and son        2 other sons are in [place] - ages 11, 86 - live with her mom    Ex husband left her - is not supporting 2 sons - told her she was on her own.        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        Lives in [place] with husband and son.     2 kids in [place]--> came to [place] 2016 to visit.        06/24/15:    Lives with sons: 18, 47, 68 yo and boyfriend. Feels safe at home    Works as Land     Past Surgical History:  02/2016: GASTRIC BYPASS; N/A      Comment:  [facility]   No date: OB  ANTEPARTUM CARE CESAREAN DLVR & POSTPARTUM      Comment:  c/s x 3  No date: TUBAL LIGATION  Review of patient's family history indicates:  Problem: Hypertension      Relation: Mother          Age of Onset: (Not Specified)  Problem: Diabetes      Relation: Father          Age of Onset: (Not Specified)          Comment: died of diabetes, also cholesterol  Problem: Lipids      Relation: Mother          Age of Onset: (Not Specified)  Problem: Cancer - Other      Relation: FamHxNeg          Age of Onset: (Not Specified)  Problem: Diabetes      Relation: Mother          Age of Onset: (Not Specified)  Problem: Thyroid      Relation: Maternal Aunt          Age of Onset: (Not Specified)          Comment: multiple aunts/uncles  Problem: Stroke      Relation: Maternal Grandmother          Age of Onset: (Not Specified)          Comment: died of stroke  Problem: Heart      Relation: Maternal Uncle          Age of Onset: 41          Comment: MI           Objective     All ROS reviewed and discussed and are otherwise negative unless listed above.       PHYSICAL EXAM:    GENERAL APPEARANCE - A&Ox3, WDWN, NAD  HEENT - head normocephalic,  atraumatic, EOMI, PERRLA, conjunctiva clear bilaterally, no occular discharge. TM bilaterally gray and translucent, without bulging, erythema or exudate. Light reflex visible bilaterally. Nares patent. Nasal mucosa without erythema or visible mass. Pharynx without erythema, exudate or mass.    NECK - Neck soft, supple. No ant/posterior cervical or supraclavicular LAD .   LUNGS - Lungs CTATB without WRR. Normal inspiratory effort.  PF***  CARDIOVASCULAR -  RRR without S3, S4. No murmurs, clicks, gallops or rubs.  ABDOMEN - Abdomen soft, NTND. BS normal. No masses, no hepatosplenomegaly. Back - NTTP along spinous processes or paraspinal mm.  No CVA tenderness.  Extrem - no tremor or edema. DTRs + 2 and equal UE and LE bilat. Pulses normal and equal bilat at dorsalis pedis and posterior tibial  arteries  SKIN - skin color, texture, temperature, and turgor are normal in the exposed areas without rash or concerning lesions    A/p:    I have reviewed the past medical, surgical, social and family history and updated these sections of EpicCare as relevant. All interim labs, test results, and consult notes were reviewed and discussed with patient. Medications were reconciled during this visit and a current medication list was given to the patient at the end of the visit.      follow up ***    Janeal Holmeshristina E. Gunnar Hereford, PA-C

## 2017-06-21 NOTE — Progress Notes (Signed)
Writer called patient to follow up about depression.  She missed follow up with PCP today.    Patient was not reached. Left her a message to call me back at [phone].

## 2017-06-28 ENCOUNTER — Telehealth (HOSPITAL_BASED_OUTPATIENT_CLINIC_OR_DEPARTMENT_OTHER): Payer: Self-pay

## 2017-06-28 NOTE — Progress Notes (Signed)
Writer called patient to follow up about depression.  Patient missed appointment with PCP and therapist on 06/13/17 and 06/21/17.    Patient was not reached. Left her a message to call me back at [phone].

## 2017-07-05 ENCOUNTER — Telehealth (HOSPITAL_BASED_OUTPATIENT_CLINIC_OR_DEPARTMENT_OTHER): Payer: Self-pay

## 2017-07-05 NOTE — Progress Notes (Signed)
Writer called patient to follow up about depression.   She missed appointment with Shonna Chock, PA on 06/21/17.    Patient was not reached. Left a message to call me back at [phone].

## 2017-07-08 ENCOUNTER — Emergency Department (HOSPITAL_BASED_OUTPATIENT_CLINIC_OR_DEPARTMENT_OTHER)
Admission: RE | Admit: 2017-07-08 | Disposition: A | Discharge status: Home / Self Care | Payer: Self-pay | Source: Emergency Department | Attending: Emergency Medicine | Admitting: Emergency Medicine

## 2017-07-08 LAB — PROLACTIN: PROLACTIN: 10.4 ng/mL (ref 2.2–30.3)

## 2017-07-08 LAB — CBC WITH PLATELET
HEMATOCRIT: 33.9 % — ABNORMAL LOW (ref 34.1–44.9)
HEMOGLOBIN: 11.1 g/dL — ABNORMAL LOW (ref 11.2–15.7)
MEAN CORP HGB CONC: 32.7 g/dL (ref 31.0–37.0)
MEAN CORPUSCULAR HGB: 30.1 pg (ref 26.0–34.0)
MEAN CORPUSCULAR VOL: 91.9 fL (ref 80.0–100.0)
MEAN PLATELET VOLUME: 11.5 fL (ref 8.7–12.5)
PLATELET COUNT: 235 10*3/uL (ref 150–400)
RBC DISTRIBUTION WIDTH STD DEV: 38.9 fL (ref 35.1–46.3)
RBC DISTRIBUTION WIDTH: 11.9 % (ref 11.5–14.3)
RED BLOOD CELL COUNT: 3.69 M/uL — ABNORMAL LOW (ref 3.90–5.20)
WHITE BLOOD CELL COUNT: 5.7 10*3/uL (ref 4.0–11.0)

## 2017-07-08 LAB — BASIC METABOLIC PANEL
ANION GAP: 11 mmol/L (ref 5–15)
BUN (UREA NITROGEN): 8 mg/dL (ref 7–18)
CALCIUM: 9 mg/dL (ref 8.5–10.1)
CARBON DIOXIDE: 28 mmol/L (ref 21–32)
CHLORIDE: 103 mmol/L (ref 98–107)
CREATININE: 0.8 mg/dL (ref 0.4–1.2)
ESTIMATED GLOMERULAR FILT RATE: >60 mL/min (ref >60)
Glucose Random: 84 mg/dL (ref 74–160)
POTASSIUM: 4.5 mmol/L (ref 3.5–5.1)
SODIUM: 142 mmol/L (ref 136–145)

## 2017-07-08 LAB — NT-PROBNP: NT-proBNP: 82 pg/mL (ref 0–125)

## 2017-07-08 LAB — MAGNESIUM: MAGNESIUM: 2.2 mg/dL (ref 1.8–2.4)

## 2017-07-08 LAB — HCG QUALITATIVE SERUM: HCG QUALITATIVE SERUM: NEGATIVE

## 2017-07-08 LAB — TROPONIN I: TROPONIN I: <0.02 ng/mL (ref 0.00–0.04)

## 2017-07-08 LAB — HOLD BLUE TOP TUBE

## 2017-07-08 MED ORDER — METOCLOPRAMIDE HCL 5 MG/ML IJ SOLN
10.00 mg | Freq: Once | INTRAMUSCULAR | Status: AC
Start: 2017-07-08 — End: 2017-07-08
  Administered 2017-07-08: 10 mg via INTRAVENOUS
  Filled 2017-07-08: qty 2

## 2017-07-08 MED ORDER — SODIUM CHLORIDE 0.9 % IV BOLUS
1000.0000 mL | Freq: Once | INTRAVENOUS | Status: AC
Start: 2017-07-08 — End: 2017-07-08
  Administered 2017-07-08: 1000 mL via INTRAVENOUS

## 2017-07-08 MED ORDER — ACETAMINOPHEN 325 MG PO TABS
650.0000 mg | ORAL_TABLET | Freq: Once | ORAL | Status: AC
Start: 2017-07-08 — End: 2017-07-08
  Administered 2017-07-08: 650 mg via ORAL
  Filled 2017-07-08: qty 2

## 2017-07-08 MED ORDER — DIPHENHYDRAMINE HCL 50 MG/ML IJ SOLN
25.00 mg | Freq: Once | INTRAMUSCULAR | Status: AC
Start: 2017-07-08 — End: 2017-07-08
  Administered 2017-07-08: 25 mg via INTRAVENOUS
  Filled 2017-07-08: qty 1

## 2017-07-08 NOTE — ED Provider Notes (Signed)
eMERGENCY dEPARTMENT Resident NOTE    The ED nursing record was reviewed.   The prior medical records as available electronically through Epic were reviewed.  This patient was seen with Emergency Department attending physician Dr. Venetia Maxon    CHIEF COMPLAINT    Patient presents with:  Headache: HEADACHE/DIZZINESS  Fall      HPI    Kimberly Lindsey is a 37 year old female with a history of depression, obesity, anxiety, PTSD, bariatric surgery presenting with a syncopal episode.     Patient reports waking up this AM with posterior HA with photophobia, which she usually gets with when she is on her period. At around 10:45 AM, she went to the bathroom and remembers waking up on the floor. It was an unwitnessed fall. She denies SOB, CP, palpitation, lightheadedness, dizziness prior to the fall. She denies tongue biting, urinary/stool incontinence. She felt shakier after the fall.     When patient was around 70-years-old, she reports falling and shaking episodes. She does not think she had seizures but reports being on Tegretol for a few months. The medication was discontinued per patient as she no longer had more of those episodes.     She reports bilateral hand tremors for the last month that is unchanged. She feels dizzy with posterior HA currently (9 out of 10 in intensity). Denies CP/COP/palpitations/vision changes. She has a history of gastric bypass but has been PO'ing normally and does not have diarrhea/nausea/vomiting recently.       REVIEW OF SYSTEMS    The pertinent positives are reviewed in the HPI above. All other systems were reviewed and are negative.    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    PROBLEM LIST  Patient Active Problem List:     Adjustment disorder with mixed anxiety and depressed  mood     Obesity     Acute depression     Plantar fasciitis, bilateral     Post traumatic stress disorder (PTSD)     GAD (generalized anxiety disorder)     Neurocysticercosis     Tension headache     History of bariatric surgery      SURGICAL HISTORY    Past Surgical History:  02/2016: GASTRIC BYPASS; N/A      Comment:  [facility]   No date: OB ANTEPARTUM CARE CESAREAN DLVR & POSTPARTUM      Comment:  c/s x 3  No date: TUBAL LIGATION    CURRENT MEDICATIONS    No current facility-administered medications for this encounter.     Current Outpatient Medications:     traZODone (DESYREL) 50 MG tablet, Take 1 tablet by mouth nightly, Disp: 30 tablet, Rfl: 5    FLUoxetine (PROZAC) 40 MG capsule, Take 1 capsule by mouth daily, Disp: 30 capsule, Rfl: 5    Cholecalciferol (VITAMIN D-3) 5000 units TABS, Take 1 tablet by mouth daily, Disp: 30 tablet, Rfl: 11    Multiple Vitamin (MULTIVITAMIN) TABS, Take 1 tablet by mouth daily, Disp: , Rfl:     MAGNESIUM CITRATE PO, Take by mouth, Disp: , Rfl:     Cyanocobalamin (B-12 PO), Take by mouth, Disp: , Rfl:     ALLERGIES    Review of Patient's Allergies indicates:  No Known  Allergies    FAMILY HISTORY    Review of patient's family history indicates:  Problem: Hypertension      Relation: Mother          Age of Onset: (Not Specified)  Problem: Diabetes      Relation: Father          Age of Onset: (Not Specified)          Comment: died of diabetes, also cholesterol  Problem: Lipids      Relation: Mother          Age of Onset: (Not Specified)  Problem: Cancer - Other      Relation: FamHxNeg          Age of Onset: (Not Specified)  Problem: Diabetes      Relation: Mother          Age of Onset: (Not Specified)  Problem: Thyroid      Relation: Maternal Aunt          Age of Onset: (Not Specified)          Comment: multiple aunts/uncles  Problem: Stroke      Relation: Maternal Grandmother          Age of Onset: (Not Specified)          Comment: died of stroke  Problem: Heart       Relation: Maternal Uncle          Age of Onset: 1          Comment: MI      SOCIAL HISTORY    Social History     Socioeconomic History    Marital status: Single     Spouse name: Not on file    Number of children: Not on file    Years of education: Not on file    Highest education level: Not on file   Occupational History    Not on file   Social Needs    Financial resource strain: Not on file    Food insecurity:     Worry: Not on file     Inability: Not on file    Transportation needs:     Medical: Not on file     Non-medical: Not on file   Tobacco Use    Smoking status: Never Smoker    Smokeless tobacco: Never Used   Substance and Sexual Activity    Alcohol use: No    Drug use: No    Sexual activity: Yes     Partners: Male     Comment: H/O abnormal pap after her last C/S, S/P cauterization, normal since then.  No H/O STDs.   Lifestyle    Physical activity:     Days per week: Not on file     Minutes per session: Not on file    Stress: Not on file   Relationships    Social connections:     Talks on phone: Not on file     Gets together: Not on file     Attends religious service: Not on file     Active member of club or organization: Not on file     Attends meetings of clubs or organizations: Not on file     Relationship status: Not on file    Intimate partner violence:     Fear of current or ex partner: Not on file     Emotionally abused: Not on file     Physically abused: Not on  file     Forced sexual activity: Not on file   Other Topics Concern    Not on file   Social History Narrative    Lives with the father of her third son - and son        2 other sons are in [place] - ages 48, 72 - live with her mom    Ex husband left her - is not supporting 2 sons - told her she was on her own.        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        Lives in [place] with husband and son.     2 kids in [place]--> came to [place] 2016 to visit.        06/24/15:    Lives with sons:  75, 76, 76 yo and boyfriend. Feels safe at home    Works as Land         PHYSICAL EXAM      Vital Signs: BP 135/72  Pulse 69  Temp 98 F  Resp 18  LMP 07/08/2017  SpO2 100%    07/08/17  1844   BP: 135/72   Pulse: 69   Resp: 18   Temp: 98 F   SpO2: 100%       Constitutional: Well-developed, Well-nourished, Non-toxic appearance. Speaking full sentences.  HEENT: Sclera non-icteric, oropharynx clear without exudates, erythema. Mild swelling of R posterior head with mild TTP and without overlying erythema.   Neck: No C-Spine Tenderness; Normal range of motion, non-tender, Supple with no meningismus; no stridor.   Lymphatic: No lymphadenopathy noted.   Cardio.: RRR, No MRG's. Radial pulses 2+ B/L;  No LE edema  Pulmonary: Normal BS's bilat., No tachypnea, wheezes, rales or rhonchi;  Non-labored w/o retractions or accessory muscle use, or tripoding  Abd. + BS throughout. Soft, NTND  Musculoskeletal:  Moving all 4 extremities, limping a bit while ambulating. Mild TTP to R lower back.  Neurological: CNII-XII  intact, AOx3, No focal deficits noted. FNF, heel-shin without dysmetria. Sensation is intact in BLE/BUE, Motor strength is 5/5 BLE/BUE.   Psychiatric: Pleasant; Appropriate for age and situation.    RESULTS  Results for orders placed or performed during the hospital encounter of 07/08/17 (from the past 24 hour(s))   Basic Metabolic Panel    Collection Time: 07/08/17  7:21 PM   Result Value    SODIUM 142    POTASSIUM 4.5    CHLORIDE 103    CARBON DIOXIDE 28    ANION GAP 11    CALCIUM 9.0    Glucose Random 84    BUN (UREA NITROGEN) 8    CREATININE 0.8    ESTIMATED GLOMERULAR FILT RATE > 60    Narrative    Tests added: HCG QUAL by Quincy Sheehan. MD on  07/08/17 at 2124 by [email or web address].   Magnesium    Collection Time: 07/08/17  7:21 PM   Result Value    MAGNESIUM 2.2    Narrative    Tests added: HCG QUAL by Quincy Sheehan. MD on  07/08/17 at 2124 by [email or web address].   CBC with Platelet    Collection Time: 07/08/17  7:21  PM   Result Value    WHITE BLOOD CELL COUNT 5.7    RED BLOOD CELL COUNT 3.69 (L)    HEMOGLOBIN 11.1 (L)    HEMATOCRIT 33.9 (L)    MEAN CORPUSCULAR VOL 91.9  MEAN CORPUSCULAR HGB 30.1    MEAN CORP HGB CONC 32.7    RBC DISTRIBUTION WIDTH STD DEV 38.9    RBC DISTRIBUTION WIDTH 11.9    PLATELET COUNT 235    MEAN PLATELET VOLUME 11.5   Troponin I    Collection Time: 07/08/17  7:21 PM   Result Value    TROPONIN I < 0.02   NT-proBNP    Collection Time: 07/08/17  7:21 PM   Result Value    NT-proBNP 82    Narrative    Tests added: HCG QUAL by Quincy Sheehan. MD on  07/08/17 at 2124 by [email or web address].   Hold Blue Top Tube    Collection Time: 07/08/17  7:21 PM   Result Value    HOLD BLUE TOP TUBE RECEIVED IN HEMATOL   HCG Qualitative Serum    Collection Time: 07/08/17  7:21 PM   Result Value    HCG QUALITATIVE SERUM NEGATIVE    Narrative    Tests added: HCG QUAL by Lorenso Courier A. MD on  07/08/17 at 2124 by [email or web address].        RADIOLOGY  CT head without acute intracranial changes.     EKG:  Sinus bradycardia to 58, no ST or T-wave abnormalities.     PROCEDURES  None    MEDICATIONS ADMINISTERED ON THIS VISIT  Orders Placed This Encounter      sodium chloride 0.9 % IV bolus 1,000 mL      acetaminophen (TYLENOL) tablet 650 mg      metoclopramide (REGLAN) injection 10 mg      diphenhydrAMINE (BENADRYL) injection 25 mg      ED COURSE & MEDICAL DECISION MAKING      I reviewed the patient's past medical history/problem list, past surgical history, medication list, social history and allergies.    ED Decision Making & Course: Pt is a 37 year old female with a history of possible seizures, neurocysticercosis,  anxiety, s/p gastric bypass surgery presenting with a syncopal episode with a head strike. CBC with mild anemia (Hg 11.1), labs otherwise unremarkable. Hcg negative. CT head without acute changes. Given her history of likely seizures during her childhood controlled on Tegretol, it is possible that she had a seizure today. Spoke to  neurology who did not believe she needed AEDs at this point as her symptoms were well-controlled for many years without medications. Recommended follow up with neurology for further workup prior to starting AEDs. Discussed abstaining from driving/swimming prior to seeing neurology. Patient amenable. Received Tylenol X1 with improved HA. Pt remained hemodynamically stable during their stay in the emergency department.     Follow Up: PCP and neurology.

## 2017-07-08 NOTE — Narrator Note (Signed)
Pt reports having headache. MD aware

## 2017-07-08 NOTE — Narrator Note (Signed)
Pt resting comfortably

## 2017-07-08 NOTE — ED Triage Note (Signed)
Pt reports she fell this morning in the bathroom, unwitnessed, complaints of headache, denies vision changes, but likes light off. Denies vomiting. Provider at bedside.

## 2017-07-08 NOTE — Narrator Note (Signed)
Patient Disposition    Patient education for diagnosis, medications, activity, diet and follow-up.  Patient left ED 9:36 PM.  Patient rep received written instructions.  Interpreter to provide instructions: No    Patient belongings with patient: YES    Have all existing LDAs been addressed? Yes    Have all IV infusions been stopped? yes    Discharged to: Discharged to home

## 2017-07-08 NOTE — Discharge Instructions (Signed)
You were seen in the ED for an episode of passing out.  This could have been a seizure but we do not know for sure.    It is important to follow up with Neurology about today's episode.    Do NOT drive / swim / climb / use bath tubs until allowed by a Neurologist.    RETURN TO ER right away for pain, trouble breathing, trouble speaking, passing out again, confusion, or for any other concerns.

## 2017-07-08 NOTE — Narrator Note (Signed)
Report given to Wendy RN

## 2017-07-09 LAB — CT HEAD WO CONTRAST

## 2017-07-10 LAB — EKG

## 2017-07-10 LAB — HEMOGLOBIN A1C CARE EVERYWHERE: HEMOGLOBIN A1C CARE EVERYWHERE: 4.7 % — NL (ref 4.0–6.0)

## 2017-07-13 ENCOUNTER — Telehealth (HOSPITAL_BASED_OUTPATIENT_CLINIC_OR_DEPARTMENT_OTHER): Payer: Self-pay

## 2017-07-13 NOTE — Progress Notes (Signed)
Writer called patient to follow up about depression.   She missed appointment with Shonna Chock, PA on 07/18/17.     Also, offer to reschedule psychotherapy with Berdie Ogren for 07/14/17.     Patient was not reached. Left a message to call me back at [phone].

## 2017-07-17 MED FILL — VITAMIN D 50000UNT (ERGOCALCIFEROL): 28 days supply | Qty: 4 | Fill #0 | Status: CP

## 2017-07-18 ENCOUNTER — Encounter (HOSPITAL_BASED_OUTPATIENT_CLINIC_OR_DEPARTMENT_OTHER): Payer: Self-pay | Admitting: Physician Assistant

## 2017-07-18 ENCOUNTER — Ambulatory Visit (HOSPITAL_BASED_OUTPATIENT_CLINIC_OR_DEPARTMENT_OTHER): Payer: Medicaid Other | Admitting: Physician Assistant

## 2017-07-18 VITALS — BP 98/56 | HR 96 | Temp 99.1°F | Resp 14 | Ht 61.0 in | Wt 137.0 lb

## 2017-07-18 DIAGNOSIS — E162 Hypoglycemia, unspecified: Secondary | ICD-10-CM

## 2017-07-18 DIAGNOSIS — Z8669 Personal history of other diseases of the nervous system and sense organs: Secondary | ICD-10-CM

## 2017-07-18 DIAGNOSIS — R4020 Unspecified coma: Secondary | ICD-10-CM

## 2017-07-18 MED FILL — FLUOXETINE   40MG: 30 days supply | Qty: 30 | Fill #1 | Status: CP

## 2017-07-18 MED FILL — TRAZODONE  50MG: 30 days supply | Qty: 30 | Fill #0 | Status: CP

## 2017-07-18 NOTE — Progress Notes (Signed)
Review of Systems   Constitutional: Negative for chills, fever, malaise/fatigue and weight loss.   HENT: Negative for congestion, hearing loss and tinnitus.    Eyes: Negative for blurred vision, double vision and photophobia.   Respiratory: Negative for cough and shortness of breath.    Cardiovascular: Negative for chest pain, palpitations and leg swelling.   Gastrointestinal: Negative for abdominal pain, blood in stool, constipation, diarrhea, heartburn, melena, nausea and vomiting.   Genitourinary: Negative for dysuria, frequency, hematuria and urgency.   Musculoskeletal: Negative for joint pain and myalgias.   Skin: Negative for itching and rash.   Neurological: Positive for dizziness, loss of consciousness and headaches. Negative for tingling and tremors.            Subjective     Kimberly Lindsey is a 37 year old female presents for f/u ED visit [facility] on 5/11 s/p fall and LOC (unknown time, not more than ) while at church that AM, woke with confusion. H/o similar episodes as a child, treated with tegretol but has not taken in "many years". States this episode felt different b/c no prodrome, whereas she used to have prodrome prior to pediatric events.   In ED lab w/u normal (prolactin, hcg, BNP, mg, BMP) except CBC which showed very mild anemia with normal MCV, likely 2/2  h/o bariatric surgery. CT stable from previous, scattered calcifications 2/2 h/o cysticercosis.   Most Recent Weight Reading(s)  07/18/17 : 62.1 kg (137 lb)  05/09/17 : 65.3 kg (144 lb)  05/01/17 : 64.9 kg (143 lb)  04/18/17 : 64.9 kg (143 lb)  03/30/17 : 64.9 kg (143 lb)    Pt states before going to bathroom had "very mild normal" HA, felt fine. Was menstruating, states "when I'm on my period something weird always happens" - usually HA which resolves with OTC medications. When she went to bathroom, was stretching and that's the last thing she remembers. No prodrome. States when she woke up didn't know where she was, felt disoriented  with severe posterior HA (thinks from hitting her head). States she fell from standing after shutting bathroom door. That morning had coffee with milk and 2 slices of bread and banana.  Water intake 2 cups that day. No etoh day prior. Thinks may not have had much water day prior, had 1 cup coffee, limited salt. No new medications or recent dose changes.     States with menses lately has been getting dizziness and strong period HA, starts 2- 3 days prior to menses and resolves as bleeding stops with nighttime hotflashes and sweats.  Not on birth control. Periods still regular, irregular flow.     No palpitations, SOB, dizziness unrelated to fall except with menses or occasional hypoglycemia symptoms about 1-2 times weekly.   HEMOGLOBIN A1C (%)   Date Value   07/18/2017 4.6   10/09/2015 5.5   06/24/2015 5.8 (H)       Patient Active Problem List:     Adjustment disorder with mixed anxiety and depressed mood     Obesity     Acute depression     Plantar fasciitis, bilateral     Post traumatic stress disorder (PTSD)     GAD (generalized anxiety disorder)     Neurocysticercosis     Tension headache     History of bariatric surgery      Current Outpatient Medications:  traZODone (DESYREL) 50 MG tablet Take 1 tablet by mouth nightly Disp: 30 tablet Rfl: 5   FLUoxetine (  PROZAC) 40 MG capsule Take 1 capsule by mouth daily Disp: 30 capsule Rfl: 5   Cholecalciferol (VITAMIN D-3) 5000 units TABS Take 1 tablet by mouth daily Disp: 30 tablet Rfl: 11   Multiple Vitamin (MULTIVITAMIN) TABS Take 1 tablet by mouth daily Disp:  Rfl:    MAGNESIUM CITRATE PO Take by mouth Disp:  Rfl:    Cyanocobalamin (B-12 PO) Take by mouth Disp:  Rfl:      No current facility-administered medications for this visit.   Review of Patient's Allergies indicates:  No Known Allergies  Social History     Socioeconomic History    Marital status: Single     Spouse name: Not on file    Number of children: Not on file    Years of education: Not on file     Highest education level: Not on file   Occupational History    Not on file   Social Needs    Financial resource strain: Not on file    Food insecurity:     Worry: Not on file     Inability: Not on file    Transportation needs:     Medical: Not on file     Non-medical: Not on file   Tobacco Use    Smoking status: Never Smoker    Smokeless tobacco: Never Used   Substance and Sexual Activity    Alcohol use: No    Drug use: No    Sexual activity: Yes     Partners: Male     Comment: H/O abnormal pap after her last C/S, S/P cauterization, normal since then.  No H/O STDs.   Lifestyle    Physical activity:     Days per week: Not on file     Minutes per session: Not on file    Stress: Not on file   Relationships    Social connections:     Talks on phone: Not on file     Gets together: Not on file     Attends religious service: Not on file     Active member of club or organization: Not on file     Attends meetings of clubs or organizations: Not on file     Relationship status: Not on file    Intimate partner violence:     Fear of current or ex partner: Not on file     Emotionally abused: Not on file     Physically abused: Not on file     Forced sexual activity: Not on file   Other Topics Concern    Not on file   Social History Narrative    Lives with the father of her third son - and son        2 other sons are in [place] - ages 66, 28 - live with her mom    Ex husband left her - is not supporting 2 sons - told her she was on her own.        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        Lives in [place] with husband and son.     2 kids in [place]--> came to [place] 2016 to visit.        06/24/15:    Lives with sons: 72, 94, 41 yo and boyfriend. Feels safe at home    Works as Land     Past Surgical History:  02/2016: GASTRIC BYPASS; N/A  Comment:  [facility]   No date: OB ANTEPARTUM CARE CESAREAN DLVR & POSTPARTUM      Comment:  c/s x 3  No date: TUBAL  LIGATION  Review of patient's family history indicates:  Problem: Hypertension      Relation: Mother          Age of Onset: (Not Specified)  Problem: Diabetes      Relation: Father          Age of Onset: (Not Specified)          Comment: died of diabetes, also cholesterol  Problem: Lipids      Relation: Mother          Age of Onset: (Not Specified)  Problem: Cancer - Other      Relation: FamHxNeg          Age of Onset: (Not Specified)  Problem: Diabetes      Relation: Mother          Age of Onset: (Not Specified)  Problem: Thyroid      Relation: Maternal Aunt          Age of Onset: (Not Specified)          Comment: multiple aunts/uncles  Problem: Stroke      Relation: Maternal Grandmother          Age of Onset: (Not Specified)          Comment: died of stroke  Problem: Heart      Relation: Maternal Uncle          Age of Onset: 2          Comment: MI           Objective     All ROS reviewed and discussed and are otherwise negative unless listed above.       PHYSICAL EXAM:  Repeat Blood Pressure:  BP Pulse Site Cuff Size Time Date   98/56 96 --- --- 12:22 PM 07/18/2017     Orthostatic Vitals  BP Pulse Position Site Cuff Size Time Date   106/60 82 Supine --- --- 12:32 PM 07/18/2017   102/58 96 Sitting --- --- 12:33 PM 07/18/2017   88/66 115 Standing --- --- 12:34 PM 07/18/2017     Peak Flow Information  Peak Flow Resp Time Date   --- 14 12:22 PM 07/18/2017     Pain Information  Score Location Time Date   0 (0/10) --- 12:22 PM 07/18/2017       GENERAL APPEARANCE - A&Ox3, WDWN, NAD  HEENT - head normocephalic, atraumatic, EOMI, no nystagmus,PERRLA, conjunctiva clear bilaterally, no occular discharge. TM bilaterally gray and translucent, without bulging, erythema or exudate. Light reflex visible bilaterally. Nares patent. Nasal mucosa without erythema or visible mass. Pharynx without erythema, exudate or mass.    NECK - Neck soft, supple. No ant/posterior cervical or supraclavicular LAD .   LUNGS - Lungs CTATB without WRR.  Normal inspiratory effort.   CARDIOVASCULAR -  RRR without S3, S4. No murmurs, clicks, gallops or rubs.  Extrem - no tremor or edema. DTRs + 2 and equal UE and LE bilat. Pulses normal and equal bilat at dorsalis pedis and posterior tibial arteries  SKIN - skin color, texture, temperature, and turgor are normal in the exposed areas without rash or concerning lesions    A/P:  1. LOC (loss of consciousness) (HCC)  Unclear etiology, sounds vasovagal but unclear as to why. Given h/o similar childhood events and treatment with  anti-seizure medication will get neuro input. Consider Holter monitor if no neuro etiology suggested, although pt not symptomatic for cardiac etiology. Pt ed reviewed - encouraged hydration, avoid salt/caffeine/etoh. Multiple small meals q 3 hr,f/u if new/worsening symptoms or issues/conerns.   - REFERRAL TO NEUROLOGY ( INT)    2. Hypoglycemia  A1c down to 4.6% which is great but could indicate that pt is having reactive hypoglycemia, could have contributed to above. Recommend low glycemic index diet without restriction: attn: protein, fiber, low glycemic index carbs to avoid lows. Do not go more than 3 hrs without eating something small.   - HEMOGLOBIN A1C    3. History of seizures as a child  See above, will get neuro input      I have reviewed the past medical, surgical, social and family history and updated these sections of Epic[employer] as relevant. All interim labs, test results, and consult notes were reviewed and discussed with patient. Medications were reconciled during this visit and a current medication list was given to the patient at the end of the visit.      follow up as above, prn with PCP    Janeal Holmes, PA-C

## 2017-07-18 NOTE — Progress Notes (Signed)
Pt feels safe at home.  [name], 07/18/2017

## 2017-07-18 NOTE — Patient Instructions (Addendum)
A referral to a [facility] specialist was placed for you today.    To schedule your appointment with the specialist, please call the appropriate number below.    Medical Specialties: [phone] to schedule at [place] for:  Cardiology, Endocrinology, Gastroenterology  Infectious Disease, Neurology, Nephrology, Pulmonary  Hematology/Oncology, Dermatology    Surgical Specialities: [phone] to schedule at [employer]/[place] for:  General Surgery, Vascular Surgery, Plastic Surgery, Thoracic Surgery,   Allergy, Ear, Nose, and Throat  Podiatry, Urology    Orthopedics: [phone] to schedule at [place] for:  Orthopedic Surgery, Physiatry. Rheumatology    [facility] --- [phone]  [place] --- [phone]    Radiology: [phone] to schedule at [place] for:  Mammogram, CT Scan, MRI, Ultrasound, Bone Density, X-ray    Pediatric Specialties: [phone]  General Surgery, Dermatology, Pulmonary, Neurology, Endocrinology    Psychiatry: [phone]  Intake/scheduling         If you need help or have problems making an appointment, please call our clinic and ask to speak with referral coordinator #[phone]        Patient Education      Index Relatedtopics   Glycemic Index Diet     ________________________________________________________________________  KEY POINTS   The glycemic index (GI) ranks foods that contain carbohydrates based on how the food affects blood glucose. A food with a high GI raises blood glucose more than a food with a medium or low GI. You need sugar in your cells for energy, but too much sugar in your blood is not good for your health.   Many things can affect the GI of a food. Its best to work with a dietitian if you want to try this kind of diet.  ________________________________________________________________________  What is the glycemic index?  Carbohydrates, or carbs, are  the main source of energy for the body. Carbs get digested quickly, and the body converts them easily into blood glucose. Your body uses insulin to help move sugar from the blood into the cells. You need sugar in your cells for energy, but too much sugar in your blood is not good for your health. If your blood glucose runs too high over time (months or years), it can cause problems with your heart, eyes, kidneys, nerves, and blood vessels.   Carbohydrates are the foods that affect blood glucose the most. Examples of carbs are fruits, starchy vegetables, breads, pastas, rice, sugar, syrup, and honey. The glycemic index (GI) ranks foods that contain carbohydrates based on how the food affects blood glucose. A food with a high GI raises blood glucose more than a food with a medium or low GI.  The glycemic index measures blood glucose effect per gram of carbohydrate in a food, not per gram of the food. This can be confusing, because a large serving of a low GI food will raise blood glucose.  Examples of foods with a low GI include most beans and lentils, vegetables such as broccoli, lettuce, and peppers, some starchy vegetables like sweet potatoes, most fruit, and many whole grain breads and cereals. Meats and fats dont contain carbohydrates, so they don't have a GI.  What affects the GI of a food?  Many things can affect the GI of a food:    The GI of a food is different when eaten alone than when you eat it with other foods. For example, eating protein with a high GI food slows down the rate of digestion and can cut the GI effect by  about one third. Combining high GI foods with low GI foods helps balance out the effect on your blood glucose.   The size of food particles affects GI. Fruit juice has a higher GI than whole fruit. Mashed potatoes have a higher GI than a baked potato.   Whether you cook foods, and how long you cook them affects the GI. For example, cooking pasta until it is soft raises the GI, while  lightly cooked or al dente pasta has a lower GI.   The form of the food makes a difference. For example, long-grain white rice has a lower GI than brown rice. However, brown rice has a lower GI than short-grain white rice.   Foods high in fiber take longer to digest and therefore affect your blood glucose more slowly. For example, All-Bran cereal is a low GI food, while Puffed Wheat is a high GI food.   The higher the fat content of a food the lower its GI. Fat slows digestion, so foods such as Snickers candy bars affect your blood glucose less quickly than food such as rice cakes.   When a fruit or vegetable is ripe, it has a higher GI than when it is slightly green.   Acids in foods slow down digestion. Having meals that include some vinegar, lemon or lime juice, or pickled vegetables can lower the GI for the meal.  Following a glycemic index diet may have health benefits for people with diabetes, and possibly acne and heart disease. It has not been shown to help with weight loss. It is also important to look at the nutrient value of foods, not just the GI, when making food choices. Ask your healthcare provider for a referral to a dietitian to learn more about whether the GI diet is right for you.  Developed by [employer].  Adult Advisor 2017.1 published by [employer].  Last modified: 2014-04-15  Last reviewed: 2013-11-21  This content is reviewed periodically and is subject to change as new health information becomes available. The information is intended to inform and educate and is not a replacement for medical evaluation, advice, diagnosis or treatment by a healthcare professional.  References   Adult Advisor 2017.1 Index    Copyright  2017 [employer], a division of [employer]. All rights reserved.

## 2017-07-19 LAB — HEMOGLOBIN A1C
ESTIMATED AVERAGE GLUCOSE: 85 (ref 74–160)
HEMOGLOBIN A1C: 4.6 % (ref 4.0–5.6)

## 2017-07-20 ENCOUNTER — Encounter (HOSPITAL_BASED_OUTPATIENT_CLINIC_OR_DEPARTMENT_OTHER): Payer: Self-pay

## 2017-07-20 ENCOUNTER — Encounter (HOSPITAL_BASED_OUTPATIENT_CLINIC_OR_DEPARTMENT_OTHER): Payer: Self-pay | Admitting: Physician Assistant

## 2017-07-20 NOTE — Progress Notes (Signed)
Letter printed and mailed to patient.    Daniel Nones, 07/20/2017

## 2017-07-31 ENCOUNTER — Ambulatory Visit (HOSPITAL_BASED_OUTPATIENT_CLINIC_OR_DEPARTMENT_OTHER): Payer: Medicaid Other

## 2017-08-01 ENCOUNTER — Other Ambulatory Visit (HOSPITAL_BASED_OUTPATIENT_CLINIC_OR_DEPARTMENT_OTHER): Payer: Self-pay

## 2017-08-01 ENCOUNTER — Ambulatory Visit (HOSPITAL_BASED_OUTPATIENT_CLINIC_OR_DEPARTMENT_OTHER): Payer: Medicaid Other

## 2017-08-01 DIAGNOSIS — F339 Major depressive disorder, recurrent, unspecified: Secondary | ICD-10-CM
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
## 2017-08-01 NOTE — Progress Notes (Signed)
Thank you for your recent referral. We were able to connect with your patient, and they were given an appointment for 09/18/2017 with [facility]

## 2017-08-11 DIAGNOSIS — Z8669 Personal history of other diseases of the nervous system and sense organs: Secondary | ICD-10-CM | POA: Insufficient documentation

## 2017-08-11 DIAGNOSIS — R4020 Unspecified coma: Secondary | ICD-10-CM | POA: Insufficient documentation

## 2017-08-22 ENCOUNTER — Ambulatory Visit (HOSPITAL_BASED_OUTPATIENT_CLINIC_OR_DEPARTMENT_OTHER): Payer: Self-pay

## 2017-09-06 MED FILL — TRAZODONE  50MG: 30 days supply | Qty: 30 | Fill #1 | Status: CP

## 2017-09-06 MED FILL — *VITAMIN D 50000UNT: 28 days supply | Qty: 4 | Fill #0 | Status: CP

## 2017-09-06 MED FILL — FLUOXETINE 40MG: 30 days supply | Qty: 30 | Fill #2 | Status: CP

## 2017-09-18 ENCOUNTER — Telehealth (HOSPITAL_BASED_OUTPATIENT_CLINIC_OR_DEPARTMENT_OTHER): Payer: Self-pay

## 2017-09-18 NOTE — Progress Notes (Signed)
Writer called patient to to follow up about depression.   Update PHQ-9. Check if taking Fluoxetine. Offer to reschedule psychotherapy.    Patient was not reached. Left a message to call me back at [phone].

## 2017-09-19 ENCOUNTER — Ambulatory Visit: Payer: No Typology Code available for payment source | Attending: Internal Medicine | Admitting: Neurology

## 2017-09-19 ENCOUNTER — Encounter (HOSPITAL_BASED_OUTPATIENT_CLINIC_OR_DEPARTMENT_OTHER): Payer: Self-pay | Admitting: Neurology

## 2017-09-19 VITALS — BP 102/60 | HR 75 | Temp 98.5°F | Wt 141.0 lb

## 2017-09-19 DIAGNOSIS — R55 Syncope and collapse: Secondary | ICD-10-CM | POA: Diagnosis present

## 2017-09-19 NOTE — Progress Notes (Signed)
Patient feels physically safe at home.

## 2017-09-19 NOTE — Progress Notes (Signed)
Neurology Consultation  Kimberly Lindsey is sent for consultation for evaluation of loss of consciousness.    A [place] telephone interpretor was used for the encounter today.     History of Present Illness  Kimberly Lindsey is a 37 year old woman who reports that she had an episode of loss of consciousness on 07/08/17.    On review of the patient's chart, she was seen in [facility] on 07/08/17 at that time reporting a headache while at church, then losing consciousness while standing in the bathroom at church.  She got up slightly confused and exited the bathroom but promptly reoriented when she saw her husband and the pastor.  She was apparently only in the bathroom for 5-10 minutes.  There was no tongue biting or incontinence.  She also reported she had episodes in childhood of "turning off" and dropping, treated with Tegretol, but was off of this for many years.      She reports that prior to losing consciousness she was feeling her head was heavy, she felt that her head was heavy, then she felt a spinning sensation and passed out.  She was unconscious for about 8-10 minutes and then reoriented herself promptly after 1-2 minutes.  She did not have any incontinence or tongue biting. Unknown if there was any jerking. She had eaten well that day and was not feeling sick that day.  Of note she was on her period at the time of this event and had similar symptoms that she has during her period, headache, dizziness, and head heaviness.  During her period she feels dizzy when standing to the point that she is concerned about leaving the house.  At baseline she drinks less than half a liter of water per day.    Apparently she had some episodes of fainting around age 37.  She may have been put on Tegretol in the past but believes this was used for nervousness.  She stopped taking this around age 37.  She did not have any episodes of fainting since age 37.  Her birth and development were normal.  She  completed school through age 37, dropping out due to financial reasons. She was in normal classes.  She has never had head trauma resulting in loss of consciousness.    Past Medical History  Patient Active Problem List:     Adjustment disorder with mixed anxiety and depressed mood     Obesity     Acute depression     Plantar fasciitis, bilateral     Post traumatic stress disorder (PTSD)     GAD (generalized anxiety disorder)     Neurocysticercosis     Tension headache     History of bariatric surgery     LOC (loss of consciousness) (HCC)     History of seizures as a child      Current Medications  Current Outpatient Medications   Medication Sig    traZODone (DESYREL) 50 MG tablet Take 1 tablet by mouth nightly    FLUoxetine (PROZAC) 40 MG capsule Take 1 capsule by mouth daily    Cholecalciferol (VITAMIN D-3) 5000 units TABS Take 1 tablet by mouth daily    Multiple Vitamin (MULTIVITAMIN) TABS Take 1 tablet by mouth daily    MAGNESIUM CITRATE PO Take by mouth    Cyanocobalamin (B-12 PO) Take by mouth     No current facility-administered medications for this visit.        Allergies  Review of Patient's  Allergies indicates:  No Known Allergies    Family History  No family history of any neurological diseases.    Social History  She does housecleaning.  She lives in [place] with her kids.  She has never smoked. She does not drink alcohol or use drugs.    Review of Systems  All other systems were reviewed and were negative.    Physical Exam  BP 102/60 (Cuff Size: Reg)  Pulse 75  Temp 98.5 F (36.9 C)  Wt 64 kg (141 lb)  SpO2 99%  BMI 26.64 kg/m2   Ms. Kewley appears her stated age and is in no acute distress.   Extremities are warm and well-perfused. No edema.    Mental status: Awake and alert. Language is fluent, responses appropriate.  Recounts elements of the history well.    Cranial Nerves:  Pupils are round and equally reactive to light bilaterally. Visual fields are full to confrontation.  Extraocular  movements are intact and full with normal smooth pursuit. No nystagmus.  No ptosis. Facial sensation is intact to light touch.  Face is symmetric.  Hearing is intact to finger rub bilaterally.  No dysarthria.  Palate elevates symmetrically. Tongue protrudes to the midline.  Shoulder shrug is symmetric and 5/5 strength bilaterally.    Motor: Normal bulk. No pronator drift.. Rapid sequential movements in the arms and legs are normal. Strength is full throughout the arms and legs.    Reflexes: 2+ at the biceps, triceps, and brachioradialis bilaterally.  2+ at the knees and ankles bilaterally.      Sensory: Light touch sensation intact in all extremities.      Coordination:  Finger-to-nose was normal and symmetric bilaterally without ataxia or dysmetria.    Gait: Able to stand from the chair easily.  Gait is narrow-based and steady.     Labs and Imaging Personally Reviewed  (07/08/17) Head CT without contrast:  No acute intracranial process. Scattered cerebral calcifications, likely sequela of prior cysticercosis.     (10/27/15) CTA Head and Neck:  Normal.    Assessment and Plan  Kimberly Lindsey is a 37 year old woman who presents with an episode of loss of consciousness on 07/08/17 while standing in the bathroom preceded by dizziness and head heaviness, unconsciousness for approximately 5-10 minutes, returning promptly back to baseline in 1-2 minutes.  Her neurological examination was normal.  Her Head CT without contrast revealed scattered calcifications consistent with prior cysticercosis.  We discussed that given the preceding dizziness and head heaviness, brief duration of episode without post-ictal confusion, incontinence, or tongue biting, and in lieu of her baseline low BP, episode occurring during her menstrual period, and decreased fluid intake at baseline, that this episode seems most likely syncopal in nature. She reports similar frequent presyncopal symptoms during her period as well.  She has had a brief  period of fainting spells in childhood but was free of spells since age 37.  I have recommended she increase her fluids to a goal of 2-3 liters per day and increase her salt intake as well.  Should she have a recurrent episode which is more concerning for a seizure I recommended she follow up with me to consider workup with an EEG and/or MRI Brain but we will defer these for now since they are unlikely to change manangement.    It was a pleasure to meet Ms. Chales Abrahams.  I have suggested she follows up with me anytime in the future as needed.    Greater  than 50% of this 60 minute visit was spent on counseling and coordination of care.

## 2017-09-19 NOTE — Patient Instructions (Signed)
Drink 2-3 liters of water per day    Increase salt intake    GED program    Follow up if you have any further spells

## 2017-10-04 ENCOUNTER — Ambulatory Visit: Payer: No Typology Code available for payment source | Attending: Psychosomatic Medicine

## 2017-10-04 DIAGNOSIS — F411 Generalized anxiety disorder: Secondary | ICD-10-CM
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
## 2017-10-26 MED FILL — VITAMIN D3 2000UNIT: 30 days supply | Qty: 30 | Fill #0 | Status: CP

## 2017-10-26 MED FILL — TOPIRAMATE 25MG: 30 days supply | Qty: 60 | Fill #0 | Status: CP

## 2017-10-26 MED FILL — TRAZODONE  50MG: 30 days supply | Qty: 30 | Fill #2 | Status: CP

## 2017-10-26 MED FILL — FLUOXETINE 40MG: 30 days supply | Qty: 30 | Fill #3 | Status: CP

## 2017-10-26 MED FILL — VITAMIN D 50000UNT (ERGOCALCIFEROL): 28 days supply | Qty: 4 | Fill #1 | Status: CP

## 2017-11-07 ENCOUNTER — Telehealth (HOSPITAL_BASED_OUTPATIENT_CLINIC_OR_DEPARTMENT_OTHER): Payer: Self-pay

## 2017-11-07 NOTE — Progress Notes (Signed)
Writer called patient to follow up about depression.  Update PHQ-9.    Patient was not reached. Unable to leave message. Try again another time.

## 2017-11-21 ENCOUNTER — Ambulatory Visit (HOSPITAL_BASED_OUTPATIENT_CLINIC_OR_DEPARTMENT_OTHER): Payer: Self-pay

## 2017-11-28 ENCOUNTER — Ambulatory Visit (HOSPITAL_BASED_OUTPATIENT_CLINIC_OR_DEPARTMENT_OTHER): Payer: Self-pay

## 2017-12-05 ENCOUNTER — Ambulatory Visit (HOSPITAL_BASED_OUTPATIENT_CLINIC_OR_DEPARTMENT_OTHER): Payer: Self-pay

## 2017-12-12 ENCOUNTER — Ambulatory Visit: Payer: No Typology Code available for payment source | Attending: Psychosomatic Medicine

## 2017-12-12 ENCOUNTER — Telehealth (HOSPITAL_BASED_OUTPATIENT_CLINIC_OR_DEPARTMENT_OTHER): Payer: Self-pay

## 2017-12-12 DIAGNOSIS — F331 Major depressive disorder, recurrent, moderate: Secondary | ICD-10-CM | POA: Diagnosis present
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
## 2017-12-12 NOTE — Progress Notes (Signed)
Writer called patient to follow up with message below from [place], [place]:   "Would you mind touching base with pt to get updated PHQ9 and GAD scores? If they are high I am happy to try increasing her prozac to 60mg  daily and would want to see her in office in 8 weeks for f/u. If she is just concerned about attention issue, I can ref to telepsych for other management options".     Patient was not reached.  Left a message to call me back at [phone].

## 2017-12-14 ENCOUNTER — Telehealth (HOSPITAL_BASED_OUTPATIENT_CLINIC_OR_DEPARTMENT_OTHER): Payer: Self-pay

## 2017-12-14 NOTE — Progress Notes (Signed)
On behalf of Shonna Chock, [place] writer called patient to follow up with message below:  " Would you mind touching base with pt to get updated PHQ9 and GAD scores? If they are high I am happy to try increasing her prozac to 60mg  daily and would want to see her in office in 8 weeks for f/u. If she is just concerned about attention issue, I can ref to telepsych for other management options".    Patient was not reached. Left a message to call me back at [phone].

## 2018-01-10 ENCOUNTER — Telehealth (HOSPITAL_BASED_OUTPATIENT_CLINIC_OR_DEPARTMENT_OTHER): Payer: Self-pay

## 2018-01-10 NOTE — Progress Notes (Signed)
SW received call from Kath[facility] Senna [facility].  Asking for Fax # - requesting psych clearance letter.  SW returned call to [phone] and provided fax #

## 2018-01-17 ENCOUNTER — Ambulatory Visit (HOSPITAL_BASED_OUTPATIENT_CLINIC_OR_DEPARTMENT_OTHER): Payer: No Typology Code available for payment source

## 2018-01-17 ENCOUNTER — Telehealth (HOSPITAL_BASED_OUTPATIENT_CLINIC_OR_DEPARTMENT_OTHER): Payer: Self-pay | Admitting: Obstetrics & Gynecology

## 2018-01-17 NOTE — Progress Notes (Signed)
Patient has been out reach several times to schedule surgery. Patient scheduled surgery then cancel, was out reach to reschedule, left msg to call us back.     If patient decide she wants to have surgery. She needs to schedule a new appt with GYN provider. New booking needs to be submitted.

## 2018-01-31 ENCOUNTER — Ambulatory Visit: Payer: No Typology Code available for payment source | Attending: Internal Medicine | Admitting: Physician Assistant

## 2018-01-31 ENCOUNTER — Encounter (HOSPITAL_BASED_OUTPATIENT_CLINIC_OR_DEPARTMENT_OTHER): Payer: Self-pay | Admitting: Physician Assistant

## 2018-01-31 VITALS — BP 103/67 | HR 73 | Temp 98.8°F | Resp 16 | Ht 61.0 in | Wt 138.0 lb

## 2018-01-31 DIAGNOSIS — F4310 Post-traumatic stress disorder, unspecified: Secondary | ICD-10-CM | POA: Diagnosis present

## 2018-01-31 DIAGNOSIS — F32A Depression, unspecified: Secondary | ICD-10-CM

## 2018-01-31 DIAGNOSIS — Z9884 Bariatric surgery status: Secondary | ICD-10-CM | POA: Insufficient documentation

## 2018-01-31 DIAGNOSIS — F411 Generalized anxiety disorder: Secondary | ICD-10-CM | POA: Diagnosis present

## 2018-01-31 DIAGNOSIS — F329 Major depressive disorder, single episode, unspecified: Secondary | ICD-10-CM | POA: Insufficient documentation

## 2018-01-31 DIAGNOSIS — E559 Vitamin D deficiency, unspecified: Secondary | ICD-10-CM | POA: Insufficient documentation

## 2018-01-31 DIAGNOSIS — Z83511 Family history of glaucoma: Secondary | ICD-10-CM | POA: Diagnosis present

## 2018-01-31 DIAGNOSIS — Z0000 Encounter for general adult medical examination without abnormal findings: Secondary | ICD-10-CM | POA: Diagnosis present

## 2018-01-31 DIAGNOSIS — D649 Anemia, unspecified: Secondary | ICD-10-CM | POA: Insufficient documentation

## 2018-01-31 LAB — CBC WITH PLATELET
ABSOLUTE NRBC COUNT: 0 10*3/uL (ref 0.0–0.0)
HEMATOCRIT: 34.4 % (ref 34.1–44.9)
HEMOGLOBIN: 10.8 g/dL — ABNORMAL LOW (ref 11.2–15.7)
MEAN CORP HGB CONC: 31.4 g/dL (ref 31.0–37.0)
MEAN CORPUSCULAR HGB: 28.6 pg (ref 26.0–34.0)
MEAN CORPUSCULAR VOL: 91 fl (ref 80.0–100.0)
MEAN PLATELET VOLUME: 12.3 fL (ref 8.7–12.5)
NRBC %: 0 % (ref 0.0–0.0)
PLATELET COUNT: 242 10*3/uL (ref 150–400)
RBC DISTRIBUTION WIDTH STD DEV: 42.9 fL (ref 35.1–46.3)
RED BLOOD CELL COUNT: 3.78 M/uL — ABNORMAL LOW (ref 3.90–5.20)
WHITE BLOOD CELL COUNT: 6.3 10*3/uL (ref 4.0–11.0)

## 2018-01-31 LAB — IRON: IRON: 55 ug/dL (ref 50–170)

## 2018-01-31 LAB — VITAMIN B12: VITAMIN B12: 997 pg/mL — ABNORMAL HIGH (ref 193–986)

## 2018-01-31 MED ORDER — VITAMIN D-3 125 MCG (5000 UT) PO TABS: 1 | tablet | Freq: Every day | ORAL | 11 refills | 0 days | Status: AC

## 2018-01-31 MED ORDER — TRAZODONE HCL 50 MG PO TABS
50.0000 mg | ORAL_TABLET | Freq: Every evening | ORAL | 2 refills | Status: DC
Start: 2018-01-31 — End: 2018-06-13

## 2018-01-31 MED ORDER — FLUOXETINE HCL 40 MG PO CAPS
40.0000 mg | ORAL_CAPSULE | Freq: Every day | ORAL | 5 refills | Status: DC
Start: 2018-01-31 — End: 2018-03-14

## 2018-01-31 MED ORDER — VITAMIN D-3 125 MCG (5000 UT) PO TABS
1.0000 | ORAL_TABLET | Freq: Every day | ORAL | 11 refills | Status: DC
Start: 2018-01-31 — End: 2019-03-26

## 2018-01-31 MED ORDER — MULTIVITAMIN PO TAB: 1 | tablet | Freq: Every day | ORAL | 11 refills | 0 days | Status: AC

## 2018-01-31 MED ORDER — FLUOXETINE HCL 20 MG PO CAPS: 20 mg | capsule | Freq: Every day | ORAL | 5 refills | 0 days | Status: AC

## 2018-01-31 MED ORDER — FLUOXETINE HCL 20 MG PO CAPS
20.0000 mg | ORAL_CAPSULE | Freq: Every day | ORAL | 5 refills | Status: DC
Start: 2018-01-31 — End: 2018-03-14

## 2018-01-31 MED ORDER — MULTIVITAMIN PO TAB
1.0000 | ORAL_TABLET | Freq: Every day | ORAL | 11 refills | Status: DC
Start: 2018-01-31 — End: 2018-11-12

## 2018-01-31 MED ORDER — FLUOXETINE HCL 40 MG PO CAPS: 40 mg | capsule | Freq: Every day | ORAL | 5 refills | 0 days | Status: AC

## 2018-01-31 MED ORDER — TRAZODONE HCL 50 MG PO TABS: 50 mg | tablet | Freq: Every evening | ORAL | 2 refills | 0 days | Status: AC

## 2018-01-31 MED FILL — TRAZODONE  50MG: 30 days supply | Qty: 30 | Fill #0 | Status: CP

## 2018-01-31 MED FILL — MULTIVITAMIN: 30 days supply | Qty: 30 | Fill #0 | Status: CP

## 2018-01-31 MED FILL — VITAMIN D3 5000UNIT: 30 days supply | Qty: 30 | Fill #0 | Status: CP

## 2018-01-31 MED FILL — FLUOXETINE 40MG: 30 days supply | Qty: 30 | Fill #0 | Status: CP

## 2018-01-31 MED FILL — FLUOXETINE   20MG: 30 days supply | Qty: 30 | Fill #0 | Status: CP

## 2018-01-31 NOTE — Progress Notes (Signed)
Pt feels safe at home.  Shed Nixon, [place], 01/31/2018

## 2018-01-31 NOTE — Progress Notes (Signed)
Review of Systems   Constitutional: Negative for chills, fever, malaise/fatigue and weight loss.   HENT: Negative for congestion, hearing loss and tinnitus.    Eyes: Negative for blurred vision, double vision and photophobia.   Respiratory: Negative for cough and shortness of breath.    Cardiovascular: Negative for chest pain, palpitations and leg swelling.   Gastrointestinal: Negative for abdominal pain, blood in stool, constipation, diarrhea, heartburn, melena, nausea and vomiting.   Genitourinary: Negative for dysuria, frequency, hematuria and urgency.   Musculoskeletal: Negative for joint pain and myalgias.   Skin: Negative for itching and rash.   Neurological: Negative for dizziness, tingling, tremors and headaches.   Psychiatric/Behavioral: Positive for depression. Negative for hallucinations, substance abuse and suicidal ideas. The patient is nervous/anxious.             Subjective     Kimberly Lindsey is a 37 year old female presents for CPE    MDD, GAD, PTSD - on prozac 40 mg. About to run out of medication  Seeing psychotherapist, multiple no-show appts. Since last visit with this provider things with daughter were better until 1 mo ago. Daughter moved out 1 mo ago, pt worried, not sure where she is living. Thinks she is using drugs. Pt notes trouble sleeping, needs refill trazodone. PHQ9 = 12. No SI/HI.   Feeling nervous, irritable, some stress eating but trying to continue to lose weight since h/o gastric surgery  Most Recent Weight Reading(s)  01/31/18 : 62.6 kg (138 lb)  09/19/17 : 64 kg (141 lb)  07/18/17 : 62.1 kg (137 lb)  05/09/17 : 65.3 kg (144 lb)  05/01/17 : 64.9 kg (143 lb)      Last labs showed anemia, not on iron supplement. Heavy menses q 4 weeks x 4 days.     Health Maintenance   Preventative Screening   -Physical Exam: 01/31/18   -PAP, Pelvic, breast exam: 10/2014, NIL, HPV neg   -Mammogram: age 54-45   -Bone Density: age 101-65   -Colonoscopy: age 32   -Lipids  Cholesterol (mg/dL)   Date  Value   16/11/9602 154     LOW DENSITY LIPOPROTEIN DIRECT (mg/dL)   Date Value   54/10/8117 84     HIGH DENSITY LIPOPROTEIN (mg/dL)   Date Value   14/78/2956 53     TRIGLYCERIDES (mg/dL)   Date Value   21/30/8657 134          Cardiac Testing   -ECG: NI   -Stress Test: NI     Immune Status:     Immunization History   Administered Date(s) Administered    INFLUENZA VIRUS TRI W/PRESV VACCINE 18/> YRS IM (PRIVATE) 04/20/2006    PPD 05/15/2006    Tdap (adacel) 05/27/2013           Patient Active Problem List:     Adjustment disorder with mixed anxiety and depressed mood     Obesity     Acute depression     Plantar fasciitis, bilateral     Post traumatic stress disorder (PTSD)     GAD (generalized anxiety disorder)     Neurocysticercosis     Tension headache     History of bariatric surgery     LOC (loss of consciousness) (HCC)     History of seizures as a child      Current Outpatient Medications:  traZODone (DESYREL) 50 MG tablet Take 1 tablet by mouth nightly Disp: 30 tablet Rfl: 5   FLUoxetine (PROZAC) 40  MG capsule Take 1 capsule by mouth daily Disp: 30 capsule Rfl: 5   Cholecalciferol (VITAMIN D-3) 5000 units TABS Take 1 tablet by mouth daily Disp: 30 tablet Rfl: 11   Multiple Vitamin (MULTIVITAMIN) TABS Take 1 tablet by mouth daily Disp:  Rfl:    MAGNESIUM CITRATE PO Take by mouth Disp:  Rfl:    Cyanocobalamin (B-12 PO) Take by mouth Disp:  Rfl:      No current facility-administered medications for this visit.   Review of Patient's Allergies indicates:  No Known Allergies  Social History     Socioeconomic History    Marital status: Single     Spouse name: Not on file    Number of children: Not on file    Years of education: Not on file    Highest education level: Not on file   Occupational History    Not on file   Social Needs    Financial resource strain: Not on file    Food insecurity:     Worry: Not on file     Inability: Not on file    Transportation needs:     Medical: Not on file     Non-medical:  Not on file   Tobacco Use    Smoking status: Never Smoker    Smokeless tobacco: Never Used   Substance and Sexual Activity    Alcohol use: No    Drug use: No    Sexual activity: Yes     Partners: Male     Comment: H/O abnormal pap after her last C/S, S/P cauterization, normal since then.  No H/O STDs.   Lifestyle    Physical activity:     Days per week: Not on file     Minutes per session: Not on file    Stress: Not on file   Relationships    Social connections:     Talks on phone: Not on file     Gets together: Not on file     Attends religious service: Not on file     Active member of club or organization: Not on file     Attends meetings of clubs or organizations: Not on file     Relationship status: Not on file    Intimate partner violence:     Fear of current or ex partner: Not on file     Emotionally abused: Not on file     Physically abused: Not on file     Forced sexual activity: Not on file   Other Topics Concern    Not on file   Social History Narrative    Lives with the father of her third son - and son        2 other sons are in [place] - ages 34, 4 - live with her mom    Ex husband left her - is not supporting 2 sons - told her she was on her own.        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        Lives in [place] with husband and son.     2 kids in [place]--> came to [place] 2016 to visit.        06/24/15:    Lives with sons: 1, 26, 22 yo and boyfriend. Feels safe at home    Works as Land     Past Surgical History:  02/2016: GASTRIC BYPASS; N/A  Comment:  [facility]   No date: OB ANTEPARTUM CARE CESAREAN DLVR & POSTPARTUM      Comment:  c/s x 3  No date: TUBAL LIGATION  Review of patient's family history indicates:  Problem: Hypertension      Relation: Mother          Age of Onset: (Not Specified)  Problem: Diabetes      Relation: Father          Age of Onset: (Not Specified)          Comment: died of diabetes, also cholesterol  Problem:  Lipids      Relation: Mother          Age of Onset: (Not Specified)  Problem: Cancer - Other      Relation: FamHxNeg          Age of Onset: (Not Specified)  Problem: Diabetes      Relation: Mother          Age of Onset: (Not Specified)  Problem: Thyroid      Relation: Maternal Aunt          Age of Onset: (Not Specified)          Comment: multiple aunts/uncles  Problem: Stroke      Relation: Maternal Grandmother          Age of Onset: (Not Specified)          Comment: died of stroke  Problem: Heart      Relation: Maternal Uncle          Age of Onset: 3          Comment: MI           Objective     All ROS reviewed and discussed and are otherwise negative unless listed above.         PHYSICAL EXAM:  BP 103/67 (Site: LA, Position: Sitting, Cuff Size: Reg)  Pulse 73  Temp 98.8 F (37.1 C) (Temporal)  Resp 16  Ht 5\' 1"  (1.549 m)  Wt 62.6 kg (138 lb)  LMP 01/11/2018  SpO2 100%  BMI 26.07 kg/m2    GENERAL APPEARANCE -  A&Ox3, WDWN, NAD  HEENT - head normocephalic, atraumatic, EOMI, PERRLA, conjunctiva clear bilaterally, no occular discharge. TM bilaterally gray and translucent, without bulging, erythema or exudate. Light reflex visible bilaterally. Nares patent. Nasal mucosa without erythema or visible mass. Pharynx without erythema, exudate or mass.    NECK - Neck soft, supple. No ant/posterior cervical or supraclavicular LAD .  Thyroid ~ 10-15 g with normal consistency, no palpable nodules. No tracheal deviation  BREAST - normal consistency bilat without palpable masses or tenderness. No nipple discharge or dimpling. No skin color changes or axillary lymphadenopathy  LUNGS - Lungs CTATB without WRR. Normal inspiratory effort.   CARDIOVASCULAR -  RRR without S3, S4. No murmurs, clicks, gallops or rubs.  ABDOMEN - Abdomen soft, NTND. BS normal. No masses, no hepatosplenomegaly.  Back - NTTP along spinous processes or paraspinal mm.  No CVA tenderness to percussion  Extrem - no tremor or edema. DTRs + 2 and equal UE and LE  bilat. Pulses normal and equal bilat at dorsalis pedis and posterior tibial arteries  SKIN - skin color, texture, temperature, and turgor are normal in the exposed areas without rash or concerning lesions  Neuro- CN II-XII normal and equal bilat.  Gait and station steady. Muscle tone and strength normal and equal bilat.   Psych -  Appears stated age, well groomed. Behavior calm and appropriate. Mood and affect appropriate. Appears to have good judgement and insight.     A/P:  1. Routine physical examination  UTD with screening for age, issues addressed. Counseled re: diet, exercise, sleep, safety. See below    2. GAD (generalized anxiety disorder)  Continue with psychotherapy, increase prozac to 60mg , f/u 6-8 weeks  - FLUoxetine (PROZAC) 40 MG capsule; Take 1 capsule by mouth daily  Dispense: 30 capsule; Refill: 5  - FLUoxetine (PROZAC) 20 MG capsule; Take 1 capsule by mouth daily  Dispense: 30 capsule; Refill: 5  - traZODone (DESYREL) 50 MG tablet; Take 1 tablet by mouth nightly  Dispense: 30 tablet; Refill: 2    3. Acute depression  Continue with psychotherapy, increase prozac to 60mg , f/u 6-8 weeks  - FLUoxetine (PROZAC) 40 MG capsule; Take 1 capsule by mouth daily  Dispense: 30 capsule; Refill: 5  - FLUoxetine (PROZAC) 20 MG capsule; Take 1 capsule by mouth daily  Dispense: 30 capsule; Refill: 5  - traZODone (DESYREL) 50 MG tablet; Take 1 tablet by mouth nightly  Dispense: 30 tablet; Refill: 2    4. Post traumatic stress disorder (PTSD)  Continue with psychotherapy, increase prozac to 60mg , f/u 6-8 weeks  - FLUoxetine (PROZAC) 40 MG capsule; Take 1 capsule by mouth daily  Dispense: 30 capsule; Refill: 5  - FLUoxetine (PROZAC) 20 MG capsule; Take 1 capsule by mouth daily  Dispense: 30 capsule; Refill: 5    5. History of bariatric surgery  Continue with outside specialty f/u, MVI, vitamin D. Check b12. Attn: protein and hydration  - Multiple Vitamin (MULTIVITAMIN) TABS; Take 1 tablet by mouth daily  Dispense: 30  tablet; Refill: 11  - Cholecalciferol (VITAMIN D-3) 125 MCG (5000 UT) TABS; Take 1 tablet by mouth daily  Dispense: 30 tablet; Refill: 11  - VITAMIN B12    6. Anemia, unspecified type  Will determine need for supplement pending labs  - CBC WITH PLATELET  - IRON    7. Vitamin D deficiency  Continue, refilled supplement  - Cholecalciferol (VITAMIN D-3) 125 MCG (5000 UT) TABS; Take 1 tablet by mouth daily  Dispense: 30 tablet; Refill: 11    8. Family history of glaucoma  - REFERRAL TO OPHTHALMOLOGY ( INT)      I have reviewed the past medical, surgical, social and family history and updated these sections of EpicCare as relevant. All interim labs, test results, and consult notes were reviewed and discussed with patient. Medications were reconciled during this visit and a current medication list was given to the patient at the end of the visit.      follow up 6-8 weeks    Janeal Holmeshristina E Delene Morais, PA-C

## 2018-02-03 ENCOUNTER — Encounter (HOSPITAL_BASED_OUTPATIENT_CLINIC_OR_DEPARTMENT_OTHER): Payer: Self-pay | Admitting: Physician Assistant

## 2018-02-03 DIAGNOSIS — E559 Vitamin D deficiency, unspecified: Secondary | ICD-10-CM | POA: Insufficient documentation

## 2018-02-03 DIAGNOSIS — Z83511 Family history of glaucoma: Secondary | ICD-10-CM | POA: Insufficient documentation

## 2018-02-03 DIAGNOSIS — D649 Anemia, unspecified: Secondary | ICD-10-CM | POA: Insufficient documentation

## 2018-02-06 ENCOUNTER — Telehealth (HOSPITAL_BASED_OUTPATIENT_CLINIC_OR_DEPARTMENT_OTHER): Payer: Self-pay

## 2018-02-06 NOTE — Progress Notes (Signed)
SW called to confirm pt's appt 02/14/18 at 11am w this SW.  Pt says she will attend.  We will discuss form that was sent to SW by [facility].

## 2018-02-07 ENCOUNTER — Encounter (HOSPITAL_BASED_OUTPATIENT_CLINIC_OR_DEPARTMENT_OTHER): Payer: Self-pay

## 2018-02-07 ENCOUNTER — Encounter (HOSPITAL_BASED_OUTPATIENT_CLINIC_OR_DEPARTMENT_OTHER): Payer: Self-pay | Admitting: Physician Assistant

## 2018-02-07 NOTE — Progress Notes (Signed)
Letter printed and mailed to patient.    Kimberly NonesYohana Dasean Lindsey, 02/07/2018  3:24 PM

## 2018-02-14 ENCOUNTER — Ambulatory Visit: Payer: No Typology Code available for payment source

## 2018-02-14 DIAGNOSIS — F331 Major depressive disorder, recurrent, moderate: Secondary | ICD-10-CM | POA: Diagnosis present
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
## 2018-03-14 ENCOUNTER — Ambulatory Visit: Payer: No Typology Code available for payment source

## 2018-03-14 ENCOUNTER — Encounter (HOSPITAL_BASED_OUTPATIENT_CLINIC_OR_DEPARTMENT_OTHER): Payer: Self-pay | Admitting: Physician Assistant

## 2018-03-14 ENCOUNTER — Ambulatory Visit: Payer: No Typology Code available for payment source | Attending: Physician Assistant | Admitting: Physician Assistant

## 2018-03-14 VITALS — BP 112/65 | HR 60 | Temp 98.0°F | Resp 14 | Ht 61.0 in | Wt 138.0 lb

## 2018-03-14 DIAGNOSIS — F329 Major depressive disorder, single episode, unspecified: Secondary | ICD-10-CM | POA: Diagnosis not present

## 2018-03-14 DIAGNOSIS — F331 Major depressive disorder, recurrent, moderate: Secondary | ICD-10-CM | POA: Diagnosis not present

## 2018-03-14 DIAGNOSIS — G479 Sleep disorder, unspecified: Secondary | ICD-10-CM | POA: Insufficient documentation

## 2018-03-14 DIAGNOSIS — Z8349 Family history of other endocrine, nutritional and metabolic diseases: Secondary | ICD-10-CM | POA: Diagnosis present

## 2018-03-14 DIAGNOSIS — F32A Depression, unspecified: Secondary | ICD-10-CM

## 2018-03-14 DIAGNOSIS — F4310 Post-traumatic stress disorder, unspecified: Secondary | ICD-10-CM

## 2018-03-14 DIAGNOSIS — F411 Generalized anxiety disorder: Secondary | ICD-10-CM | POA: Insufficient documentation

## 2018-03-14 LAB — THYROID SCREEN TSH REFLEX FT4: THYROID SCREEN TSH REFLEX FT4: 1.5 u[IU]/mL (ref 0.358–3.740)

## 2018-03-14 MED ORDER — FLUOXETINE HCL 20 MG PO CAPS
20.0000 mg | ORAL_CAPSULE | Freq: Every day | ORAL | 5 refills | Status: DC
Start: 2018-03-14 — End: 2018-11-12

## 2018-03-14 MED ORDER — FLUOXETINE HCL 20 MG PO CAPS: 20 mg | capsule | Freq: Every day | ORAL | 5 refills | 0 days | Status: AC

## 2018-03-14 MED ORDER — FLUOXETINE HCL 40 MG PO CAPS: 40 mg | capsule | Freq: Every day | ORAL | 5 refills | 0 days | Status: AC

## 2018-03-14 MED ORDER — FLUOXETINE HCL 40 MG PO CAPS
40.0000 mg | ORAL_CAPSULE | Freq: Every day | ORAL | 5 refills | Status: DC
Start: 2018-03-14 — End: 2018-11-12
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
## 2018-03-14 NOTE — Progress Notes (Signed)
Patient feels safe at home.tc

## 2018-03-14 NOTE — Progress Notes (Signed)
Review of Systems   Constitutional: Negative for chills, fever, malaise/fatigue and weight loss.   HENT: Negative for congestion, hearing loss and tinnitus.    Eyes: Negative for blurred vision, double vision and photophobia.   Respiratory: Negative for cough and shortness of breath.    Cardiovascular: Negative for chest pain, palpitations and leg swelling.   Gastrointestinal: Negative for abdominal pain, blood in stool, constipation, diarrhea, heartburn, melena, nausea and vomiting.   Genitourinary: Negative for dysuria, frequency, hematuria and urgency.   Musculoskeletal: Negative for joint pain and myalgias.   Skin: Negative for itching and rash.   Neurological: Negative for dizziness, tingling, tremors and headaches.   Psychiatric/Behavioral: The patient is nervous/anxious.             Subjective     Kimberly Lindsey is a 38 year old female presents for f/u multiple issues.     Increased prozac to 60mg  last visit, anxiety improving. Still with stress eating, cravings  Most Recent Weight Reading(s)  03/14/18 : 62.6 kg (138 lb)  01/31/18 : 62.6 kg (138 lb)  09/19/17 : 64 kg (141 lb)  07/18/17 : 62.1 kg (137 lb)  05/09/17 : 65.3 kg (144 lb)      Still with racing thoughts, not sleeping well. Needs to take trazodone to sleep, requesting refill.  Decreased caffeine intake from 3 cups to 1 cup per day. Seeing psychotherapist  PHQ9 = 9. No SI/HI.     Seeing bariatric surgeon s/p gastric bypass.  Stopped B12, staying on MVI and vitamin D and iron once daily. CbC showed low hgb 10.8.  Periods slightly irregular, more frequent. Sometimes 3-5 weeks. Bleeds x 3-4 days, seem to be heavier past ~ 4 mo.     Pt with 2 maternal aunts with goiter and maternal uncle with grave's disease.   Last tsh normal. Concerned if she could have      Patient Active Problem List:     Adjustment disorder with mixed anxiety and depressed mood     Acute depression     Plantar fasciitis, bilateral     Post traumatic stress disorder (PTSD)      GAD (generalized anxiety disorder)     Neurocysticercosis     Tension headache     History of bariatric surgery     LOC (loss of consciousness) (HCC)     History of seizures as a child     Anemia     Vitamin D deficiency     Family history of glaucoma      Current Outpatient Medications:  FLUoxetine (PROZAC) 40 MG capsule Take 1 capsule by mouth daily Disp: 30 capsule Rfl: 5   FLUoxetine (PROZAC) 20 MG capsule Take 1 capsule by mouth daily Disp: 30 capsule Rfl: 5   Multiple Vitamin (MULTIVITAMIN) TABS Take 1 tablet by mouth daily Disp: 30 tablet Rfl: 11   Cholecalciferol (VITAMIN D-3) 125 MCG (5000 UT) TABS Take 1 tablet by mouth daily Disp: 30 tablet Rfl: 11   traZODone (DESYREL) 50 MG tablet Take 1 tablet by mouth nightly Disp: 30 tablet Rfl: 2   MAGNESIUM CITRATE PO Take by mouth Disp:  Rfl:    Cyanocobalamin (B-12 PO) Take by mouth Disp:  Rfl:      No current facility-administered medications for this visit.   Review of Patient's Allergies indicates:  No Known Allergies  Social History     Socioeconomic History    Marital status: Single     Spouse name: Not on  file    Number of children: Not on file    Years of education: Not on file    Highest education level: Not on file   Occupational History    Not on file   Social Needs    Financial resource strain: Not on file    Food insecurity:     Worry: Not on file     Inability: Not on file    Transportation needs:     Medical: Not on file     Non-medical: Not on file   Tobacco Use    Smoking status: Never Smoker    Smokeless tobacco: Never Used   Substance and Sexual Activity    Alcohol use: No    Drug use: No    Sexual activity: Yes     Partners: Male     Comment: H/O abnormal pap after her last C/S, S/P cauterization, normal since then.  No H/O STDs.   Lifestyle    Physical activity:     Days per week: Not on file     Minutes per session: Not on file    Stress: Not on file   Relationships    Social connections:     Talks on phone: Not on file      Gets together: Not on file     Attends religious service: Not on file     Active member of club or organization: Not on file     Attends meetings of clubs or organizations: Not on file     Relationship status: Not on file    Intimate partner violence:     Fear of current or ex partner: Not on file     Emotionally abused: Not on file     Physically abused: Not on file     Forced sexual activity: Not on file   Other Topics Concern    Not on file   Social History Narrative    Lives with the father of her third son - and son        2 other sons are in [place] - ages 18, 22 - live with her mom    Ex husband left her - is not supporting 2 sons - told her she was on her own.        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        Lives in [place] with husband and son.     2 kids in [place]--> came to [place] 2016 to visit.        06/24/15:    Lives with sons: 81, 47, 26 yo and boyfriend. Feels safe at home    Works as Land     Past Surgical History:  02/2016: GASTRIC BYPASS; N/A      Comment:  [facility]   No date: OB ANTEPARTUM CARE CESAREAN DLVR & POSTPARTUM      Comment:  c/s x 3  No date: TUBAL LIGATION  Review of patient's family history indicates:  Problem: Hypertension      Relation: Mother          Age of Onset: (Not Specified)  Problem: Diabetes      Relation: Father          Age of Onset: (Not Specified)          Comment: died of diabetes, also cholesterol  Problem: Lipids      Relation: Mother  Age of Onset: (Not Specified)  Problem: Cancer - Other      Relation: FamHxNeg          Age of Onset: (Not Specified)  Problem: Diabetes      Relation: Mother          Age of Onset: (Not Specified)  Problem: Thyroid      Relation: Maternal Aunt          Age of Onset: (Not Specified)          Comment: multiple aunts/uncles  Problem: Stroke      Relation: Maternal Grandmother          Age of Onset: (Not Specified)          Comment: died of stroke  Problem: Heart       Relation: Maternal Uncle          Age of Onset: 6735          Comment: MI           Objective     All ROS reviewed and discussed and are otherwise negative unless listed above.       PHYSICAL EXAM:  BP 112/65 (Site: LA, Position: Sitting, Cuff Size: Reg)  Pulse 60  Temp 98 F (36.7 C) (Oral)  Resp 14  Ht 5\' 1"  (1.549 m)  Wt 62.6 kg (138 lb)  LMP 02/11/2018 (Exact Date)  SpO2 100%  BMI 26.07 kg/m2    GENERAL APPEARANCE - A&Ox3, WDWN, NAD  HEENT - head normocephalic, atraumatic, EOMI, PERRLA, conjunctiva clear bilaterally, no occular discharge.  NECK - Neck soft, supple. No ant/posterior cervical or supraclavicular LAD . Thyroid ~ 20g, slightly irregular, no clearly palpable nodules  LUNGS - Lungs CTATB without WRR. Normal inspiratory effort.    CARDIOVASCULAR -  RRR without S3, S4. No murmurs, clicks, gallops or rubs.  Extrem - no tremor or edema. DTRs + 2 and equal UE and LE bilat. Pulses normal and equal bilat at dorsalis pedis and posterior tibial arteries  SKIN - skin color, texture, temperature, and turgor are normal in the exposed areas without rash or concerning lesions    A/P:  1. Acute depression  Improving, meds refilled, continue psychotherapy, f/u if new/worsening symptoms, otherwise 3 mo. Will be sure thyroid abnormality not contributing  - THYROID SCREEN TSH REFLEX FT4  - FLUoxetine (PROZAC) 40 MG capsule; Take 1 capsule by mouth daily  Dispense: 30 capsule; Refill: 5  - FLUoxetine (PROZAC) 20 MG capsule; Take 1 capsule by mouth daily  Dispense: 30 capsule; Refill: 5    2. Sleep disorder  Continue trazodone, ideally would be able to come off this once anxiety is controlled.     3. GAD (generalized anxiety disorder)  Improving, meds refilled, continue psychotherapy, f/u if new/worsening symptoms, otherwise 3 mo. Will be sure thyroid abnormality not contributing  - FLUoxetine (PROZAC) 40 MG capsule; Take 1 capsule by mouth daily  Dispense: 30 capsule; Refill: 5  - FLUoxetine (PROZAC) 20 MG capsule; Take 1  capsule by mouth daily  Dispense: 30 capsule; Refill: 5    4. Post traumatic stress disorder (PTSD)  Improving, meds refilled, continue psychotherapy, f/u if new/worsening symptoms, otherwise 3 mo. Will be sure thyroid abnormality not contributing  - FLUoxetine (PROZAC) 40 MG capsule; Take 1 capsule by mouth daily  Dispense: 30 capsule; Refill: 5  - FLUoxetine (PROZAC) 20 MG capsule; Take 1 capsule by mouth daily  Dispense: 30 capsule; Refill: 5    5.  Family history of thyroid disease  If elevated TSH consider TPO ab testing  - THYROID SCREEN TSH REFLEX FT4      I have reviewed the past medical, surgical, social and family history and updated these sections of Epic[facility] as relevant. All interim labs, test results, and consult notes were reviewed and discussed with patient. Medications were reconciled during this visit and a current medication list was given to the patient at the end of the visit.      follow up 3-6 mo, sooner prn    Janeal Holmeshristina E Amberlee Garvey, PA-C

## 2018-03-14 NOTE — Patient Instructions (Addendum)
Higiene do Sono    Quantas horas voc [employer] precisa dormir?    A que horas voc sente vontade de dormir? E acordar?    Voc deve ir dormir `as  10-11pm horas e levantar `as 6-7am    Regras:    1) Durma e acorde no mesmo horrio diariamente, mesmo quando estiver de frias e nos fins de semana.     NO quebre esta regra. Voc talvez se sinta muito cansado (a) nos primeiros dias, mas voc NO DEVE mudar seu horrio de dormir.  Eventualmente, seu corpo cansado vai prevalecer e voc dormir pelo perodo prescrito todas as noites.    2) NO fique na cama mais tempo do que o recomendado acima.    3) A cama tem somente dois propsitos: relacionamento ntimo e sono. No coma, leia, trabalhe, assista TV ou outras coisas na cama.    4) Controle o nvel de barulho no quarto.    5) Controle a temperatura do quarto. Algumas pessoas preferem uma temperatura mais fria para adormecer e outra mais quente para acordar.    6) Remova relgios de parede, de pulso e beepers do quarto, assim voc no fica olhando as horas ansiosamente quando no estiver dormindo.    7) Se voc no conseguir dormir, saia da cama e do quarto. Faa alguma outra coisa e no retorne para a cama at [employer].    8) Quando for [employer], acorde. No fique enrolando ou demorando a sair da cama.    9) Quando acordar, tente receber uma dose de luz solar ou artificial. Esta exposio `a luz da manh ajudar seu corpo a regular o prprio ritmo.     10)  NO tire cochilos durante o dia.    11)  Faa entre 30 a 45 minutos de exerccios diariamente. (NO faa exerccios 2 horas antes de ir dormir).    12)  No consuma bebidas alcolicas, caf, ch, cafena ou outros estimulantes aps `as 16 horas.      Patient Education      Index Relatedtopics   Glycemic Index Diet     ________________________________________________________________________  KEY POINTS   The glycemic index (GI) ranks foods that contain carbohydrates based on how the food affects blood  glucose. A food with a high GI raises blood glucose more than a food with a medium or low GI. You need sugar in your cells for energy, but too much sugar in your blood is not good for your health.   Many things can affect the GI of a food. Its best to work with a dietitian if you want to try this kind of diet.  ________________________________________________________________________  What is the glycemic index?  Carbohydrates, or carbs, are the main source of energy for the body. Carbs get digested quickly, and the body converts them easily into blood glucose. Your body uses insulin to help move sugar from the blood into the cells. You need sugar in your cells for energy, but too much sugar in your blood is not good for your health. If your blood glucose runs too high over time (months or years), it can cause problems with your heart, eyes, kidneys, nerves, and blood vessels.   Carbohydrates are the foods that affect blood glucose the most. Examples of carbs are fruits, starchy vegetables, breads, pastas, rice, sugar, syrup, and honey. The glycemic index (GI) ranks foods that contain carbohydrates based on how the food affects blood glucose. A food with a high GI raises blood glucose  more than a food with a medium or low GI.  The glycemic index measures blood glucose effect per gram of carbohydrate in a food, not per gram of the food. This can be confusing, because a large serving of a low GI food will raise blood glucose.  Examples of foods with a low GI include most beans and lentils, vegetables such as broccoli, lettuce, and peppers, some starchy vegetables like sweet potatoes, most fruit, and many whole grain breads and cereals. Meats and fats dont contain carbohydrates, so they don't have a GI.  What affects the GI of a food?  Many things can affect the GI of a food:    The GI of a food is different when eaten alone than when you eat it with other foods. For example, eating protein with a high GI food slows  down the rate of digestion and can cut the GI effect by about one third. Combining high GI foods with low GI foods helps balance out the effect on your blood glucose.   The size of food particles affects GI. Fruit juice has a higher GI than whole fruit. Mashed potatoes have a higher GI than a baked potato.   Whether you cook foods, and how long you cook them affects the GI. For example, cooking pasta until it is soft raises the GI, while lightly cooked or al dente pasta has a lower GI.   The form of the food makes a difference. For example, long-grain white rice has a lower GI than brown rice. However, brown rice has a lower GI than short-grain white rice.   Foods high in fiber take longer to digest and therefore affect your blood glucose more slowly. For example, All-Bran cereal is a low GI food, while Puffed Wheat is a high GI food.   The higher the fat content of a food the lower its GI. Fat slows digestion, so foods such as Snickers candy bars affect your blood glucose less quickly than food such as rice cakes.   When a fruit or vegetable is ripe, it has a higher GI than when it is slightly green.   Acids in foods slow down digestion. Having meals that include some vinegar, lemon or lime juice, or pickled vegetables can lower the GI for the meal.  Following a glycemic index diet may have health benefits for people with diabetes, and possibly acne and heart disease. It has not been shown to help with weight loss. It is also important to look at the nutrient value of foods, not just the GI, when making food choices. Ask your healthcare provider for a referral to a dietitian to learn more about whether the GI diet is right for you.  Developed by [employer].  Adult Advisor 2017.1 published by [employer].  Last modified: 2014-04-15  Last reviewed: 2013-11-21  This content is reviewed periodically and is subject to change as new health information becomes available. The information is intended to inform and  educate and is not a replacement for medical evaluation, advice, diagnosis or treatment by a healthcare professional.  References   Adult Advisor 2017.1 Index    Copyright  2017 [employer], a division of [employer]. All rights reserved.

## 2018-03-15 ENCOUNTER — Telehealth (HOSPITAL_BASED_OUTPATIENT_CLINIC_OR_DEPARTMENT_OTHER): Payer: Self-pay | Admitting: Physician Assistant

## 2018-03-15 NOTE — Progress Notes (Signed)
Dear RN,    Please:    1. Create Telephone encounter for this patient.  2. Share with the patient the following abnormal results: thyroid levels are normal     Plan:  1. No further tests need to be done. .    2. Type of Outreach: 3 phone calls and if unable to reach send letter    3. Document the conversation in the Telephone Encounter and close the encounter, no need to send back to me.     Thank you,  Janeal Holmes, PA-C

## 2018-03-15 NOTE — Progress Notes (Signed)
Called and spoke with patient:thyroid levels are normal.tc

## 2018-04-11 ENCOUNTER — Ambulatory Visit (HOSPITAL_BASED_OUTPATIENT_CLINIC_OR_DEPARTMENT_OTHER): Payer: No Typology Code available for payment source

## 2018-04-19 MED FILL — TRAZODONE  50MG: 30 days supply | Qty: 30 | Fill #1 | Status: CP

## 2018-04-19 MED FILL — FLUOXETINE   20MG: 30 days supply | Qty: 30 | Fill #0 | Status: CP

## 2018-04-19 MED FILL — MULTIVITAMIN: 30 days supply | Qty: 30 | Fill #1 | Status: CP

## 2018-04-19 MED FILL — VITAMIN D3 5000UNIT: 30 days supply | Qty: 30 | Fill #1 | Status: CP

## 2018-04-19 MED FILL — FLUOXETINE 40MG: 30 days supply | Qty: 30 | Fill #0 | Status: CP

## 2018-04-20 ENCOUNTER — Ambulatory Visit: Payer: No Typology Code available for payment source | Attending: Physician Assistant | Admitting: Ophthalmology

## 2018-04-20 ENCOUNTER — Encounter (HOSPITAL_BASED_OUTPATIENT_CLINIC_OR_DEPARTMENT_OTHER): Payer: Self-pay | Admitting: Ophthalmology

## 2018-04-20 DIAGNOSIS — H524 Presbyopia: Secondary | ICD-10-CM | POA: Diagnosis not present

## 2018-04-20 DIAGNOSIS — Z83511 Family history of glaucoma: Secondary | ICD-10-CM | POA: Diagnosis not present

## 2018-04-20 DIAGNOSIS — H04123 Dry eye syndrome of bilateral lacrimal glands: Secondary | ICD-10-CM | POA: Diagnosis not present

## 2018-04-20 DIAGNOSIS — H5203 Hypermetropia, bilateral: Secondary | ICD-10-CM | POA: Diagnosis present

## 2018-04-20 DIAGNOSIS — H52203 Unspecified astigmatism, bilateral: Secondary | ICD-10-CM | POA: Insufficient documentation

## 2018-04-20 NOTE — Progress Notes (Signed)
NP / here for routine eye exam  C/o for long time am getting headache ,blurry va in distance and near , very dry eyes hard time to open the eyes in the morning   Today OCT done ou

## 2018-04-20 NOTE — Patient Instructions (Signed)
Kimberly Lindsey    Use over-the-counter artificial tears such as Refresh, Systane, Theratears, Soothe, or Visine Tears 4 X per day or as needed for symptoms of dry eye.

## 2018-04-20 NOTE — Progress Notes (Signed)
Impression,    1.Strong Family hx. Glaucoma in mother, maternal grandfather, and maternal uncles.    Her IOP was not elevated (20 OD and 18 OS.)    She had a disc OCT that revealed a robust average retinal NFL thickness of 103 microns OD and 98 microns OS)She had a generous C/D of 0.69 OD and 0.7 OS.      Plan-Recheck IOP yearly    2. Dry eyes-she was advised to Use over-the-counter artificial tears such as Refresh, Systane, Theratears, Soothe, or Visine Tears 4 X per day or as needed for symptoms of dry eye.      3.Hyperopia, astigmatism, and presbyopia-she was refracted and given a prescription to get glasses that she can fill at any optical shop.

## 2018-05-08 ENCOUNTER — Ambulatory Visit (HOSPITAL_BASED_OUTPATIENT_CLINIC_OR_DEPARTMENT_OTHER): Payer: No Typology Code available for payment source

## 2018-05-30 ENCOUNTER — Other Ambulatory Visit: Payer: Self-pay

## 2018-06-02 ENCOUNTER — Emergency Department
Admission: EM | Admit: 2018-06-02 | Discharge: 2018-06-03 | Disposition: A | Discharge status: Home / Self Care | Payer: No Typology Code available for payment source | Source: Intra-hospital | Attending: Emergency Medicine | Admitting: Emergency Medicine

## 2018-06-02 ENCOUNTER — Other Ambulatory Visit: Payer: Self-pay

## 2018-06-02 ENCOUNTER — Encounter (HOSPITAL_BASED_OUTPATIENT_CLINIC_OR_DEPARTMENT_OTHER): Payer: Self-pay

## 2018-06-02 DIAGNOSIS — R1012 Left upper quadrant pain: Secondary | ICD-10-CM | POA: Diagnosis not present

## 2018-06-02 DIAGNOSIS — R1013 Epigastric pain: Secondary | ICD-10-CM

## 2018-06-02 DIAGNOSIS — K2970 Gastritis, unspecified, without bleeding: Secondary | ICD-10-CM

## 2018-06-02 DIAGNOSIS — Z9889 Other specified postprocedural states: Secondary | ICD-10-CM

## 2018-06-02 DIAGNOSIS — Z9884 Bariatric surgery status: Secondary | ICD-10-CM | POA: Diagnosis not present

## 2018-06-02 LAB — URINALYSIS RFLX TO URINE CULT
BILIRUBIN, URINE: NEGATIVE
GLUCOSE, URINE: NEGATIVE MG/DL
KETONE, URINE: NEGATIVE MG/DL
LEUKOCYTE ESTERASE: NEGATIVE
NITRITE, URINE: NEGATIVE
OCCULT BLOOD, URINE: NEGATIVE
PH URINE: 5.5 (ref 5.0–8.0)
PROTEIN, URINE: NEGATIVE MG/DL
SPECIFIC GRAVITY URINE: 1.027 (ref 1.003–1.035)

## 2018-06-02 LAB — CBC, PLATELET & DIFFERENTIAL
ABSOLUTE BASO COUNT: 0.1 10*3/uL (ref 0.0–0.1)
ABSOLUTE EOSINOPHIL COUNT: 0 10*3/uL (ref 0.0–0.8)
ABSOLUTE IMM GRAN COUNT: 0.05 10*3/uL — ABNORMAL HIGH (ref 0.00–0.03)
ABSOLUTE LYMPH COUNT: 1.3 10*3/uL (ref 0.6–5.9)
ABSOLUTE MONO COUNT: 0.6 10*3/uL (ref 0.2–1.4)
ABSOLUTE NEUTROPHIL COUNT: 10.4 10*3/uL — ABNORMAL HIGH (ref 1.6–8.3)
ABSOLUTE NRBC COUNT: 0 10*3/uL (ref 0.0–0.0)
BASOPHIL %: 0.4 % (ref 0.0–1.2)
EOSINOPHIL %: 0.2 % (ref 0.0–7.0)
HEMATOCRIT: 31.3 % — ABNORMAL LOW (ref 34.1–44.9)
HEMOGLOBIN: 9.8 g/dL — ABNORMAL LOW (ref 11.2–15.7)
IMMATURE GRANULOCYTE %: 0.4 % (ref 0.0–0.4)
LYMPHOCYTE %: 10.8 % — ABNORMAL LOW (ref 15.0–54.0)
MEAN CORP HGB CONC: 31.3 g/dL (ref 31.0–37.0)
MEAN CORPUSCULAR HGB: 26.5 pg (ref 26.0–34.0)
MEAN CORPUSCULAR VOL: 84.6 fl (ref 80.0–100.0)
MEAN PLATELET VOLUME: 11.4 fL (ref 8.7–12.5)
MONOCYTE %: 4.4 % (ref 4.0–13.0)
NEUTROPHIL %: 83.8 % — ABNORMAL HIGH (ref 40.0–75.0)
NRBC %: 0 % (ref 0.0–0.0)
PLATELET COUNT: 342 10*3/uL (ref 150–400)
RBC DISTRIBUTION WIDTH STD DEV: 37.7 fL (ref 35.1–46.3)
RED BLOOD CELL COUNT: 3.7 M/uL — ABNORMAL LOW (ref 3.90–5.20)
WHITE BLOOD CELL COUNT: 12.4 10*3/uL — ABNORMAL HIGH (ref 4.0–11.0)

## 2018-06-02 LAB — LACTIC ACID: LACTIC ACID: 1.3 mmol/L (ref 0.4–2.0)

## 2018-06-02 LAB — BASIC METABOLIC PANEL
ANION GAP: 7 mmol/L (ref 5–15)
BUN (UREA NITROGEN): 11 mg/dL (ref 7–18)
CALCIUM: 9 mg/dL (ref 8.5–10.1)
CARBON DIOXIDE: 26 mmol/L (ref 21–32)
CHLORIDE: 106 mmol/L (ref 98–107)
CREATININE: 0.8 mg/dL (ref 0.4–1.2)
ESTIMATED GLOMERULAR FILT RATE: >60 mL/min (ref >60)
Glucose Random: 109 mg/dL (ref 74–160)
POTASSIUM: 4.1 mmol/L (ref 3.5–5.1)
SODIUM: 139 mmol/L (ref 136–145)

## 2018-06-02 LAB — HEPATIC FUNCTION PANEL
ALANINE AMINOTRANSFERASE: 17 U/L (ref 12–45)
ALBUMIN: 4.1 g/dL (ref 3.4–5.0)
ALKALINE PHOSPHATASE: 78 U/L (ref 45–117)
ASPARTATE AMINOTRANSFERASE: 16 U/L (ref 8–34)
BILIRUBIN DIRECT: <0.1 mg/dl (ref 0.0–0.2)
BILIRUBIN TOTAL: 0.3 mg/dL (ref 0.2–1.0)
TOTAL PROTEIN: 8 g/dL (ref 6.4–8.2)

## 2018-06-02 LAB — LIPASE: LIPASE: 113 U/L (ref 73–393)

## 2018-06-02 LAB — MAGNESIUM: MAGNESIUM: 2.3 mg/dL (ref 1.8–2.4)

## 2018-06-02 MED ORDER — MORPHINE SULFATE 4 MG/ML IV SOLN (SUPER ERX)
4.00 mg | Freq: Once | Status: AC
Start: 2018-06-02 — End: 2018-06-02
  Administered 2018-06-02: 4 mg via INTRAVENOUS
  Filled 2018-06-02: qty 1

## 2018-06-02 MED ORDER — ONDANSETRON HCL 4 MG/2ML IJ SOLN
4.00 mg | Freq: Once | INTRAMUSCULAR | Status: AC
Start: 2018-06-02 — End: 2018-06-02
  Administered 2018-06-02: 4 mg via INTRAVENOUS
  Filled 2018-06-02: qty 2

## 2018-06-02 MED ORDER — SODIUM CHLORIDE 0.9 % IV BOLUS
1000.0000 mL | Freq: Once | INTRAVENOUS | Status: AC
  Administered 2018-06-02: 1000 mL via INTRAVENOUS

## 2018-06-02 NOTE — ED Provider Notes (Signed)
Patient seen and evaluated with the PA/resident. Please see their ED Provider Note for additional details.    Subjective:   Hx gastric bypass 2 yrs ago and cosmetic surgery about 1 month ago. Today with epigastric and LUQ abdominal pain with n/v, no blood.     Objective:  Tender at epigastrium only. Overall very well appearing. Lungs clear.    Assessment/Plan:  CT scan obtained given hx of gastric bypass with sudden severe pain, shows only gastric wall thickening, suggestive of gastritis. Feeling much better here, and appears stable for discharge with outpatient antacid treatment, and follow up with PCP and her surgeon. RTED if sx worsen.    Burns Spain, MD  Attending Physician  [facility] Department of Emergency Medicine

## 2018-06-02 NOTE — ED Provider Notes (Signed)
EMERGENCY DEPARTMENT PHYSICIAN ASSISTANT NOTE    The ED nursing record was reviewed.   The prior medical records as available electronically through Epic were reviewed.  The mode of arrival was Relative on   .    A [place] interpreter was not used as pt declined Engineer, technical sales and spoke fluent [place].     This patient was seen with Emergency Department attending physician Dr. Cyril Mourning     CHIEF COMPLAINT    Patient presents with:  Abdominal Pain      HPI      Kimberly Lindsey is a 38 year old female pmhx depression, anxiety, asthma, and anemia, who presents to the ED with sudden onset of LUQ/epigastric pain while eating dinner at approx 5pm this evening. No hx of similar pain. Feels nauseous and had 2 episodes of NBNB emesis. Last BM earlier this morning - no black/bloody stools. Hx of gastric bypass (2018) and recent panniculectomy with abdominoplasty, fascial plication, liposuction of flanks/back/thighs with fat grafting to b/l buttocks ([facility], 04/27/2018). No complications with this surgery thus far. Not taking any narcotic pain meds. Denies smoking/drinking/drug use. No f/c, cough, uri symptoms, dysuria, hematuria, vag bleeding/discharge, chest pain, sob, dizziness, syncope, or other concerning symptoms.     Other surgical hx including c-section x3 and tubal ligation.   LMP 1 month ago.         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    PROBLEM LIST  Patient Active Problem List:     Adjustment disorder with mixed anxiety and depressed mood     Acute depression     Plantar fasciitis, bilateral     Post traumatic stress disorder (PTSD)     GAD (generalized anxiety disorder)     Neurocysticercosis     Tension headache     History of bariatric surgery     LOC (loss of consciousness) (HCC)     History  of seizures as a child     Anemia     Vitamin D deficiency     Family history of glaucoma      SURGICAL HISTORY    Past Surgical History:  02/2016: GASTRIC BYPASS; N/A      Comment:  [facility]   No date: OB ANTEPARTUM CARE CESAREAN DLVR & POSTPARTUM      Comment:  c/s x 3  No date: TUBAL LIGATION    CURRENT MEDICATIONS      Current Facility-Administered Medications:     sodium chloride 0.9 % IV bolus 1,000 mL, 1,000 mL, Intravenous, Once, Burhan Barham J. Lakai Moree, PA-C    morphine injection 4 mg, 4 mg, Intravenous, Once, Krystn Dermody J. Jazaria Jarecki, PA-C    ondansetron (ZOFRAN) injection 4 mg, 4 mg, Intravenous, Once, Misti Towle J. Menucha Dicesare, PA-C    Current Outpatient Medications:     FLUoxetine (PROZAC) 40 MG capsule, Take 1 capsule by mouth daily, Disp: 30 capsule, Rfl: 5    FLUoxetine (PROZAC) 20 MG capsule, Take 1 capsule by mouth daily, Disp: 30 capsule, Rfl: 5    Multiple Vitamin (MULTIVITAMIN) TABS, Take 1 tablet by mouth daily, Disp: 30 tablet, Rfl: 11    Cholecalciferol (VITAMIN D-3) 125 MCG (5000 UT) TABS, Take 1 tablet by mouth daily, Disp: 30 tablet, Rfl: 11  traZODone (DESYREL) 50 MG tablet, Take 1 tablet by mouth nightly, Disp: 30 tablet, Rfl: 2    MAGNESIUM CITRATE PO, Take by mouth, Disp: , Rfl:     Cyanocobalamin (B-12 PO), Take by mouth, Disp: , Rfl:     ALLERGIES    Review of Patient's Allergies indicates:  No Known Allergies    FAMILY HISTORY    Review of patient's family history indicates:  Problem: Hypertension      Relation: Mother          Age of Onset: (Not Specified)  Problem: Glaucoma      Relation: Mother          Age of Onset: (Not Specified)  Problem: Diabetes      Relation: Father          Age of Onset: (Not Specified)          Comment: died of diabetes, also cholesterol  Problem: Lipids      Relation: Mother          Age of Onset: (Not Specified)  Problem: Diabetes      Relation: Mother          Age of Onset: (Not Specified)  Problem: Thyroid      Relation: Maternal Aunt           Age of Onset: (Not Specified)          Comment: multiple aunts/uncles  Problem: Glaucoma      Relation: Maternal Aunt          Age of Onset: (Not Specified)  Problem: Stroke      Relation: Maternal Grandmother          Age of Onset: (Not Specified)          Comment: died of stroke  Problem: Heart      Relation: Maternal Uncle          Age of Onset: 24          Comment: MI  Problem: Glaucoma      Relation: Maternal Uncle          Age of Onset: (Not Specified)  Problem: Glaucoma      Relation: Maternal Grandfather          Age of Onset: (Not Specified)  Problem: Cancer - Other      Relation: FamHxNeg          Age of Onset: (Not Specified)      SOCIAL HISTORY    Social History     Socioeconomic History    Marital status: Single     Spouse name: Not on file    Number of children: Not on file    Years of education: Not on file    Highest education level: Not on file   Occupational History    Not on file   Social Needs    Financial resource strain: Not on file    Food insecurity:     Worry: Not on file     Inability: Not on file    Transportation needs:     Medical: Not on file     Non-medical: Not on file   Tobacco Use    Smoking status: Never Smoker    Smokeless tobacco: Never Used   Substance and Sexual Activity    Alcohol use: No    Drug use: No    Sexual activity: Yes     Partners: Male     Comment: H/O abnormal pap  after her last C/S, S/P cauterization, normal since then.  No H/O STDs.   Lifestyle    Physical activity:     Days per week: Not on file     Minutes per session: Not on file    Stress: Not on file   Relationships    Social connections:     Talks on phone: Not on file     Gets together: Not on file     Attends religious service: Not on file     Active member of club or organization: Not on file     Attends meetings of clubs or organizations: Not on file     Relationship status: Not on file    Intimate partner violence:     Fear of current or ex partner: Not on file     Emotionally abused:  Not on file     Physically abused: Not on file     Forced sexual activity: Not on file   Other Topics Concern    Not on file   Social History Narrative    Lives with the father of her third son - and son        2 other sons are in [place] - ages 92, 3 - live with her mom    Ex husband left her - is not supporting 2 sons - told her she was on her own.        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        Lives in [place] with husband and son.     2 kids in [place]--> came to [place] 2016 to visit.        06/24/15:    Lives with sons: 1, 69, 56 yo and boyfriend. Feels safe at home    Works as housecleaner       REVIEW OF SYSTEMS     The pertinent positives are reviewed in the HPI above. All other systems were reviewed and are negative.    PHYSICAL EXAM      Vital Signs: BP 137/73  Pulse 78  Temp 97.2 F  Resp 18  Wt 60.8 kg (134 lb)  SpO2 97%  BMI 25.32 kg/m2     Constitutional: Well-developed, Well-nourished, Non-toxic appearance. Speaking full sentences.    Distress: NAD, appears uncomfortable  NECK: Full range of motion without discomfort. Supple with no meningismus.   EYES: Pupils are equal and reactive. Extraocular movements are intact. No scleral icterus.   ENT: Clear. Mucus membranes are moist.   CV: RRR, No MRG, radial pulses 2+ B/L  PULMONARY:  CTAB, No WRR, No stridor, accessory muscle use or tripoding  ABDOMINAL: Soft, ND, +ttp in epigastrium/LUQ. No rebound, guarding or masses, No murphy's, mcburney's or rovsing's. +well healing non-infected/indurated extensive horizontal abdominal scar extending to b/l hips at level of pubic symphysis  GENITOURINARY: No CVA tenderness.   MUSCULOSKELETAL : Moving all 4 extremities. Ambulatory w/ a steady gait. The spine is straight and nontender.     SKIN: Warm and dry, no rash . The skin color and turgor are normal.       RESULTS  No results found for this visit on 06/02/18 (from the past 24 hour(s)).     RADIOLOGY  Pending CTAP read      EKG:  n/a    PROCEDURES  n/a    MEDICATIONS ADMINISTERED ON THIS VISIT  Orders Placed This Encounter  sodium chloride 0.9 % IV bolus 1,000 mL      morphine injection 4 mg      ondansetron (ZOFRAN) injection 4 mg      ED COURSE & MEDICAL DECISION MAKING      I reviewed the patient's past medical history/problem list, past surgical history, medication list, social history and allergies.    Arrival: Pt arrived in stable condition and required no immediate interventions.    ED Decision Making & Course:     Ceil Blauer is a 38 year old female pmhx depression, anxiety, asthma, and anemia, who presents to the ED with sudden onset of LUQ/epigastric pain while eating dinner at approx 5pm this evening. On exam, VS wnl, pt appears uncomfortable (cramping colicky), abd soft ND and no peritoneal signs, but +ttp in epigastrium/LUQ. Well healing, non-infected/indurated extensive horizontal abdominal scar extending to b/l hips at level of pubic symphysis.     -Labs  -Urine  -IVF, morphine   -CTAP with IV and PO contrast  -Reassess    Pt signed out to ED attending, Dr. Cyril Mourning at end of PA shift.       Elwyn Lade, PA-C  [facility]  Emergency Department

## 2018-06-02 NOTE — ED Triage Note (Signed)
Pt presents to ER c/o LLQ abd pain since 5pm.  denies N/V/D, no fever/cough/chills.

## 2018-06-03 ENCOUNTER — Emergency Department (HOSPITAL_BASED_OUTPATIENT_CLINIC_OR_DEPARTMENT_OTHER): Payer: No Typology Code available for payment source

## 2018-06-03 DIAGNOSIS — R1013 Epigastric pain: Secondary | ICD-10-CM

## 2018-06-03 DIAGNOSIS — Z9884 Bariatric surgery status: Secondary | ICD-10-CM | POA: Diagnosis not present

## 2018-06-03 DIAGNOSIS — R1012 Left upper quadrant pain: Secondary | ICD-10-CM

## 2018-06-03 MED ORDER — NORMAL SALINE FLUSH 0.9 % IV SOLN
60.00 mL | Freq: Once | INTRAVENOUS | Status: AC
Start: 2018-06-03 — End: 2018-06-03
  Administered 2018-06-03: 60 mL via INTRAVENOUS

## 2018-06-03 MED ORDER — OMEPRAZOLE 40 MG PO CPDR
40.00 mg | DELAYED_RELEASE_CAPSULE | Freq: Every day | ORAL | 0 refills | Status: AC
Start: 2018-06-03 — End: 2018-06-17

## 2018-06-03 MED ORDER — OMEPRAZOLE 40 MG PO CPDR: 40 mg | capsule | Freq: Every day | ORAL | 0 refills | 0 days | Status: AC

## 2018-06-03 MED ORDER — PANTOPRAZOLE SODIUM 40 MG PO TBEC
40.00 mg | DELAYED_RELEASE_TABLET | Freq: Once | ORAL | Status: AC
Start: 2018-06-03 — End: 2018-06-03
  Administered 2018-06-03: 40 mg via ORAL
  Filled 2018-06-03: qty 1

## 2018-06-03 MED ORDER — IOHEXOL 350 MG/ML IV SOLN
85.00 mL | Freq: Once | INTRAVENOUS | Status: AC
Start: 2018-06-03 — End: 2018-06-03
  Administered 2018-06-03: 85 mL via INTRAVENOUS

## 2018-06-03 MED ORDER — IOHEXOL 9 MG/ML PO SOLN
500.00 mL | ORAL | Status: AC | PRN
Start: 2018-06-02 — End: 2018-06-03
  Administered 2018-06-03 (×2): 500 mL via ORAL

## 2018-06-03 MED ORDER — ONDANSETRON HCL 4 MG PO TABS
4.00 mg | ORAL_TABLET | Freq: Four times a day (QID) | ORAL | 0 refills | Status: AC | PRN
Start: 2018-06-03 — End: 2018-06-06

## 2018-06-03 MED ORDER — ONDANSETRON HCL 4 MG PO TABS: 4 mg | tablet | Freq: Four times a day (QID) | ORAL | 0 refills | 0 days | Status: AC | PRN

## 2018-06-03 NOTE — Discharge Instructions (Signed)
You were seen in the emergency department for abdominal pain.  The blood tests appear okay.  The CT scan shows inflammation of the stomach, which appears to be the cause of the pain.  You were prescribed a stomach acid medicine that will treat this inflammation and help her stomach recover quickly.  This medicine is omeprazole, or Prilosec.  You were also prescribed ondansetron, which is a medicine for nausea if you need it.  Call your primary care doctor and your surgeon for follow-up appointment if you have any further symptoms.  You may return to the emergency department at any time if symptoms get worse.

## 2018-06-03 NOTE — Narrator Note (Signed)
Patient to Ct scan

## 2018-06-03 NOTE — Narrator Note (Signed)
Patient Disposition  Patient education for diagnosis, medications, activity, diet and follow-up.  Patient left ED 2:44 AM.  Patient rep received written instructions.    Interpreter to provide instructions: No    Patient belongings with patient: YES    Have all existing LDAs been addressed? N/A    Have all IV infusions been stopped? N/A    Destination: Discharged to home

## 2018-06-05 ENCOUNTER — Ambulatory Visit: Payer: No Typology Code available for payment source

## 2018-06-05 DIAGNOSIS — F331 Major depressive disorder, recurrent, moderate: Secondary | ICD-10-CM | POA: Insufficient documentation
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
## 2018-06-12 DIAGNOSIS — G479 Sleep disorder, unspecified: Secondary | ICD-10-CM | POA: Insufficient documentation

## 2018-06-12 DIAGNOSIS — Z8349 Family history of other endocrine, nutritional and metabolic diseases: Secondary | ICD-10-CM | POA: Insufficient documentation

## 2018-06-13 ENCOUNTER — Other Ambulatory Visit: Payer: Self-pay

## 2018-06-13 ENCOUNTER — Ambulatory Visit: Payer: No Typology Code available for payment source | Attending: Internal Medicine | Admitting: Physician Assistant

## 2018-06-13 ENCOUNTER — Encounter (HOSPITAL_BASED_OUTPATIENT_CLINIC_OR_DEPARTMENT_OTHER): Payer: Self-pay | Admitting: Physician Assistant

## 2018-06-13 DIAGNOSIS — D649 Anemia, unspecified: Secondary | ICD-10-CM | POA: Insufficient documentation

## 2018-06-13 DIAGNOSIS — F32A Depression, unspecified: Secondary | ICD-10-CM

## 2018-06-13 DIAGNOSIS — K2900 Acute gastritis without bleeding: Secondary | ICD-10-CM | POA: Diagnosis present

## 2018-06-13 DIAGNOSIS — F329 Major depressive disorder, single episode, unspecified: Secondary | ICD-10-CM

## 2018-06-13 DIAGNOSIS — F411 Generalized anxiety disorder: Secondary | ICD-10-CM | POA: Diagnosis present

## 2018-06-13 MED ORDER — TRAZODONE HCL 50 MG PO TABS
50.0000 mg | ORAL_TABLET | Freq: Every evening | ORAL | 2 refills | Status: DC
Start: 2018-06-13 — End: 2018-11-12

## 2018-06-13 MED ORDER — TRAZODONE HCL 50 MG PO TABS: 50 mg | tablet | Freq: Every evening | ORAL | 2 refills | 0 days | Status: AC

## 2018-06-13 NOTE — Televisit Note (Signed)
This patient was identified as meeting criteria for a televisit rather than an in person visit due to public health concerns around COVID-19. A complete assessment and plan is detailed in the note, all of which were conducted remotely using telephone technology.  Patient identity was verbally confirmed by the patient/guardian with 2 identifiers (name, date of birth, and/or address) at the beginning of the visit  Patient/guardian verbally consented to care by televisit as appropriate.   Patient/guardian was located at home during the visit and confirmed that they understood they were encouraged to be in private location due to personal health information being discussed.  Patient/guardian was informed how to access face-to-face care in the event of an emergency.  Provider was located outside the office at a secure location during the visit.  If this is a new patient visit, all available records and medical history were reviewed by the provider.  Visit length was 25 minutes and counseling was done on the diagnoses indicated in the visit.      Subjective     Kimberly Lindsey is a 38 year old female presents for f/u GAD, MDD.   Pt taking prozac  + . Compliant with medication  Last visit noted cravings (stress eating) and racing thoughts.  These things are improving, still some anxiety and trouble sleeping. Taking trazodone  1/2 tab (decreased form 1 tab) most days. In past 2 weeks notes jittery legs "like its hard to sit still".  Otherwise feels that things are improving. psychotherapy    Recent cosmetic abdominal surgery at [facility] ~ 6 weeks ago - healing well, no complications.     Recent ED visit [facility] for epigastric pain - improved with anti-acids. Pain better. At that time pt with anemia, hgb 9.8, down from 10.8 4 mo prior.  Taking iron supplement.         Patient Active Problem List:     Adjustment disorder with mixed anxiety and depressed mood     Acute depression     Plantar fasciitis, bilateral     Post  traumatic stress disorder (PTSD)     GAD (generalized anxiety disorder)     Neurocysticercosis     Tension headache     History of bariatric surgery     LOC (loss of consciousness) (HCC)     History of seizures as a child     Anemia     Vitamin D deficiency     Family history of glaucoma     H/O abdominoplasty     Sleep disorder     Family history of thyroid disease      Current Outpatient Medications:  omeprazole (PRILOSEC) 40 MG capsule Take 1 capsule by mouth daily for stomach acid  for 14 days Disp: 14 capsule Rfl: 0   FLUoxetine (PROZAC) 40 MG capsule Take 1 capsule by mouth daily Disp: 30 capsule Rfl: 5   FLUoxetine (PROZAC) 20 MG capsule Take 1 capsule by mouth daily Disp: 30 capsule Rfl: 5   Multiple Vitamin (MULTIVITAMIN) TABS Take 1 tablet by mouth daily Disp: 30 tablet Rfl: 11   Cholecalciferol (VITAMIN D-3) 125 MCG (5000 UT) TABS Take 1 tablet by mouth daily Disp: 30 tablet Rfl: 11   traZODone (DESYREL) 50 MG tablet Take 1 tablet by mouth nightly Disp: 30 tablet Rfl: 2   MAGNESIUM CITRATE PO Take by mouth Disp:  Rfl:    Cyanocobalamin (B-12 PO) Take by mouth Disp:  Rfl:      No current facility-administered  medications for this visit.   Review of Patient's Allergies indicates:  No Known Allergies  Social History     Socioeconomic History    Marital status: Single     Spouse name: Not on file    Number of children: Not on file    Years of education: Not on file    Highest education level: Not on file   Occupational History    Not on file   Social Needs    Financial resource strain: Not on file    Food insecurity:     Worry: Not on file     Inability: Not on file    Transportation needs:     Medical: Not on file     Non-medical: Not on file   Tobacco Use    Smoking status: Never Smoker    Smokeless tobacco: Never Used   Substance and Sexual Activity    Alcohol use: No    Drug use: No    Sexual activity: Yes     Partners: Male     Comment: H/O abnormal pap after her last C/S, S/P cauterization,  normal since then.  No H/O STDs.   Lifestyle    Physical activity:     Days per week: Not on file     Minutes per session: Not on file    Stress: Not on file   Relationships    Social connections:     Talks on phone: Not on file     Gets together: Not on file     Attends religious service: Not on file     Active member of club or organization: Not on file     Attends meetings of clubs or organizations: Not on file     Relationship status: Not on file    Intimate partner violence:     Fear of current or ex partner: Not on file     Emotionally abused: Not on file     Physically abused: Not on file     Forced sexual activity: Not on file   Other Topics Concern    Not on file   Social History Narrative    Lives with the father of her third son - and son        2 other sons are in [place] - ages 1312, 410 - live with her mom    Ex husband left her - is not supporting 2 sons - told her she was on her own.        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        Lives in [place] with husband and son.     2 kids in [place]--> came to [place] 2016 to visit.        06/24/15:    Lives with sons: 978, 1818, 40 yo and boyfriend. Feels safe at home    Works as Landhousecleaner     Past Surgical History:  No date: EXCISION SKIN ABD INFRAUMBILICAL PANNICULECTOMY  02/2016: GASTRIC BYPASS; N/A      Comment:  [facility]   No date: OB ANTEPARTUM CARE CESAREAN DLVR & POSTPARTUM      Comment:  c/s x 3  No date: TUBAL LIGATION  Review of patient's family history indicates:  Problem: Hypertension      Relation: Mother          Age of Onset: (Not Specified)  Problem: Glaucoma  Relation: Mother          Age of Onset: (Not Specified)  Problem: Diabetes      Relation: Father          Age of Onset: (Not Specified)          Comment: died of diabetes, also cholesterol  Problem: Lipids      Relation: Mother          Age of Onset: (Not Specified)  Problem: Diabetes      Relation: Mother          Age of Onset: (Not  Specified)  Problem: Thyroid      Relation: Maternal Aunt          Age of Onset: (Not Specified)          Comment: multiple aunts/uncles  Problem: Glaucoma      Relation: Maternal Aunt          Age of Onset: (Not Specified)  Problem: Stroke      Relation: Maternal Grandmother          Age of Onset: (Not Specified)          Comment: died of stroke  Problem: Heart      Relation: Maternal Uncle          Age of Onset: 58          Comment: MI  Problem: Glaucoma      Relation: Maternal Uncle          Age of Onset: (Not Specified)  Problem: Glaucoma      Relation: Maternal Grandfather          Age of Onset: (Not Specified)  Problem: Cancer - Other      Relation: FamHxNeg          Age of Onset: (Not Specified)           Objective     All ROS reviewed and discussed and are otherwise negative unless listed above.       PHYSICAL EXAM:  N/a      A/P:  1. Acute depression  Controlled, continue prozac 60mg  daily + psychotherapy, f/u 3-60mo  - traZODone (DESYREL) 50 MG tablet; Take 1 tablet by mouth nightly  Dispense: 30 tablet; Refill: 2    2. GAD (generalized anxiety disorder)  Improved but still mildly uncontrolled, continue prozac 60mg  + trazodone, psychotherapy.   - traZODone (DESYREL) 50 MG tablet; Take 1 tablet by mouth nightly  Dispense: 30 tablet; Refill: 2    3. Acute gastritis without hemorrhage, unspecified gastritis type  Improved, low acid diet, anti-acid prn, f/u if recurrent symptoms    4. Anemia, unspecified type  Possibly worse 2/2 cosmetic surgery 6 weeks ago. Continue daily iron supplement, add floradix otc herbal iron supplement TID. When pandemic improves recommend in office labs to recheck. Encouraged increased iron in diet, hydration.         I have reviewed the past medical, surgical, social and family history and updated these sections of EpicCare as relevant. All interim labs, test results, and consult notes were reviewed and discussed with patient. Medications were reconciled during this visit and a  current medication list was given to the patient at the end of the visit.      follow up prn, 3 mo    Janeal Holmes, PA-C

## 2018-06-28 MED FILL — DAILY-VITE: 90 days supply | Qty: 90 | Fill #2 | Status: CP

## 2018-06-28 MED FILL — FLUOXETINE 40MG: 90 days supply | Qty: 90 | Fill #1 | Status: CP

## 2018-06-28 MED FILL — VITAMIN D3 5000UNIT: 90 days supply | Qty: 90 | Fill #2 | Status: CP

## 2018-06-28 MED FILL — FLUOXETINE   20MG: 90 days supply | Qty: 90 | Fill #1 | Status: CP

## 2018-06-28 MED FILL — TRAZODONE  50MG: 90 days supply | Qty: 90 | Fill #0 | Status: CP

## 2018-07-03 ENCOUNTER — Ambulatory Visit: Payer: No Typology Code available for payment source

## 2018-07-03 ENCOUNTER — Other Ambulatory Visit: Payer: Self-pay

## 2018-07-03 DIAGNOSIS — F331 Major depressive disorder, recurrent, moderate: Secondary | ICD-10-CM | POA: Diagnosis present
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
## 2018-07-31 ENCOUNTER — Ambulatory Visit: Payer: No Typology Code available for payment source

## 2018-07-31 DIAGNOSIS — F331 Major depressive disorder, recurrent, moderate: Secondary | ICD-10-CM | POA: Diagnosis not present
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
## 2018-08-20 ENCOUNTER — Ambulatory Visit: Payer: No Typology Code available for payment source

## 2018-08-20 DIAGNOSIS — F411 Generalized anxiety disorder: Secondary | ICD-10-CM | POA: Diagnosis present
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
## 2018-08-30 ENCOUNTER — Telehealth (HOSPITAL_BASED_OUTPATIENT_CLINIC_OR_DEPARTMENT_OTHER): Payer: Self-pay

## 2018-08-30 ENCOUNTER — Encounter (HOSPITAL_BASED_OUTPATIENT_CLINIC_OR_DEPARTMENT_OTHER): Payer: Self-pay | Admitting: Internal Medicine

## 2018-08-30 DIAGNOSIS — G2581 Restless legs syndrome: Secondary | ICD-10-CM | POA: Insufficient documentation

## 2018-08-30 NOTE — Progress Notes (Signed)
LVM to schedule televisit with PCP Re: Restless leg syndrome? Marland Kitchen Await call back.    Blanchie Dessert, 08/30/2018      Dede Query, MD  [facility] Support Staff Pool            Please schedule TV with me first available

## 2018-11-12 ENCOUNTER — Encounter (HOSPITAL_BASED_OUTPATIENT_CLINIC_OR_DEPARTMENT_OTHER): Payer: Self-pay | Admitting: Physician Assistant

## 2018-11-12 ENCOUNTER — Ambulatory Visit: Payer: No Typology Code available for payment source | Attending: Internal Medicine | Admitting: Physician Assistant

## 2018-11-12 DIAGNOSIS — E559 Vitamin D deficiency, unspecified: Secondary | ICD-10-CM | POA: Diagnosis present

## 2018-11-12 DIAGNOSIS — Z9884 Bariatric surgery status: Secondary | ICD-10-CM | POA: Diagnosis present

## 2018-11-12 DIAGNOSIS — F411 Generalized anxiety disorder: Secondary | ICD-10-CM | POA: Diagnosis present

## 2018-11-12 DIAGNOSIS — D649 Anemia, unspecified: Secondary | ICD-10-CM | POA: Diagnosis present

## 2018-11-12 DIAGNOSIS — G2581 Restless legs syndrome: Secondary | ICD-10-CM | POA: Diagnosis present

## 2018-11-12 DIAGNOSIS — F4310 Post-traumatic stress disorder, unspecified: Secondary | ICD-10-CM | POA: Insufficient documentation

## 2018-11-12 DIAGNOSIS — F331 Major depressive disorder, recurrent, moderate: Secondary | ICD-10-CM

## 2018-11-12 DIAGNOSIS — R1013 Epigastric pain: Secondary | ICD-10-CM | POA: Diagnosis present

## 2018-11-12 MED ORDER — MULTIVITAMIN PO TAB
1.0000 | ORAL_TABLET | Freq: Every day | ORAL | 11 refills | Status: DC
Start: 2018-11-12 — End: 2019-03-26

## 2018-11-12 MED ORDER — FLUOXETINE HCL 40 MG PO CAPS
40.0000 mg | ORAL_CAPSULE | Freq: Every day | ORAL | 5 refills | Status: DC
Start: 2018-11-12 — End: 2019-03-26

## 2018-11-12 MED ORDER — TRAZODONE HCL 50 MG PO TABS
50.0000 mg | ORAL_TABLET | Freq: Every evening | ORAL | 2 refills | Status: DC
Start: 2018-11-12 — End: 2019-03-26

## 2018-11-12 MED ORDER — FLUOXETINE HCL 20 MG PO CAPS
20.0000 mg | ORAL_CAPSULE | Freq: Every day | ORAL | 5 refills | Status: DC
Start: 2018-11-12 — End: 2019-03-26

## 2018-11-12 MED FILL — DAILY-VITE: 30 days supply | Qty: 30 | Fill #0

## 2018-11-12 MED FILL — FLUOXETINE 40MG: 30 days supply | Qty: 30 | Fill #0

## 2018-11-12 MED FILL — FLUOXETINE   20MG: 30 days supply | Qty: 30 | Fill #0

## 2018-11-12 MED FILL — TRAZODONE  50MG: 30 days supply | Qty: 30 | Fill #0

## 2018-11-12 NOTE — Progress Notes (Signed)
Review of Systems   Constitutional: Negative for chills, fever, malaise/fatigue and weight loss.   HENT: Negative for congestion, hearing loss and tinnitus.    Eyes: Negative for blurred vision, double vision and photophobia.   Respiratory: Negative for cough and shortness of breath.    Cardiovascular: Negative for chest pain, palpitations and leg swelling.   Gastrointestinal: Negative for abdominal pain, blood in stool, constipation, diarrhea, heartburn, melena, nausea and vomiting.   Genitourinary: Negative for dysuria, frequency, hematuria and urgency.   Musculoskeletal: Negative for joint pain and myalgias.   Skin: Negative for itching and rash.   Neurological: Negative for dizziness, tingling, tremors and headaches.   Psychiatric/Behavioral: Negative for depression, hallucinations, substance abuse and suicidal ideas. The patient is nervous/anxious and has insomnia.             Subjective     Kimberly Lindsey is a 38 year old female presents for f/u MDD, GAD. Pt taking prozac 60mg  daily. Seeing psychotherapy, last visit 6/22. Pt states doing well, "some days are heavy".  Stress eating premenstrually, otherwise improved. Sleeping well "but only with medication", taking trazodone 50mg  1/2 tab most nights, sometimes 1 tab when really stressed.  Otherwise feels that mood is well controled.     Anemia after cosmetic surgery 04/2018, on iron supplement. Notes feeling tired, having leg pains    Requesting R-sided/epigastric abd pain, "not strong", dull pain radiating to middle abdomen sometimes, TTP,  requesting US in case of gallstones        Patient Active Problem List:     Adjustment disorder with mixed anxiety and depressed mood     Acute depression     Plantar fasciitis, bilateral     Post traumatic stress disorder (PTSD)     GAD (generalized anxiety disorder)     Neurocysticercosis     Tension headache     History of bariatric surgery     LOC (loss of consciousness) (HCC)     History of seizures as a child      Anemia     Vitamin D deficiency     Family history of glaucoma     H/O abdominoplasty     Sleep disorder     Family history of thyroid disease     Acute gastritis without hemorrhage     Restless legs      Current Outpatient Medications:  traZODone (DESYREL) 50 MG tablet Take 1 tablet by mouth nightly Disp: 30 tablet Rfl: 2   FLUoxetine (PROZAC) 40 MG capsule Take 1 capsule by mouth daily Disp: 30 capsule Rfl: 5   FLUoxetine (PROZAC) 20 MG capsule Take 1 capsule by mouth daily Disp: 30 capsule Rfl: 5   Multiple Vitamin (MULTIVITAMIN) TABS Take 1 tablet by mouth daily Disp: 30 tablet Rfl: 11   Cholecalciferol (VITAMIN D-3) 125 MCG (5000 UT) TABS Take 1 tablet by mouth daily Disp: 30 tablet Rfl: 11   MAGNESIUM CITRATE PO Take by mouth Disp:  Rfl:    Cyanocobalamin (B-12 PO) Take by mouth Disp:  Rfl:      No current facility-administered medications for this visit.   Review of Patient's Allergies indicates:  No Known Allergies  Social History     Socioeconomic History    Marital status: Single     Spouse name: Not on file    Number of children: Not on file    Years of education: Not on file    Highest education level: Not on file   Occupational  History    Not on file   Social Needs    Financial resource strain: Not on file    Food insecurity:     Worry: Not on file     Inability: Not on file    Transportation needs:     Medical: Not on file     Non-medical: Not on file   Tobacco Use    Smoking status: Never Smoker    Smokeless tobacco: Never Used   Substance and Sexual Activity    Alcohol use: No    Drug use: No    Sexual activity: Yes     Partners: Male     Comment: H/O abnormal pap after her last C/S, S/P cauterization, normal since then.  No H/O STDs.   Lifestyle    Physical activity:     Days per week: Not on file     Minutes per session: Not on file    Stress: Not on file   Relationships    Social connections:     Talks on phone: Not on file     Gets together: Not on file     Attends religious  service: Not on file     Active member of club or organization: Not on file     Attends meetings of clubs or organizations: Not on file     Relationship status: Not on file    Intimate partner violence:     Fear of current or ex partner: Not on file     Emotionally abused: Not on file     Physically abused: Not on file     Forced sexual activity: Not on file   Other Topics Concern    Not on file   Social History Narrative    Lives with the father of her third son - and son        2 other sons are in [place] - ages 82, 48 - live with her mom    Ex husband left her - is not supporting 2 sons - told her she was on her own.        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        Lives in [place] with husband and son.     2 kids in [place]--> came to [place] 2016 to visit.        06/24/15:    Lives with sons: 28, 22, 9 yo and boyfriend. Feels safe at home    Works as Engineer, building services     Past Surgical History:  No date: EXCISION SKIN ABD INFRAUMBILICAL PANNICULECTOMY  02/2016: GASTRIC BYPASS; N/A      Comment:  [facility]   No date: OB ANTEPARTUM CARE CESAREAN DLVR & POSTPARTUM      Comment:  c/s x 3  No date: TUBAL LIGATION  Review of patient's family history indicates:  Problem: Hypertension      Relation: Mother          Age of Onset: (Not Specified)  Problem: Glaucoma      Relation: Mother          Age of Onset: (Not Specified)  Problem: Diabetes      Relation: Father          Age of Onset: (Not Specified)          Comment: died of diabetes, also cholesterol  Problem: Lipids      Relation: Mother  Age of Onset: (Not Specified)  Problem: Diabetes      Relation: Mother          Age of Onset: (Not Specified)  Problem: Thyroid      Relation: Maternal Aunt          Age of Onset: (Not Specified)          Comment: multiple aunts/uncles  Problem: Glaucoma      Relation: Maternal Aunt          Age of Onset: (Not Specified)  Problem: Stroke      Relation: Maternal Grandmother           Age of Onset: (Not Specified)          Comment: died of stroke  Problem: Heart      Relation: Maternal Uncle          Age of Onset: 27          Comment: MI  Problem: Glaucoma      Relation: Maternal Uncle          Age of Onset: (Not Specified)  Problem: Glaucoma      Relation: Maternal Grandfather          Age of Onset: (Not Specified)  Problem: Cancer - Other      Relation: FamHxNeg          Age of Onset: (Not Specified)           Objective     All ROS reviewed and discussed and are otherwise negative unless listed above.       PHYSICAL EXAM:  Pt sounds comfortable on phone, speaking in full sentences without cough, sob or wheezing. Normal affect.         A/P:  1. Moderate episode of recurrent major depressive disorder (HCC)  Generally well controlled but would benefit from resuming therapy, pt to schedule. Continue meds, refilled, f/u 3 mo. Attn: self care, exercise, sleep hygiene  - FLUoxetine (PROZAC) 20 MG capsule; Take 1 capsule by mouth daily  Dispense: 30 capsule; Refill: 5  - FLUoxetine (PROZAC) 40 MG capsule; Take 1 capsule by mouth daily  Dispense: 30 capsule; Refill: 5  - traZODone (DESYREL) 50 MG tablet; Take 1 tablet by mouth nightly  Dispense: 30 tablet; Refill: 2    2. GAD (generalized anxiety disorder)  See above  - FLUoxetine (PROZAC) 20 MG capsule; Take 1 capsule by mouth daily  Dispense: 30 capsule; Refill: 5  - FLUoxetine (PROZAC) 40 MG capsule; Take 1 capsule by mouth daily  Dispense: 30 capsule; Refill: 5  - traZODone (DESYREL) 50 MG tablet; Take 1 tablet by mouth nightly  Dispense: 30 tablet; Refill: 2    3. Post traumatic stress disorder (PTSD)  See above  - FLUoxetine (PROZAC) 20 MG capsule; Take 1 capsule by mouth daily  Dispense: 30 capsule; Refill: 5  - FLUoxetine (PROZAC) 40 MG capsule; Take 1 capsule by mouth daily  Dispense: 30 capsule; Refill: 5    4. Anemia, unspecified type  Will advise re: supplement pending lab results.   - CBC WITH PLATELET; Future  - IRON; Future    5.  Epigastric pain  Routing to team to schedule provider visit in clininc for eva;    6. Restless legs  Possible 2/2 anemia, will check CBC and b12 to r/o common etiologies, increase exercise, f/u prn if new/worsening symptoms  - VITAMIN B12; Future    7. Vitamin D deficiency  Will advise re: supplement  dose pending results  - VITAMIN D,25 HYDROXY; Future    8. History of bariatric surgery  - Multiple Vitamin (MULTIVITAMIN) TABS; Take 1 tablet by mouth daily  Dispense: 30 tablet; Refill: 11    Screening for possible COVID:  1. Does the patient have cough, shortness of breath, or runny nose, including chronic conditions? No  2. In the past 14 days, has the patient:  a. had new fever, cough, shortness of breath, body aches, runny nose, sore throat, nausea, diarrhea, or lack of smell/taste? OR  No   b. has the patient had COVID or suspected COVID?  No      I have reviewed the past medical, surgical, social and family history and updated these sections of EpicCare as relevant. All interim labs, test results, and consult notes were reviewed and discussed with patient. Medications were reconciled during this visit and a current medication list was given to the patient at the end of the visit.      follow up as above, 3 mo  Janeal Holmeshristina E Dante Cooter, PA-C

## 2018-11-16 ENCOUNTER — Telehealth (HOSPITAL_BASED_OUTPATIENT_CLINIC_OR_DEPARTMENT_OTHER): Payer: Self-pay

## 2018-11-16 NOTE — Progress Notes (Signed)
[facility] Outreach  Call made to Kimberly Lindsey forcovid screen. No interpreter needed.   Patient (self) did not answer at [phone]; this team member left a message asking for a call back.  I have completed the following communication and reminder steps:  Kimberly Lindsey, 11/16/2018    On behalf of LMB:EMLJ Helayne Seminole, MD

## 2018-11-19 ENCOUNTER — Ambulatory Visit: Payer: No Typology Code available for payment source | Attending: Internal Medicine | Admitting: Internal Medicine

## 2018-11-19 ENCOUNTER — Encounter (HOSPITAL_BASED_OUTPATIENT_CLINIC_OR_DEPARTMENT_OTHER): Payer: Self-pay | Admitting: Internal Medicine

## 2018-11-19 ENCOUNTER — Other Ambulatory Visit (HOSPITAL_BASED_OUTPATIENT_CLINIC_OR_DEPARTMENT_OTHER): Payer: Self-pay

## 2018-11-19 VITALS — BP 122/71 | HR 66 | Temp 97.3°F | Wt 139.0 lb

## 2018-11-19 DIAGNOSIS — D649 Anemia, unspecified: Secondary | ICD-10-CM | POA: Diagnosis not present

## 2018-11-19 DIAGNOSIS — R1011 Right upper quadrant pain: Secondary | ICD-10-CM | POA: Insufficient documentation

## 2018-11-19 DIAGNOSIS — E559 Vitamin D deficiency, unspecified: Secondary | ICD-10-CM | POA: Insufficient documentation

## 2018-11-19 DIAGNOSIS — N898 Other specified noninflammatory disorders of vagina: Secondary | ICD-10-CM | POA: Diagnosis present

## 2018-11-19 DIAGNOSIS — Z9884 Bariatric surgery status: Secondary | ICD-10-CM | POA: Insufficient documentation

## 2018-11-19 LAB — COMPREHENSIVE METABOLIC PANEL
ALANINE AMINOTRANSFERASE: 14 U/L (ref 12–45)
ALBUMIN: 3.9 g/dL (ref 3.4–5.0)
ALKALINE PHOSPHATASE: 83 U/L (ref 45–117)
ANION GAP: 6 mmol/L (ref 5–15)
ASPARTATE AMINOTRANSFERASE: 12 U/L (ref 8–34)
BILIRUBIN TOTAL: 0.3 mg/dL (ref 0.2–1.0)
BUN (UREA NITROGEN): 9 mg/dL (ref 7–18)
CALCIUM: 8.3 mg/dL — ABNORMAL LOW (ref 8.5–10.1)
CARBON DIOXIDE: 25 mmol/L (ref 21–32)
CHLORIDE: 108 mmol/L — ABNORMAL HIGH (ref 98–107)
CREATININE: 0.8 mg/dL (ref 0.4–1.2)
ESTIMATED GLOMERULAR FILT RATE: >60 mL/min (ref >60)
Glucose Random: 74 mg/dL (ref 74–160)
POTASSIUM: 4.1 mmol/L (ref 3.5–5.1)
SODIUM: 139 mmol/L (ref 136–145)
TOTAL PROTEIN: 7.5 g/dL (ref 6.4–8.2)

## 2018-11-19 LAB — CBC WITH PLATELET
ABSOLUTE NRBC COUNT: 0 10*3/uL (ref 0.0–0.0)
HEMATOCRIT: 31.2 % — ABNORMAL LOW (ref 34.1–44.9)
HEMOGLOBIN: 8.9 g/dL — ABNORMAL LOW (ref 11.2–15.7)
MEAN CORP HGB CONC: 28.5 g/dL — ABNORMAL LOW (ref 31.0–37.0)
MEAN CORPUSCULAR HGB: 22.5 pg — ABNORMAL LOW (ref 26.0–34.0)
MEAN CORPUSCULAR VOL: 79 fl — ABNORMAL LOW (ref 80.0–100.0)
MEAN PLATELET VOLUME: 12.4 fL (ref 8.7–12.5)
NRBC %: 0 % (ref 0.0–0.0)
PLATELET COUNT: 315 10*3/uL (ref 150–400)
RBC DISTRIBUTION WIDTH STD DEV: 43.7 fL (ref 35.1–46.3)
RED BLOOD CELL COUNT: 3.95 M/uL (ref 3.90–5.20)
WHITE BLOOD CELL COUNT: 7.7 10*3/uL (ref 4.0–11.0)

## 2018-11-19 LAB — VITAMIN B12: VITAMIN B12: 530 pg/mL (ref 193–986)

## 2018-11-19 LAB — IRON: IRON: 18 ug/dL — ABNORMAL LOW (ref 50–170)

## 2018-11-19 LAB — VITAMIN D,25 HYDROXY: VITAMIN D,25 HYDROXY: 25 ng/mL — ABNORMAL LOW (ref 30.0–100.0)

## 2018-11-19 LAB — TOTAL IRON BINDING CAPACITY: TOTAL IRON BIND CAPACITY CALC: 589 ug/dL (ref 280–504)

## 2018-11-19 LAB — LIPASE: LIPASE: 66 U/L — ABNORMAL LOW (ref 73–393)

## 2018-11-19 NOTE — Progress Notes (Signed)
Kimberly Lindsey complains of right upper quadrant abdominal pain, with 7 episdoes of spontaneous vomiting over the past 2-3 months. Discomfort can be associated with bloating.  She has had increased appetite which she hasn't been able to control (eats a lot, then sometimes spontaneously vomits without self-inducing).  No fever, chills, sweats.  No diarrhea.  Has had lots of bad smelling flatus (no burping), and bad smelling stools.  She denies any changes in her diet.  Of note, she had bariatric surgery 3 years ago, and had abdominoplasty one year ago.  She was told at the time of the gastric bypass to be on the lookout for symptoms of gallstones because was at increased risk.      She has noticed a big increase in her stress levels. Her 40 yo son and his 40 year old partner had been living in her house while the partner was pregnant.  One month after she delivered, she told Kimberly Lindsey to either keep the baby or she would give the baby away.  Kimberly Lindsey felt she had no choice but to keep the baby.  She also has her husband, her 40 yo daughter, and 40 year old in the house as well.     She also notes that she has gotten marked vaginal itching before her last three periods.      Note: She had a significant anemia at the time of the abdominoplasty.  She is at risk for iron deficiency, vitamin D deficiency, vitamin B12 deficiency because of the gastric surgery.    Chronic medical issues include:    Psychiatric (Fluoxetine 60 mg qd  A.  Adjustment disorder with mixed anxiety and depressed mood  B.  PTSD  C.  Generalized anxiety disorder    Neurologic  A.  Neurocysticercosis  B.  Tension headache  C.  Hx LOC  D.  Hx seizures as child    Sleep disorder (Trazodone 50 mg qhs)  A.  Restless legs.    Gastrointestinal  A.  Hx of bariatric surgery 2018  B.  Hx of abdominoplasty 2020  C.  Acute gastritis 06/13/18    Vitamin deficiency  A.  Vitamin D (?vitamin D)  B.  ?Vitamin B12 (vitamin B12)  C.  Multivitamin    Anemia    Plantar  fasciitis, bilat    Objective:  Blood Pressure 122/71    Pulse 66    Temperature 97.3 F (36.3 C) (Temporal)    Weight 63 kg (139 lb)    Last Menstrual Period 10/18/2018 (Approximate)    Oxygen Saturation 100%    Body Mass Index 26.26 kg/m     Chest clear to percussion and auscultation  Car: RRR.  Normal S1, S2.  Abd: BS active.  Well healed low abdominal scar.  Has some RUQ tenderness at Traubs point.  No tenderness with thump over liver or spleen, and no masses.      Assessment and Plan:  (R10.11) Right upper quadrant abdominal pain  (primary encounter diagnosis)  (Z98.84) History of bariatric surgery  (D64.9) Anemia, unspecified type  (E55.9) Vitamin D deficiency  Comment: The bariatric surgery does put her at risk for several issues including gallstones, marginal ulcer disease, iron deficiency, other vitamin deficiency.  Hepatitis and pancreatitis are possible. Unclear if her surgery created a blind loop, but if so could have symptoms related to bacterial overgrowth.  Will obtain labs, and follow up in one week.  Plan: US ABDOMEN COMPLETE,   COMPREHENSIVE METABOLIC PANEL,  IRON,   TOTAL IRON BINDING CAPACITY,   VITAMIN B12,  VITAMIN D,25 HYDROXY  CBC WITH PLATELET      (N89.8) Vaginal itching  Comment: Will obtain vaginitis panel, and urine for chlamydia and GC.  Plan: VAGINITIS PANEL PCR, CHLAMYDIA GC NAAT,

## 2018-11-19 NOTE — Telephone Encounter (Signed)
Pt had an apt today and left without her labs being drawn. If she calls back please schedule a lab only visit.  Covid screening not done.  Kimberly Lindsey

## 2018-11-20 LAB — CHLAMYDIA GC NAAT
CHLAMYDIA TRACHOMATIS NAAT: NEGATIVE
NEISSERIA GONORRHOEAE NAAT: NEGATIVE

## 2018-11-20 LAB — VAGINITIS PANEL PCR
BACTERIAL VAGINOSIS: NEGATIVE
CANDIDA GLABRATA: NEGATIVE
CANDIDA KRUSEI: POSITIVE
CANDIDA SPECIES: POSITIVE
TRICHOMONAS VAGINALIS: NEGATIVE

## 2018-11-30 ENCOUNTER — Other Ambulatory Visit (HOSPITAL_BASED_OUTPATIENT_CLINIC_OR_DEPARTMENT_OTHER): Payer: Self-pay | Admitting: Internal Medicine

## 2018-11-30 DIAGNOSIS — D509 Iron deficiency anemia, unspecified: Secondary | ICD-10-CM

## 2018-11-30 DIAGNOSIS — N760 Acute vaginitis: Secondary | ICD-10-CM

## 2018-11-30 MED ORDER — FERROUS SULFATE 325 (65 FE) MG PO TABS
325.00 mg | ORAL_TABLET | Freq: Two times a day (BID) | ORAL | 2 refills | Status: AC
Start: 2018-11-30 — End: 2019-01-29

## 2018-11-30 MED ORDER — FLUCONAZOLE 150 MG PO TABS
ORAL_TABLET | ORAL | 0 refills | Status: AC
Start: 2018-11-30 — End: 2018-12-02

## 2018-11-30 MED FILL — FERROUS SULF 325MG: 30 days supply | Qty: 60 | Fill #0

## 2018-11-30 MED FILL — FLUCONAZOLE 150MG: 1 days supply | Qty: 1 | Fill #0

## 2018-12-01 ENCOUNTER — Encounter (HOSPITAL_BASED_OUTPATIENT_CLINIC_OR_DEPARTMENT_OTHER): Payer: Self-pay | Admitting: Internal Medicine

## 2018-12-01 ENCOUNTER — Telehealth (HOSPITAL_BASED_OUTPATIENT_CLINIC_OR_DEPARTMENT_OTHER): Payer: Self-pay | Admitting: Internal Medicine

## 2018-12-01 DIAGNOSIS — E559 Vitamin D deficiency, unspecified: Secondary | ICD-10-CM

## 2018-12-01 DIAGNOSIS — D509 Iron deficiency anemia, unspecified: Secondary | ICD-10-CM

## 2018-12-01 MED ORDER — ERGOCALCIFEROL 1.25 MG (50000 UT) PO CAPS
50000.00 [IU] | ORAL_CAPSULE | ORAL | 0 refills | Status: AC
Start: 2018-12-01 — End: 2019-01-26

## 2018-12-01 MED ORDER — CHOLECALCIFEROL 50 MCG (2000 UT) PO TABS
2000.0000 [IU] | ORAL_TABLET | Freq: Every day | ORAL | 11 refills | Status: DC
Start: 2018-12-01 — End: 2019-03-26

## 2018-12-01 NOTE — Progress Notes (Signed)
I attempted to contact Ms. Kimberly Lindsey to discuss lab test results (marked iron deficiency anemia, and vaginitis panel showing Candida).    Her Hb is 8.9 and has progressively dropped since Dec 2019.  I have sent prescriptions for iron supplement (Fe sulfate 325 mg bid with food) and diflucan 150 mg single dose to pharmacy.

## 2018-12-02 MED FILL — *VITAMIN D 50000UNT: 56 days supply | Qty: 8 | Fill #0

## 2018-12-02 MED FILL — VITAMIN D 2000UNIT: 30 days supply | Qty: 30 | Fill #0

## 2018-12-03 ENCOUNTER — Telehealth (HOSPITAL_BASED_OUTPATIENT_CLINIC_OR_DEPARTMENT_OTHER): Payer: Self-pay | Admitting: Registered Nurse

## 2018-12-03 NOTE — Telephone Encounter (Signed)
-----   Message from Marcha Dutton, MD sent at 12/01/2018 12:03 PM EDT -----  Regarding: Marked iron deficiency anemia, and Candida on vaginitis panel  Can you please try to contact Ms. Kimberly Lindsey.  I have tried twice without success.  I have sent prescriptions to her pharmacy but haven't been able to tell her about the very low Hb 8.9, or the positive vaginitis panel result. If you cannot get her, can you please ask [facility] to print and send the lab letter to her.  (Are we able to get lab letter written in [place]?)

## 2018-12-03 NOTE — Progress Notes (Signed)
Called pt, was not able to reach pt.  Message was relayed in portuguese  Pt was encouraged to call the clinic to discuss her lab results.  Was informed a prescription has been sent to her

## 2018-12-05 NOTE — Progress Notes (Signed)
Outreached to patient. Message re: results and prescriptions provided to patient from [facility]. Patient verbalized understanding Patient is picking up the prescriptions today.   Patient without further questions or concerns at this time.  Esaw Dace, RN 12/05/18 4:52 PM

## 2018-12-05 NOTE — Telephone Encounter (Signed)
-----   Message from [place] sent at 12/05/2018  4:08 PM EDT -----  Regarding: Returning Call  Contact: [phone]  Kimberly Lindsey,  [phone],  38 year old,  female  Telephone Information:  Home Phone      [phone]  Work Phone      Not on file.  Mobile          [phone]    Patient's PCP: Katharine Look, MD    Patient's language of care:   [place]  Caller does not need an interpreter.    Person calling on behalf of patient: Patient (self) / Kimberly Lindsey      Calls today : Returning call to discuss labs.     CALL BACK NUMBER:  [phone]

## 2018-12-07 ENCOUNTER — Other Ambulatory Visit: Payer: Self-pay

## 2018-12-07 ENCOUNTER — Ambulatory Visit
Admission: RE | Admit: 2018-12-07 | Discharge: 2018-12-07 | Disposition: A | Discharge status: Home / Self Care | Payer: No Typology Code available for payment source | Attending: Internal Medicine | Admitting: Internal Medicine

## 2018-12-07 DIAGNOSIS — R1011 Right upper quadrant pain: Secondary | ICD-10-CM

## 2018-12-07 DIAGNOSIS — R1110 Vomiting, unspecified: Secondary | ICD-10-CM | POA: Diagnosis not present

## 2018-12-07 DIAGNOSIS — Z9884 Bariatric surgery status: Secondary | ICD-10-CM | POA: Diagnosis not present

## 2018-12-19 ENCOUNTER — Telehealth (HOSPITAL_BASED_OUTPATIENT_CLINIC_OR_DEPARTMENT_OTHER): Payer: Self-pay | Admitting: Registered Nurse

## 2018-12-19 NOTE — Telephone Encounter (Signed)
-----   Message from Marcha Dutton, MD sent at 12/19/2018  1:35 PM EDT -----  Regarding: Ultrasound for abdominal pain  Can you please let Ms. Pasqual know that the ultrasound did not show any abnormalities.  Thanks!

## 2018-12-19 NOTE — Telephone Encounter (Signed)
Call to patient with normal results as below. Patient verbalized understanding and is without further questions or concerns at this time.  Esaw Dace, RN 12/19/18 4:37 PM            Marcha Dutton, MD  [facility] R[facility] Pool              Can you please let Ms. Duclos know that the ultrasound did not show any abnormalities.   Thanks!

## 2019-01-03 ENCOUNTER — Telehealth (HOSPITAL_BASED_OUTPATIENT_CLINIC_OR_DEPARTMENT_OTHER): Payer: Self-pay

## 2019-01-03 ENCOUNTER — Other Ambulatory Visit (HOSPITAL_BASED_OUTPATIENT_CLINIC_OR_DEPARTMENT_OTHER): Payer: Self-pay | Admitting: Internal Medicine

## 2019-01-03 DIAGNOSIS — D509 Iron deficiency anemia, unspecified: Secondary | ICD-10-CM

## 2019-01-03 NOTE — Telephone Encounter (Signed)
Can you please let Ms. Flatley know I would like her to repeat the blood counts now that she has been taking the iron supplement. I have put in the order. Thanks!     Called and left message on VM to call and schedule lab visit  Either at [facility] or [facility]. Per Dr.Jones from above message.    Michela Pitcher 01/03/19

## 2019-03-26 ENCOUNTER — Ambulatory Visit: Payer: No Typology Code available for payment source | Attending: Internal Medicine | Admitting: Physician Assistant

## 2019-03-26 ENCOUNTER — Encounter (HOSPITAL_BASED_OUTPATIENT_CLINIC_OR_DEPARTMENT_OTHER): Payer: Self-pay | Admitting: Physician Assistant

## 2019-03-26 DIAGNOSIS — Z9884 Bariatric surgery status: Secondary | ICD-10-CM | POA: Diagnosis present

## 2019-03-26 DIAGNOSIS — F4310 Post-traumatic stress disorder, unspecified: Secondary | ICD-10-CM | POA: Diagnosis present

## 2019-03-26 DIAGNOSIS — F331 Major depressive disorder, recurrent, moderate: Secondary | ICD-10-CM | POA: Insufficient documentation

## 2019-03-26 DIAGNOSIS — F411 Generalized anxiety disorder: Secondary | ICD-10-CM | POA: Insufficient documentation

## 2019-03-26 DIAGNOSIS — N921 Excessive and frequent menstruation with irregular cycle: Secondary | ICD-10-CM | POA: Insufficient documentation

## 2019-03-26 DIAGNOSIS — D649 Anemia, unspecified: Secondary | ICD-10-CM

## 2019-03-26 DIAGNOSIS — E559 Vitamin D deficiency, unspecified: Secondary | ICD-10-CM | POA: Diagnosis present

## 2019-03-26 MED ORDER — FLUOXETINE HCL 20 MG PO CAPS
20.0000 mg | ORAL_CAPSULE | Freq: Every day | ORAL | 5 refills | Status: DC
Start: 2019-03-26 — End: 2020-06-30

## 2019-03-26 MED ORDER — FLUOXETINE HCL 40 MG PO CAPS
40.0000 mg | ORAL_CAPSULE | Freq: Every day | ORAL | 5 refills | Status: DC
Start: 2019-03-26 — End: 2020-06-30

## 2019-03-26 MED ORDER — FERROUS SULFATE 325 (65 FE) MG PO TBEC
325.00 mg | DELAYED_RELEASE_TABLET | Freq: Three times a day (TID) | ORAL | 11 refills | Status: AC
Start: 2019-03-26 — End: 2020-03-25

## 2019-03-26 MED ORDER — TRAZODONE HCL 50 MG PO TABS
50.0000 mg | ORAL_TABLET | Freq: Every evening | ORAL | 2 refills | Status: DC
Start: 2019-03-26 — End: 2020-06-30

## 2019-03-26 MED ORDER — MULTIVITAMIN PO TAB
1.0000 | ORAL_TABLET | Freq: Every day | ORAL | 11 refills | Status: DC
Start: 2019-03-26 — End: 2022-01-18

## 2019-03-26 MED ORDER — CHOLECALCIFEROL 50 MCG (2000 UT) PO TABS
2000.0000 [IU] | ORAL_TABLET | Freq: Every day | ORAL | 11 refills | Status: DC
Start: 2019-03-26 — End: 2020-07-31

## 2019-03-26 NOTE — Progress Notes (Signed)
Review of Systems   Constitutional: Negative for chills and fever.   HENT: Negative for congestion, hearing loss and tinnitus.    Eyes: Negative for photophobia.   Respiratory: Negative for cough and shortness of breath.    Cardiovascular: Negative for chest pain, palpitations and leg swelling.   Gastrointestinal: Negative for abdominal pain, blood in stool, constipation, diarrhea, nausea and vomiting.   Genitourinary: Negative for dysuria, frequency, hematuria and urgency.   Musculoskeletal: Negative for myalgias.   Skin: Negative for rash.   Neurological: Negative for dizziness, tremors and headaches.              Subjective   Kimberly Lindsey is a 39 year old female presents with concern re: fatigue, body aches, thinks 2/2 low vitamin levels. Getting periods twice monthly for past 2 months for 1 week at a time.  Last labs 10/2018 showed iron def anemia, is taking supplement once daily ferrous sulfate 325 mg (ran out of medication ~ 1 week ago).  Also taking liquid iron supplement 76mL daily (contains 120 ml) + MVI, vit D and B12 supplement.   No known fibroids  Reports anemia present prior to bariatric surgery (since childhood), used to treat in [place] with iron infusions.                Patient Active Problem List:     Adjustment disorder with mixed anxiety and depressed mood     Acute depression     Plantar fasciitis, bilateral     Post traumatic stress disorder (PTSD)     GAD (generalized anxiety disorder)     Neurocysticercosis     Tension headache     History of bariatric surgery     LOC (loss of consciousness) ([place])     History of seizures as a child     Anemia     Vitamin D deficiency     Family history of glaucoma     H/O abdominoplasty     Sleep disorder     Family history of thyroid disease     Acute gastritis without hemorrhage     Restless legs        ergocalciferol (VITAMIN D2) 50000 UNIT capsule, Take 1 capsule by mouth once a week, Disp: 8 capsule, Rfl: 0      cholecalciferol (VITAMIN  D3) 2000 UNIT TABS tablet, Take 1 tablet by mouth daily Start taking this after you have finished the 8 week course of weekly high dose vitamin D., Disp: 30 tablet, Rfl: 11      FLUoxetine (PROZAC) 20 MG capsule, Take 1 capsule by mouth daily, Disp: 30 capsule, Rfl: 5      FLUoxetine (PROZAC) 40 MG capsule, Take 1 capsule by mouth daily, Disp: 30 capsule, Rfl: 5      traZODone (DESYREL) 50 MG tablet, Take 1 tablet by mouth nightly, Disp: 30 tablet, Rfl: 2      Multiple Vitamin (MULTIVITAMIN) TABS, Take 1 tablet by mouth daily, Disp: 30 tablet, Rfl: 11      Cholecalciferol (VITAMIN D-3) 125 MCG (5000 UT) TABS, Take 1 tablet by mouth daily, Disp: 30 tablet, Rfl: 11      MAGNESIUM CITRATE PO, Take by mouth, Disp: , Rfl:       Cyanocobalamin (B-12 PO), Take by mouth, Disp: , Rfl:     No current facility-administered medications for this visit.     Review of Patient's Allergies indicates:  No Known Allergies  Social  History     Socioeconomic History    Marital status: Single     Spouse name: Not on file    Number of children: Not on file    Years of education: Not on file    Highest education level: Not on file   Occupational History    Not on file   Social Needs    Financial resource strain: Not on file    Food insecurity     Worry: Not on file     Inability: Not on file    Transportation needs     Medical: Not on file     Non-medical: Not on file   Tobacco Use    Smoking status: Never Smoker    Smokeless tobacco: Never Used   Substance and Sexual Activity    Alcohol use: No    Drug use: No    Sexual activity: Yes     Partners: Male     Comment: H/O abnormal pap after her last C/S, S/P cauterization, normal since then.  No H/O STDs.   Lifestyle    Physical activity     Days per week: Not on file     Minutes per session: Not on file    Stress: Not on file   Relationships    Social connections     Talks on phone: Not on file     Gets together: Not on file     Attends religious service: Not on file      Active member of club or organization: Not on file     Attends meetings of clubs or organizations: Not on file     Relationship status: Not on file    Intimate partner violence     Fear of current or ex partner: Not on file     Emotionally abused: Not on file     Physically abused: Not on file     Forced sexual activity: Not on file   Other Topics Concern    Not on file   Social History Narrative    Lives with the father of her third son - and son        2 other sons are in [place] - ages 14, 70 - live with her mom    Ex husband left her - is not supporting 2 sons - told her she was on her own.        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        Lives in [place] with husband and son.     2 kids in [place]--> came to [place] 2016 to visit.        06/24/15:    Lives with sons: 22, 39, 89 yo and boyfriend. Feels safe at home    Works as Land     Past Surgical History:  No date: EXCISION SKIN ABD INFRAUMBILICAL PANNICULECTOMY  02/2016: GASTRIC BYPASS; N/A      Comment:  [facility]   No date: OB ANTEPARTUM CARE CESAREAN DLVR & POSTPARTUM      Comment:  c/s x 3  No date: TUBAL LIGATION  Review of patient's family history indicates:  Problem: Hypertension      Relation: Mother          Age of Onset: (Not Specified)  Problem: Glaucoma      Relation: Mother          Age of Onset: (Not Specified)  Problem: Diabetes      Relation: Father          Age of Onset: (Not Specified)          Comment: died of diabetes, also cholesterol  Problem: Lipids      Relation: Mother          Age of Onset: (Not Specified)  Problem: Diabetes      Relation: Mother          Age of Onset: (Not Specified)  Problem: Thyroid      Relation: Maternal Aunt          Age of Onset: (Not Specified)          Comment: multiple aunts/uncles  Problem: Glaucoma      Relation: Maternal Aunt          Age of Onset: (Not Specified)  Problem: Stroke      Relation: Maternal Grandmother          Age of Onset: (Not  Specified)          Comment: died of stroke  Problem: Heart      Relation: Maternal Uncle          Age of Onset: 64          Comment: MI  Problem: Glaucoma      Relation: Maternal Uncle          Age of Onset: (Not Specified)  Problem: Glaucoma      Relation: Maternal Grandfather          Age of Onset: (Not Specified)  Problem: Cancer - Other      Relation: FamHxNeg          Age of Onset: (Not Specified)           Objective     All ROS reviewed and discussed and are otherwise negative unless listed above.       PHYSICAL EXAM:  Pt sounds comfortable on phone, speaking in full sentences without cough, sob or wheezing. Normal affect.         A/P:  1. Moderate episode of recurrent major depressive disorder (HCC)  Stable, continue, refilled  - traZODone (DESYREL) 50 MG tablet; Take 1 tablet by mouth nightly  Dispense: 30 tablet; Refill: 2  - FLUoxetine (PROZAC) 20 MG capsule; Take 1 capsule by mouth daily  Dispense: 30 capsule; Refill: 5  - FLUoxetine (PROZAC) 40 MG capsule; Take 1 capsule by mouth daily  Dispense: 30 capsule; Refill: 5    2. GAD (generalized anxiety disorder)  Stable, continue, refilled  - traZODone (DESYREL) 50 MG tablet; Take 1 tablet by mouth nightly  Dispense: 30 tablet; Refill: 2  - FLUoxetine (PROZAC) 20 MG capsule; Take 1 capsule by mouth daily  Dispense: 30 capsule; Refill: 5  - FLUoxetine (PROZAC) 40 MG capsule; Take 1 capsule by mouth daily  Dispense: 30 capsule; Refill: 5    3. Post traumatic stress disorder (PTSD)  Stable, continue, refilled  - FLUoxetine (PROZAC) 20 MG capsule; Take 1 capsule by mouth daily  Dispense: 30 capsule; Refill: 5  - FLUoxetine (PROZAC) 40 MG capsule; Take 1 capsule by mouth daily  Dispense: 30 capsule; Refill: 5    4. History of bariatric surgery  - Multiple Vitamin (MULTIVITAMIN) TABS; Take 1 tablet by mouth daily  Dispense: 30 tablet; Refill: 11    5. Vitamin D deficiency  Pt to f/u with pharmacy for refill  - VITAMIN D,25 HYDROXY; Future  - cholecalciferol  (  VITAMIN D3) 2000 UNIT TABS tablet; Take 1 tablet by mouth daily Start taking this after you have finished the 8 week course of weekly high dose vitamin D.  Dispense: 30 tablet; Refill: 11    6. 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 On b12 and iron supplement, may need infusions.  Routing to team to schedule lab visit, refilled supplement, anticipate likely hem referral  - VITAMIN B12; Future  - IRON; Future  - US PELVIC NON-OB LIMITED; Future  - ferrous sulfate 325 (65 FE) MG EC tablet; Take 1 tablet by mouth 3 (three) times daily with meals  Dispense: 90 tablet; Refill: 11    7. Menorrhagia with irregular cycle  Will r/o fibroids, contributing to anemia  - THYROID SCREEN TSH REFLEX FT4; Future  - US PELVIC NON-OB LIMITED; Future      Screening for possible COVID:    1. In the past 14 days, has the patient had new or worsening (for chronic symptoms) fever, cough, shortness of breath, body aches, runny nose, sore throat, nausea, diarrhea, or lack of smell/taste? No   2. In the past 14 days, has the patient had COVID or suspected COVID?  No      I have reviewed the past medical, surgical, social and family history and updated these sections of EpicCare as relevant. All interim labs, test results, and consult notes were reviewed and discussed with patient. Medications were reconciled during this visit and a current medication list was given to the patient at the end of the visit.      follow up prn, as above    Janeal Holmes, PA-C

## 2019-04-02 ENCOUNTER — Encounter (HOSPITAL_BASED_OUTPATIENT_CLINIC_OR_DEPARTMENT_OTHER): Payer: Self-pay

## 2019-04-02 ENCOUNTER — Ambulatory Visit: Payer: No Typology Code available for payment source

## 2019-04-02 DIAGNOSIS — F331 Major depressive disorder, recurrent, moderate: Secondary | ICD-10-CM | POA: Diagnosis present

## 2019-04-02 NOTE — Progress Notes (Signed)
ADULT OUTPATIENT PSYCHIATRY (OPD and PCBHI)  THERAPY FOLLOW UP       CHIEF COMPLAINT: "Everything is upside down. My son's girlfriend [place] is totally crazy, bipolar and drug-involved. She had a baby 9 months ago and abandoned the baby. She's been living w me since June. My son does not have custody. I am terrified that she will com back for the baby. She said she would abandon the baby on the street but my son talked her into letting the baby live w [place]. My husband is working v little - we don't have money for a lawyer."  Baby girl born 06/20/19.  SW suggested that they call [employer] and try to get pro-bono lawyer.  Also suggested that son go to court and determine paternity.  Back a few months ago [place] showed up and said she wanted the baby. Pt was extremely upset and she feels the baby is at extreme risk if the mother takes her.    INTERVAL HISTORY:   Last contact June 2020.  Baby' pediatrician suggested that pt re-engage in therapy  Husband had no work for 4 months  Behind on bills  Re-started psych meds a few weeks ago    CURRENT MEDICATIONS:    Current Outpatient Medications   Medication Sig    ferrous sulfate 325 (65 FE) MG EC tablet Take 1 tablet by mouth 3 (three) times daily with meals    traZODone (DESYREL) 50 MG tablet Take 1 tablet by mouth nightly    FLUoxetine (PROZAC) 20 MG capsule Take 1 capsule by mouth daily    FLUoxetine (PROZAC) 40 MG capsule Take 1 capsule by mouth daily    Multiple Vitamin (MULTIVITAMIN) TABS Take 1 tablet by mouth daily    cholecalciferol (VITAMIN D3) 2000 UNIT TABS tablet Take 1 tablet by mouth daily Start taking this after you have finished the 8 week course of weekly high dose vitamin D.    ergocalciferol (VITAMIN D2) 50000 UNIT capsule Take 1 capsule by mouth once a week    MAGNESIUM CITRATE PO Take by mouth    Cyanocobalamin (B-12 PO) Take by mouth     No current facility-administered medications for this visit.          LANGUAGE OF CARE:    [place]      LANGUAGE NEEDS MET:    Provider/Staff Proficient in Patients Language (not [place])    CURRENT TREATMENT/CONTACT INFO FOR OTHER AGENCIES AND MENTAL HEALTH PROVIDERS (if applicable):    Contact/Community Support - 04/02/19 0937        Family Contact/Supports    Family Member Relationship to Patient  Spouse     Community Support  OP Therapist;Primary Care Provider        Contact/Community Support    Contacts/Community Support  Therapist;PCP     Substance Abuse Treatments/Supports  None Reported        Therapist    Therapist Name  Leavy Cella, LICSW           COLUMBIA RISK ASSESSMENTS:   Suicide:  C-SSRS OP Last Contact Screener - 04/02/19 0950        C-SSRS OP Last Contact Screener    Have you wished you were dead or wished you could go to sleep and not wake up?  No     Have you actually had any thoughts of killing yourself?   No     Have you done anything, started to do anything, or prepared to  do anything to end your life?  No     C-SSRS OP Last Contact Screener Risk Score  No Risk         C-SSRS Risk Assessment - 04/02/19 0948        C-SSRS Risk Assessment    Protective Factors (Recent)  Responsibility to family or others, living with family           VIOLENCE:   Violence/Abuse Risk    No documentation.            ADDITIONAL SCREENINGS:   PHQ9 SCREENING 03/14/2018 06/13/2018 11/19/2018   Patient refused PHQ-9 - - No   Little interest or pleasure in doing things Not at all Not at all Several Days   Feeling down, depressed, or hopeless More than Half the Days Not at all Several Days   Trouble falling asleep, staying asleep, or sleeping too much Nearly Every Day - Nearly Every Day   Feeling tired or having low energy Several Days - Nearly Every Day   Poor appetite or overeating Several Days - Nearly Every Day   Feeling bad about yourself - or that you are a failure or have let yourself or your family down Not at all - Several Days   Trouble concentrating on things, such as reading the newspaper or  watching television Several Days - Nearly Every Day   Moving or speaking slowly that other people could have noticed.  Or the opposite - being so fidgety or restless that you have been  moving around a lot more than ususal Not at all - Not at all   Thoughts that you would be better off dead or of hurting yourself in some way Not at all - Several Days   TOTAL 8 - 16   If you checked off any problems, how difficult have these problems made it for you to do your work, take care of things at home, or get along with people? Somewhat difficult - Somewhat difficult     GAD-7 FLOWSHEET 06/13/2018 05/09/2017 04/13/2017   Patient Refused? - - -   Feeling Nervous/Anxious/ On Edge 1 0 3   Unable to Stop Worrying 0 0 3   Worrying Too Much about Different Things - 3 3   Trouble Relaxing - 0 3   Restless/Hard to Sit Still - 2 0   Easily Annoyed/Irritable - 0 2   Afraid As is Something Aweful - 3 2   How difficult has it made you to do work, take care of things at home, or get along with other people - Somewhat difficult Very difficult   GAD-7 Score - 8 16     SDOH LAST 3 VALUES 07/06/2016   Patient refused CONNECT-S Questionnaire? No   What is your housing situation today? I have housing today, but I am worried about losing housing in the future.   In the last 12 months we worried that our food would run out before we got money to buy more. Never True   In the last 12 months the food we bought didn't last and we didn't have money to get more. Never True   In the last 12 months has the electric, gas or oil company threatened to shut off services in your home? No   In the last 12 months have you or your family ever had trouble getting transportation to medical appointments? No   Within the past 12 months did you skip medications to save money? No  Are you unemployed and looking for work? Yes   Can we refer you to free or low cost community programs (like food pantries) by sharing your name, phone, and address so they can reach you? Yes    Would you like help connecting to resources? (Please check the appropriate answers) (No Data)       MENTAL STATUS EXAMINATION:  Mental Status Exam - 04/02/19 0949        Mental Status    General Appearance  Not able to observe     Behavior  Cooperative     Level of Consciousness  Alert     Orientation Level  Grossly Intact;Oriented x3;Oriented to situation     Attention/Concentration  WNL     Mannerisms/Movements  Not able to observe     Speech Rate  WNL     Speech Clarity  Clear     Speech Tone  Normal vocal inflection     Vocabulary/Fund of Knowledge  WNL     Memory  Intact     Thought Process  Goal-directed;Linear;Logical;Organized     Dissociative Symptoms  None     Thought Content  No abnormalities reported or observed     Hallucinations  None     Suicidal Thoughts  None     Homicidal Thoughts  None     Mood  Anxious     Affect  Congruent w/mood     Judgment  Good      Insight  Good            RISK ASSESSMENTS:       Violence: low (1)     Addiction: low (1)     CURRENT ASSESSMENT:  very stressed financially and emotionally    DIAGNOSES:    No diagnosis found.              REVIEWING TODAY'S VISIT  CLINICAL INTERVENTIONS TODAY: individual therapy    PATIENT'S RESPONSE TO INTERVENTIONS: appreciative    TIME SPENT IN PSYCHOTHERAPY: 45 minute      PLAN: weekly therapy    CARE PLAN/ EPISODES:  Linked Episodes   Type: Episode: Status: Noted: Resolved: Last update: Updated by:   [place] 04/02/2019  04/02/2019  9:52 AM Leavy Cella, LICSW      Comments:           Leavy Cella, LICSW  .

## 2019-04-02 NOTE — BH OP Treatment Plan (Addendum)
Patient/Guardian contributed to the creation of the treatment plan.     Strengths/Skills:  stable family    Potential Barriers:  undocumented, not working    Patient stated goals:  custody of grand daughter    Problem 1:  depressed and anxious about situation with her grand daughter    Short Term Goals:  re-engage in therapy  Short Term Target:   has re-started psych meds  Short TermTarget Date:  3 months  Short Term Goal #1 Progress:  yes     Long Term Goals:  straighten out custody of grand daugher  Long Term Target:   maintain baseline  Long TermTarget Date:   3 months   Long Term Goal #1 Progress:   yes    Intervention #1:  individual therapy  Intervention Frequency:   weekly  Intervention Duration:   3 months  Intervention Responsibility:   Darlys Gales, LICSW

## 2019-04-15 ENCOUNTER — Ambulatory Visit (HOSPITAL_BASED_OUTPATIENT_CLINIC_OR_DEPARTMENT_OTHER): Payer: No Typology Code available for payment source

## 2019-04-18 ENCOUNTER — Other Ambulatory Visit (HOSPITAL_BASED_OUTPATIENT_CLINIC_OR_DEPARTMENT_OTHER): Payer: Self-pay

## 2019-04-24 ENCOUNTER — Ambulatory Visit: Payer: No Typology Code available for payment source

## 2019-04-24 DIAGNOSIS — F331 Major depressive disorder, recurrent, moderate: Secondary | ICD-10-CM

## 2019-04-24 NOTE — Progress Notes (Signed)
ADULT OUTPATIENT PSYCHIATRY (OPD and PCBHI)  THERAPY FOLLOW UP       CHIEF COMPLAINT:   "I would like to get custody of my son's baby. I don't trust the mother at all. She is bipolar and uses drugs. I told my son to contact [facility] to get legal help for custody."  Does not know where mother of child is    Emotional eating. Was 135, now 140  Had "tummy tuck". Still wants to have plastic surgery to reduce skin on thighs and upper arms.    "I am in a low energy state. I am not taking care of myself. I know I should exercise but I'm always too tired form taking care of the baby."  Agrees to take daily walk outside w baby.    INTERVAL HISTORY:   Missed last appt    CURRENT MEDICATIONS:    Current Outpatient Medications   Medication Sig    ferrous sulfate 325 (65 FE) MG EC tablet Take 1 tablet by mouth 3 (three) times daily with meals    traZODone (DESYREL) 50 MG tablet Take 1 tablet by mouth nightly    FLUoxetine (PROZAC) 20 MG capsule Take 1 capsule by mouth daily    FLUoxetine (PROZAC) 40 MG capsule Take 1 capsule by mouth daily    Multiple Vitamin (MULTIVITAMIN) TABS Take 1 tablet by mouth daily    cholecalciferol (VITAMIN D3) 2000 UNIT TABS tablet Take 1 tablet by mouth daily Start taking this after you have finished the 8 week course of weekly high dose vitamin D.    ergocalciferol (VITAMIN D2) 50000 UNIT capsule Take 1 capsule by mouth once a week    MAGNESIUM CITRATE PO Take by mouth    Cyanocobalamin (B-12 PO) Take by mouth     No current facility-administered medications for this visit.          LANGUAGE OF CARE:    [place]      LANGUAGE NEEDS MET:    Provider/Staff Proficient in Patients Language (not [place])    CURRENT TREATMENT/CONTACT INFO FOR OTHER AGENCIES AND MENTAL HEALTH PROVIDERS (if applicable):    Contact/Community Support - 04/24/19 1025        Family Contact/Supports    Family Member Relationship to Patient  Spouse     Community Support  OP Therapist;Primary Care Provider         Contact/Community Support    Contacts/Community Support  Therapist;PCP     Substance Abuse Treatments/Supports  None Reported           COLUMBIA RISK ASSESSMENTS:   Suicide:  C-SSRS OP Last Contact Screener - 04/24/19 1028        C-SSRS OP Last Contact Screener    Have you wished you were dead or wished you could go to sleep and not wake up?  No     Have you actually had any thoughts of killing yourself?   No     Have you done anything, started to do anything, or prepared to do anything to end your life?  No     C-SSRS OP Last Contact Screener Risk Score  No Risk         C-SSRS Risk Assessment - 04/24/19 1028        C-SSRS Risk Assessment    Protective Factors (Recent)  Identifies reasons for living;Responsibility to family or others, living with family;Belief that suicide is immoral, high spirituality     Other Protective Factors  her grand  daughter           VIOLENCE:   Violence/Abuse Risk - 04/24/19 1027        Violence Risk    History of Violence?  No     Current Attempt to Harm  No     Homicidal Ideation?  No     Homicidal Ideation With Intent?  No     Access to Weapons  Denies     Self Destruction Behaviors  Denies     Aggression/Poor Impulse Control?  Denies     Medical Conditions Restraint Risk  No     Abuse History Restraint Risk  No        Domestic Abuse Assessment    * Do you feel safe at home?  Yes     * Do You Feel Unsafe in Your Relationship(s)?  No     Other Persons at Risk  None     Abuse/Trauma History  None     Abuse/Trauma (Current)  None     Restraining Order?  Not now and no past history              ADDITIONAL SCREENINGS:   PHQ9 SCREENING 03/14/2018 06/13/2018 11/19/2018   Patient refused PHQ-9 - - No   Little interest or pleasure in doing things Not at all Not at all Several Days   Feeling down, depressed, or hopeless More than Half the Days Not at all Several Days   Trouble falling asleep, staying asleep, or sleeping too much Nearly Every Day - Nearly Every Day   Feeling tired or having low  energy Several Days - Nearly Every Day   Poor appetite or overeating Several Days - Nearly Every Day   Feeling bad about yourself - or that you are a failure or have let yourself or your family down Not at all - Several Days   Trouble concentrating on things, such as school work, reading the newspaper, or watching television Several Days - Nearly Every Day   Moving or speaking slowly that other people could have noticed.  Or the opposite - being so fidgety or restless that you have been  moving around a lot more than ususal Not at all - Not at all   Thoughts that you would be better off dead or of hurting yourself in some way Not at all - Several Days   TOTAL 8 - 16   If you checked off any problems, how difficult have these problems made it for you to do your work, take care of things at home, or get along with people? Somewhat difficult - Somewhat difficult     GAD-7 FLOWSHEET 06/13/2018 05/09/2017 04/13/2017   Patient Refused? - - -   Feeling Nervous/Anxious/ On Edge 1 0 3   Unable to Stop Worrying 0 0 3   Worrying Too Much about Different Things - 3 3   Trouble Relaxing - 0 3   Restless/Hard to Sit Still - 2 0   Easily Annoyed/Irritable - 0 2   Afraid As is Something Aweful - 3 2   How difficult has it made you to do work, take care of things at home, or get along with other people - Somewhat difficult Very difficult   GAD-7 Score - 8 16     SDOH LAST 3 VALUES 07/06/2016   Patient refused CONNECT-S Questionnaire? No   What is your housing situation today? I have housing today, but I am worried about losing housing in  the future.   In the last 12 months we worried that our food would run out before we got money to buy more. Never True   In the last 12 months the food we bought didn't last and we didn't have money to get more. Never True   In the last 12 months has the electric, gas or oil company threatened to shut off services in your home? No   In the last 12 months have you or your family ever had trouble getting  transportation to medical appointments? No   Within the past 12 months did you skip medications to save money? No   Are you unemployed and looking for work? Yes   Can we refer you to free or low cost community programs (like food pantries) by sharing your name, phone, and address so they can reach you? Yes   Would you like help connecting to resources? (Please check the appropriate answers) (No Data)       MENTAL STATUS EXAMINATION:  Mental Status Exam - 04/24/19 1025        Mental Status    General Appearance  Not able to observe     Behavior  Cooperative     Level of Consciousness  Not able to observe     Orientation Level  Grossly Intact;Oriented x3;Oriented to situation     Attention/Concentration  Not able to observe     Mannerisms/Movements  Not able to observe     Speech Rate  WNL     Speech Clarity  Clear     Speech Tone  Normal vocal inflection     Vocabulary/Fund of Knowledge  WNL     Memory  Intact     Thought Process  Goal-directed;Linear;Logical;Organized     Dissociative Symptoms  None     Thought Content  No abnormalities reported or observed     Hallucinations  None     Suicidal Thoughts  None     Homicidal Thoughts  None     Mood  Euthymic     Judgment  Good      Insight  Good            RISK ASSESSMENTS:       Violence: low (1)     Addiction: low (1)     CURRENT ASSESSMENT:    Worried about 10 month grand daughter's custody situation.    DIAGNOSES:     Major depressive disorder, recurrent episode, moderate (HCC)   SNOMED CT(R): RECURRENT MAJOR DEPRESSIVE EPISODES, MODERATE                REVIEWING TODAY'S VISIT  CLINICAL INTERVENTIONS TODAY:   Individual therapy    PATIENT'S RESPONSE TO INTERVENTIONS:   appreciative    TIME SPENT IN PSYCHOTHERAPY: 45 minutes      PLAN: biweekly therapy    CARE PLAN/ EPISODES:  Linked Episodes   Type: Episode: Status: Noted: Resolved: Last update: Updated by:   [place] Active 04/02/2019  04/24/2019 10:16 AM Leavy Cella, LICSW       Comments:           Leavy Cella, LICSW  .

## 2019-05-01 ENCOUNTER — Ambulatory Visit: Payer: No Typology Code available for payment source

## 2019-05-01 DIAGNOSIS — F331 Major depressive disorder, recurrent, moderate: Secondary | ICD-10-CM | POA: Diagnosis present

## 2019-05-01 NOTE — Progress Notes (Signed)
ADULT OUTPATIENT PSYCHIATRY (OPD and PCBHI)  THERAPY FOLLOW UP       CHIEF COMPLAINT:   "I spoke with my son that he has to take responsibility for the baby. My husband and I spoke with him. We said that he has take the baby at least 2 nights on the weekend."  Son agreed.    "I tried to walk outside but it's too cold. I'm more sensitive to cold since bariatric surgery. But I've started to walk inside the house. It feels good to move again."    Compulsive urge to eat.  Identifies it's due to stress  Had wanted to make jewelry but taking care of grand daughter    Younger son is almost 56  37 yo dau moved out. Dating a girl. V difficult for pt to accept.    INTERVAL HISTORY:   Last visit 2 weeks ago    CURRENT MEDICATIONS:    Current Outpatient Medications   Medication Sig    ferrous sulfate 325 (65 FE) MG EC tablet Take 1 tablet by mouth 3 (three) times daily with meals    traZODone (DESYREL) 50 MG tablet Take 1 tablet by mouth nightly    FLUoxetine (PROZAC) 20 MG capsule Take 1 capsule by mouth daily    FLUoxetine (PROZAC) 40 MG capsule Take 1 capsule by mouth daily    Multiple Vitamin (MULTIVITAMIN) TABS Take 1 tablet by mouth daily    cholecalciferol (VITAMIN D3) 2000 UNIT TABS tablet Take 1 tablet by mouth daily Start taking this after you have finished the 8 week course of weekly high dose vitamin D.    ergocalciferol (VITAMIN D2) 50000 UNIT capsule Take 1 capsule by mouth once a week    MAGNESIUM CITRATE PO Take by mouth    Cyanocobalamin (B-12 PO) Take by mouth     No current facility-administered medications for this visit.          LANGUAGE OF CARE:    [place]      LANGUAGE NEEDS MET:    Provider/Staff Proficient in Patients Language (not [place])    CURRENT TREATMENT/CONTACT INFO FOR OTHER AGENCIES AND MENTAL HEALTH PROVIDERS (if applicable):    Contact/Community Support - 05/01/19 1010        Family Contact/Supports    Family Member Relationship to Patient  Spouse     Community  Support  OP Therapist;Primary Care Provider        Contact/Community Support    Contacts/Community Support  Therapist;PCP     Substance Abuse Treatments/Supports  None Reported           COLUMBIA RISK ASSESSMENTS:   Suicide:  C-SSRS OP Last Contact Screener - 05/01/19 1011        C-SSRS OP Last Contact Screener    Have you wished you were dead or wished you could go to sleep and not wake up?  No     Have you actually had any thoughts of killing yourself?   No     Have you done anything, started to do anything, or prepared to do anything to end your life?  No     C-SSRS OP Last Contact Screener Risk Score  No Risk         C-SSRS Risk Assessment - 05/01/19 1011        C-SSRS Risk Assessment    Treatment History  Previous psychiatric diagnoses and treatments     Protective Factors (Recent)  Identifies reasons for living;Responsibility to family  or others, living with family;Supportive social network or family;Belief that suicide is immoral, high spirituality     Other Protective Factors  religious, feels responsible for her kids     Describe any suicidal, self-injurious or aggressive behavior (include dates)  no SI/HI identified           VIOLENCE:   Violence/Abuse Risk - 05/01/19 1012        Violence Risk    History of Violence?  No     Current Attempt to Harm  No     Homicidal Ideation?  No     Homicidal Ideation With Intent?  No     Access to Weapons  Denies     Self Destruction Behaviors  Denies     Self Inflicted Injury?  Denies     Aggression/Poor Impulse Control?  Denies     Medical Conditions Restraint Risk  No     Abuse History Restraint Risk  No        Domestic Abuse Assessment    * Do you feel safe at home?  Yes     * Do You Feel Unsafe in Your Relationship(s)?  No     Other Persons at Risk  None     Abuse/Trauma History  None     Abuse/Trauma (Current)  None     Restraining Order?  Not now and no past history              ADDITIONAL SCREENINGS:   PHQ9 SCREENING 03/14/2018 06/13/2018 11/19/2018   Patient  refused PHQ-9 - - No   Little interest or pleasure in doing things Not at all Not at all Several Days   Feeling down, depressed, or hopeless More than Half the Days Not at all Several Days   Trouble falling asleep, staying asleep, or sleeping too much Nearly Every Day - Nearly Every Day   Feeling tired or having low energy Several Days - Nearly Every Day   Poor appetite or overeating Several Days - Nearly Every Day   Feeling bad about yourself - or that you are a failure or have let yourself or your family down Not at all - Several Days   Trouble concentrating on things, such as school work, reading the newspaper, or watching television Several Days - Nearly Every Day   Moving or speaking slowly that other people could have noticed.  Or the opposite - being so fidgety or restless that you have been  moving around a lot more than ususal Not at all - Not at all   Thoughts that you would be better off dead or of hurting yourself in some way Not at all - Several Days   TOTAL 8 - 16   If you checked off any problems, how difficult have these problems made it for you to do your work, take care of things at home, or get along with people? Somewhat difficult - Somewhat difficult     GAD-7 FLOWSHEET 06/13/2018 05/09/2017 04/13/2017   Patient Refused? - - -   Feeling Nervous/Anxious/ On Edge 1 0 3   Unable to Stop Worrying 0 0 3   Worrying Too Much about Different Things - 3 3   Trouble Relaxing - 0 3   Restless/Hard to Sit Still - 2 0   Easily Annoyed/Irritable - 0 2   Afraid As is Something Aweful - 3 2   How difficult has it made you to do work, take care of things at home,  or get along with other people - Somewhat difficult Very difficult   GAD-7 Score - 8 16     SDOH LAST 3 VALUES 07/06/2016   Patient refused CONNECT-S Questionnaire? No   What is your housing situation today? I have housing today, but I am worried about losing housing in the future.   In the last 12 months we worried that our food would run out before we got  money to buy more. Never True   In the last 12 months the food we bought didn't last and we didn't have money to get more. Never True   In the last 12 months has the electric, gas or oil company threatened to shut off services in your home? No   In the last 12 months have you or your family ever had trouble getting transportation to medical appointments? No   Within the past 12 months did you skip medications to save money? No   Are you unemployed and looking for work? Yes   Can we refer you to free or low cost community programs (like food pantries) by sharing your name, phone, and address so they can reach you? Yes   Would you like help connecting to resources? (Please check the appropriate answers) (No Data)       MENTAL STATUS EXAMINATION:  Mental Status Exam - 05/01/19 1013        Mental Status    General Appearance  Not able to observe     Behavior  Cooperative     Level of Consciousness  Not able to observe     Orientation Level  Oriented x3;Oriented to situation     Attention/Concentration  WNL     Mannerisms/Movements  Not able to observe     Speech Rate  WNL     Speech Clarity  Clear     Speech Tone  Normal vocal inflection     Vocabulary/Fund of Knowledge  WNL     Memory  Intact     Thought Process  Goal-directed;Linear;Logical;Organized     Dissociative Symptoms  None     Thought Content  No abnormalities reported or observed     Hallucinations  None     Suicidal Thoughts  None     Homicidal Thoughts  None     Mood  Euphoric     Judgment  Good      Insight  Good            RISK ASSESSMENTS:       Violence: low (1)     Addiction: low (1)     CURRENT ASSESSMENT:    Making changes - putting limits on son  Starting to exercise    DIAGNOSES:    No diagnosis found.              REVIEWING TODAY'S VISIT  CLINICAL INTERVENTIONS TODAY: individual therapy    PATIENT'S RESPONSE TO INTERVENTIONS:   appreiative    TIME SPENT IN PSYCHOTHERAPY: 45 minutes      PLAN: biweekly therapy    CARE PLAN/ EPISODES:  Linked  Episodes   Type: Episode: Status: Noted: Resolved: Last update: Updated by:   [place] 04/02/2019  05/01/2019 10:08 AM Leavy Cella, LICSW      Comments:           Leavy Cella, LICSW.  Marland Kitchen

## 2019-05-08 ENCOUNTER — Ambulatory Visit: Payer: No Typology Code available for payment source

## 2019-05-08 DIAGNOSIS — F331 Major depressive disorder, recurrent, moderate: Secondary | ICD-10-CM | POA: Insufficient documentation
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
## 2019-05-09 ENCOUNTER — Other Ambulatory Visit (HOSPITAL_BASED_OUTPATIENT_CLINIC_OR_DEPARTMENT_OTHER): Payer: Self-pay
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
## 2019-05-14 ENCOUNTER — Other Ambulatory Visit (HOSPITAL_BASED_OUTPATIENT_CLINIC_OR_DEPARTMENT_OTHER): Payer: Self-pay
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
## 2019-05-17 ENCOUNTER — Other Ambulatory Visit (HOSPITAL_BASED_OUTPATIENT_CLINIC_OR_DEPARTMENT_OTHER): Payer: Self-pay
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
## 2019-06-03 ENCOUNTER — Ambulatory Visit: Payer: No Typology Code available for payment source

## 2019-06-03 DIAGNOSIS — F331 Major depressive disorder, recurrent, moderate: Secondary | ICD-10-CM | POA: Insufficient documentation

## 2019-06-03 MED FILL — FLUOXETINE   20MG: 30 days supply | Qty: 30 | Fill #1

## 2019-06-03 MED FILL — DAILY-VITE: 30 days supply | Qty: 30 | Fill #1

## 2019-06-03 MED FILL — VITAMIN D 50MCG (2000U): 30 days supply | Qty: 30 | Fill #1

## 2019-06-03 MED FILL — TRAZODONE 50MG: 30 days supply | Qty: 30 | Fill #1

## 2019-06-03 MED FILL — FLUOXETINE 40MG: 30 days supply | Qty: 30 | Fill #1

## 2019-06-03 MED FILL — *FERROUS SULF 325MG EC: 30 days supply | Qty: 90 | Fill #1

## 2019-06-03 NOTE — Progress Notes (Signed)
ADULT OUTPATIENT PSYCHIATRY (OPD and PCBHI)  THERAPY FOLLOW UP       CHIEF COMPLAINT:   "I ran out of meds but for whatever reason I have not called to get refills."  SW encouraged her to call - husband can pick up tomorrow.    [place] daughter is doing well. Husband working more.     "I am very upset about weight gain. I am too anxious to get on the scale."  Weighs 144 - want to be 130.  Bariatric at [facility]. Was having consults 1 time per year. Didn't get her annual visit.  Needs to reconnect w providers at [facility]. SW will ask PCP office to help. Needs nutritionist, endocrine and surgeon.    "It's very hard to see you go."      INTERVAL HISTORY:   Last visit 2 weeks ago    CURRENT MEDICATIONS:    Current Outpatient Medications   Medication Sig    ferrous sulfate 325 (65 FE) MG EC tablet Take 1 tablet by mouth 3 (three) times daily with meals    traZODone (DESYREL) 50 MG tablet Take 1 tablet by mouth nightly    FLUoxetine (PROZAC) 20 MG capsule Take 1 capsule by mouth daily    FLUoxetine (PROZAC) 40 MG capsule Take 1 capsule by mouth daily    Multiple Vitamin (MULTIVITAMIN) TABS Take 1 tablet by mouth daily    cholecalciferol (VITAMIN D3) 2000 UNIT TABS tablet Take 1 tablet by mouth daily Start taking this after you have finished the 8 week course of weekly high dose vitamin D.    ergocalciferol (VITAMIN D2) 50000 UNIT capsule Take 1 capsule by mouth once a week    MAGNESIUM CITRATE PO Take by mouth    Cyanocobalamin (B-12 PO) Take by mouth     No current facility-administered medications for this visit.          LANGUAGE OF CARE:    [place]      LANGUAGE NEEDS MET:    Provider/Staff Proficient in Patients Language (not [place])    CURRENT TREATMENT/CONTACT INFO FOR OTHER AGENCIES AND MENTAL HEALTH PROVIDERS (if applicable):    Contact/Community Support - 05/01/19 1010        Family Contact/Supports    Family Member Relationship to Patient  Spouse     Community Support  OP  Therapist;Primary Care Provider        Contact/Community Support    Contacts/Community Support  Therapist;PCP     Substance Abuse Treatments/Supports  None Reported           COLUMBIA RISK ASSESSMENTS:   Suicide:  C-SSRS OP Last Contact Screener - 05/01/19 1011        C-SSRS OP Last Contact Screener    Have you wished you were dead or wished you could go to sleep and not wake up?  No     Have you actually had any thoughts of killing yourself?   No     Have you done anything, started to do anything, or prepared to do anything to end your life?  No     C-SSRS OP Last Contact Screener Risk Score  No Risk         C-SSRS Risk Assessment - 05/01/19 1011        C-SSRS Risk Assessment    Treatment History  Previous psychiatric diagnoses and treatments     Protective Factors (Recent)  Identifies reasons for living;Responsibility to family or others, living with  family;Supportive social network or family;Belief that suicide is immoral, high spirituality     Other Protective Factors  religious, feels responsible for her kids     Describe any suicidal, self-injurious or aggressive behavior (include dates)  no SI/HI identified           VIOLENCE:   Violence/Abuse Risk - 05/01/19 1012        Violence Risk    History of Violence?  No     Current Attempt to Harm  No     Homicidal Ideation?  No     Homicidal Ideation With Intent?  No     Access to Weapons  Denies     Self Destruction Behaviors  Denies     Self Inflicted Injury?  Denies     Aggression/Poor Impulse Control?  Denies     Medical Conditions Restraint Risk  No     Abuse History Restraint Risk  No        Domestic Abuse Assessment    * Do you feel safe at home?  Yes     * Do You Feel Unsafe in Your Relationship(s)?  No     Other Persons at Risk  None     Abuse/Trauma History  None     Abuse/Trauma (Current)  None     Restraining Order?  Not now and no past history              ADDITIONAL SCREENINGS:   PHQ9 SCREENING 03/14/2018 06/13/2018 11/19/2018   Patient refused PHQ-9 -  - No   Little interest or pleasure in doing things Not at all Not at all Several Days   Feeling down, depressed, or hopeless More than Half the Days Not at all Several Days   Trouble falling asleep, staying asleep, or sleeping too much Nearly Every Day - Nearly Every Day   Feeling tired or having low energy Several Days - Nearly Every Day   Poor appetite or overeating Several Days - Nearly Every Day   Feeling bad about yourself - or that you are a failure or have let yourself or your family down Not at all - Several Days   Trouble concentrating on things, such as school work, reading the newspaper, or watching television Several Days - Nearly Every Day   Moving or speaking slowly that other people could have noticed.  Or the opposite - being so fidgety or restless that you have been  moving around a lot more than ususal Not at all - Not at all   Thoughts that you would be better off dead or of hurting yourself in some way Not at all - Several Days   TOTAL 8 - 16   If you checked off any problems, how difficult have these problems made it for you to do your work, take care of things at home, or get along with people? Somewhat difficult - Somewhat difficult     GAD-7 FLOWSHEET 06/13/2018 05/09/2017 04/13/2017   Patient Refused? - - -   Feeling Nervous/Anxious/ On Edge 1 0 3   Unable to Stop Worrying 0 0 3   Worrying Too Much about Different Things - 3 3   Trouble Relaxing - 0 3   Restless/Hard to Sit Still - 2 0   Easily Annoyed/Irritable - 0 2   Afraid As is Something Aweful - 3 2   How difficult has it made you to do work, take care of things at home, or get along with  other people - Somewhat difficult Very difficult   GAD-7 Score - 8 16     SDOH LAST 3 VALUES 07/06/2016   Patient refused CONNECT-S Questionnaire? No   What is your housing situation today? I have housing today, but I am worried about losing housing in the future.   In the last 12 months we worried that our food would run out before we got money to buy  more. Never True   In the last 12 months the food we bought didn't last and we didn't have money to get more. Never True   In the last 12 months has the electric, gas or oil company threatened to shut off services in your home? No   In the last 12 months have you or your family ever had trouble getting transportation to medical appointments? No   Within the past 12 months did you skip medications to save money? No   Are you unemployed and looking for work? Yes   Can we refer you to free or low cost community programs (like food pantries) by sharing your name, phone, and address so they can reach you? Yes   Would you like help connecting to resources? (Please check the appropriate answers) (No Data)       MENTAL STATUS EXAMINATION:  Mental Status Exam - 05/01/19 1013        Mental Status    General Appearance  Not able to observe     Behavior  Cooperative     Level of Consciousness  Not able to observe     Orientation Level  Oriented x3;Oriented to situation     Attention/Concentration  WNL     Mannerisms/Movements  Not able to observe     Speech Rate  WNL     Speech Clarity  Clear     Speech Tone  Normal vocal inflection     Vocabulary/Fund of Knowledge  WNL     Memory  Intact     Thought Process  Goal-directed;Linear;Logical;Organized     Dissociative Symptoms  None     Thought Content  No abnormalities reported or observed     Hallucinations  None     Suicidal Thoughts  None     Homicidal Thoughts  None     Mood  Euphoric     Judgment  Good      Insight  Good            RISK ASSESSMENTS:       Violence: low (1)     Addiction: low (1)     CURRENT ASSESSMENT:    Making changes - putting limits on son  Starting to exercise    DIAGNOSES:    No diagnosis found.              REVIEWING TODAY'S VISIT  CLINICAL INTERVENTIONS TODAY: individual therapy    PATIENT'S RESPONSE TO INTERVENTIONS:   appreiative    TIME SPENT IN PSYCHOTHERAPY: 45 minutes      PLAN: biweekly therapy    CARE PLAN/ EPISODES:  Linked Episodes   Type:  Episode: Status: Noted: Resolved: Last update: Updated by:   [place] 04/02/2019  05/01/2019 10:08 AM Leavy Cella, LICSW      Comments:           Leavy Cella, LICSW

## 2019-06-04 ENCOUNTER — Encounter (HOSPITAL_BASED_OUTPATIENT_CLINIC_OR_DEPARTMENT_OTHER): Payer: Self-pay

## 2019-06-06 ENCOUNTER — Encounter (HOSPITAL_BASED_OUTPATIENT_CLINIC_OR_DEPARTMENT_OTHER): Payer: Self-pay

## 2019-06-17 ENCOUNTER — Telehealth (HOSPITAL_BASED_OUTPATIENT_CLINIC_OR_DEPARTMENT_OTHER): Payer: No Typology Code available for payment source

## 2019-07-01 ENCOUNTER — Ambulatory Visit: Payer: No Typology Code available for payment source

## 2019-07-01 DIAGNOSIS — F331 Major depressive disorder, recurrent, moderate: Secondary | ICD-10-CM | POA: Insufficient documentation

## 2019-07-01 NOTE — Progress Notes (Signed)
ADULT OUTPATIENT PSYCHIATRY (OPD and PCBHI)  THERAPY FOLLOW UP       CHIEF COMPLAINT:   Has started to walk more regularly.  Helpful.    Has meds. Taking.    Discussed SW leaving [place].    INTERVAL HISTORY:   Last visit 2 weeks ago    CURRENT MEDICATIONS:    Current Outpatient Medications   Medication Sig    ferrous sulfate 325 (65 FE) MG EC tablet Take 1 tablet by mouth 3 (three) times daily with meals    traZODone (DESYREL) 50 MG tablet Take 1 tablet by mouth nightly    FLUoxetine (PROZAC) 20 MG capsule Take 1 capsule by mouth daily    FLUoxetine (PROZAC) 40 MG capsule Take 1 capsule by mouth daily    Multiple Vitamin (MULTIVITAMIN) TABS Take 1 tablet by mouth daily    cholecalciferol (VITAMIN D3) 2000 UNIT TABS tablet Take 1 tablet by mouth daily Start taking this after you have finished the 8 week course of weekly high dose vitamin D.    ergocalciferol (VITAMIN D2) 50000 UNIT capsule Take 1 capsule by mouth once a week    MAGNESIUM CITRATE PO Take by mouth    Cyanocobalamin (B-12 PO) Take by mouth     No current facility-administered medications for this visit.          LANGUAGE OF CARE:    [place]      LANGUAGE NEEDS MET:    Provider/Staff Proficient in Patients Language (not [place])    CURRENT TREATMENT/CONTACT INFO FOR OTHER AGENCIES AND MENTAL HEALTH PROVIDERS (if applicable):    Contact/Community Support - 05/01/19 1010        Family Contact/Supports    Family Member Relationship to Patient  Spouse     Community Support  OP Therapist;Primary Care Provider        Contact/Community Support    Contacts/Community Support  Therapist;PCP     Substance Abuse Treatments/Supports  None Reported           COLUMBIA RISK ASSESSMENTS:   Suicide:  C-SSRS OP Last Contact Screener - 05/01/19 1011        C-SSRS OP Last Contact Screener    Have you wished you were dead or wished you could go to sleep and not wake up?  No     Have you actually had any thoughts of killing yourself?   No     Have you  done anything, started to do anything, or prepared to do anything to end your life?  No     C-SSRS OP Last Contact Screener Risk Score  No Risk         C-SSRS Risk Assessment - 05/01/19 1011        C-SSRS Risk Assessment    Treatment History  Previous psychiatric diagnoses and treatments     Protective Factors (Recent)  Identifies reasons for living;Responsibility to family or others, living with family;Supportive social network or family;Belief that suicide is immoral, high spirituality     Other Protective Factors  religious, feels responsible for her kids     Describe any suicidal, self-injurious or aggressive behavior (include dates)  no SI/HI identified           VIOLENCE:   Violence/Abuse Risk - 05/01/19 1012        Violence Risk    History of Violence?  No     Current Attempt to Harm  No     Homicidal Ideation?  No  Homicidal Ideation With Intent?  No     Access to Weapons  Denies     Self Destruction Behaviors  Denies     Self Inflicted Injury?  Denies     Aggression/Poor Impulse Control?  Denies     Medical Conditions Restraint Risk  No     Abuse History Restraint Risk  No        Domestic Abuse Assessment    * Do you feel safe at home?  Yes     * Do You Feel Unsafe in Your Relationship(s)?  No     Other Persons at Risk  None     Abuse/Trauma History  None     Abuse/Trauma (Current)  None     Restraining Order?  Not now and no past history              ADDITIONAL SCREENINGS:   PHQ9 SCREENING 03/14/2018 06/13/2018 11/19/2018   Patient refused PHQ-9 - - No   Little interest or pleasure in doing things Not at all Not at all Several Days   Feeling down, depressed, or hopeless More than Half the Days Not at all Several Days   Trouble falling asleep, staying asleep, or sleeping too much Nearly Every Day - Nearly Every Day   Feeling tired or having low energy Several Days - Nearly Every Day   Poor appetite or overeating Several Days - Nearly Every Day   Feeling bad about yourself - or that you are a failure or have  let yourself or your family down Not at all - Several Days   Trouble concentrating on things, such as school work, reading the newspaper, or watching television Several Days - Nearly Every Day   Moving or speaking slowly that other people could have noticed.  Or the opposite - being so fidgety or restless that you have been  moving around a lot more than ususal Not at all - Not at all   Thoughts that you would be better off dead or of hurting yourself in some way Not at all - Several Days   TOTAL 8 - 16   If you checked off any problems, how difficult have these problems made it for you to do your work, take care of things at home, or get along with people? Somewhat difficult - Somewhat difficult     GAD-7 FLOWSHEET 06/13/2018 05/09/2017 04/13/2017   Patient Refused? - - -   Feeling Nervous/Anxious/ On Edge 1 0 3   Unable to Stop Worrying 0 0 3   Worrying Too Much about Different Things - 3 3   Trouble Relaxing - 0 3   Restless/Hard to Sit Still - 2 0   Easily Annoyed/Irritable - 0 2   Afraid As is Something Aweful - 3 2   How difficult has it made you to do work, take care of things at home, or get along with other people - Somewhat difficult Very difficult   GAD-7 Score - 8 16     SDOH LAST 3 VALUES 07/06/2016   Patient refused CONNECT-S Questionnaire? No   What is your housing situation today? I have housing today, but I am worried about losing housing in the future.   In the last 12 months we worried that our food would run out before we got money to buy more. Never True   In the last 12 months the food we bought didn't last and we didn't have money to get more. Never True  In the last 12 months has the electric, gas or oil company threatened to shut off services in your home? No   In the last 12 months have you or your family ever had trouble getting transportation to medical appointments? No   Within the past 12 months did you skip medications to save money? No   Are you unemployed and looking for work? Yes   Can  we refer you to free or low cost community programs (like food pantries) by sharing your name, phone, and address so they can reach you? Yes   Would you like help connecting to resources? (Please check the appropriate answers) (No Data)       MENTAL STATUS EXAMINATION:  Mental Status Exam - 05/01/19 1013        Mental Status    General Appearance  Not able to observe     Behavior  Cooperative     Level of Consciousness  Not able to observe     Orientation Level  Oriented x3;Oriented to situation     Attention/Concentration  WNL     Mannerisms/Movements  Not able to observe     Speech Rate  WNL     Speech Clarity  Clear     Speech Tone  Normal vocal inflection     Vocabulary/Fund of Knowledge  WNL     Memory  Intact     Thought Process  Goal-directed;Linear;Logical;Organized     Dissociative Symptoms  None     Thought Content  No abnormalities reported or observed     Hallucinations  None     Suicidal Thoughts  None     Homicidal Thoughts  None     Mood  Euphoric     Judgment  Good      Insight  Good            RISK ASSESSMENTS:       Violence: low (1)     Addiction: low (1)     CURRENT ASSESSMENT:    Making changes - putting limits on son  Starting to exercise    DIAGNOSES:    No diagnosis found.              REVIEWING TODAY'S VISIT  CLINICAL INTERVENTIONS TODAY: individual therapy    PATIENT'S RESPONSE TO INTERVENTIONS:   appreiative    TIME SPENT IN PSYCHOTHERAPY: 45 minutes      PLAN: biweekly therapy    CARE PLAN/ EPISODES:  Linked Episodes   Type: Episode: Status: Noted: Resolved: Last update: Updated by:   [place] 04/02/2019  05/01/2019 10:08 AM Leavy Cella, LICSW      Comments:           Leavy Cella, LICSW.

## 2019-07-15 ENCOUNTER — Ambulatory Visit: Payer: No Typology Code available for payment source

## 2019-07-15 ENCOUNTER — Encounter (HOSPITAL_BASED_OUTPATIENT_CLINIC_OR_DEPARTMENT_OTHER): Payer: Self-pay

## 2019-07-15 DIAGNOSIS — F331 Major depressive disorder, recurrent, moderate: Secondary | ICD-10-CM | POA: Insufficient documentation

## 2019-07-15 NOTE — Progress Notes (Signed)
[facility] TERMINATION AND TRANSFER NOTE    Termination Document    Date treatment started: 10/04/2017    Transfer/termination date: 07/15/19    Reason for treatment:   Initially needed clearance for bariatric surgery    Treatment course (response to medications, compliance):   On meds per PCP    Outstanding Issues (including legal issues):   SW resigning from [facility] - wants a new therapist    Safe to refill:   Per PCP    Risk level: low    Plan: terminate treatment plan

## 2019-07-15 NOTE — Progress Notes (Signed)
ADULT OUTPATIENT PSYCHIATRY (OPD and PCBHI)  THERAPY FOLLOW UP       CHIEF COMPLAINT:   Termination session.  On list for new therapist.  "I am so grateful for what you have done for me. You provided me so much support during my bariatric surgery. And I was able to put limits on my daughter. Now she is living a normal life, working as a Medical illustrator. Without your help, I think this would never have happened"    "Initially I refused to go to therapy but now I tell everyone to go see someone."    "I am worried about my son. He smokes weed around 5 times a day."    INTERVAL HISTORY:   Last visit 2 weeks ago    CURRENT MEDICATIONS:    Current Outpatient Medications   Medication Sig    ferrous sulfate 325 (65 FE) MG EC tablet Take 1 tablet by mouth 3 (three) times daily with meals    traZODone (DESYREL) 50 MG tablet Take 1 tablet by mouth nightly    FLUoxetine (PROZAC) 20 MG capsule Take 1 capsule by mouth daily    FLUoxetine (PROZAC) 40 MG capsule Take 1 capsule by mouth daily    Multiple Vitamin (MULTIVITAMIN) TABS Take 1 tablet by mouth daily    cholecalciferol (VITAMIN D3) 2000 UNIT TABS tablet Take 1 tablet by mouth daily Start taking this after you have finished the 8 week course of weekly high dose vitamin D.    ergocalciferol (VITAMIN D2) 50000 UNIT capsule Take 1 capsule by mouth once a week    MAGNESIUM CITRATE PO Take by mouth    Cyanocobalamin (B-12 PO) Take by mouth     No current facility-administered medications for this visit.          LANGUAGE OF CARE:    [place]      LANGUAGE NEEDS MET:    Provider/Staff Proficient in Patients Language (not [place])    CURRENT TREATMENT/CONTACT INFO FOR OTHER AGENCIES AND MENTAL HEALTH PROVIDERS (if applicable):    Contact/Community Support - 05/01/19 1010        Family Contact/Supports    Family Member Relationship to Patient  Spouse     Community Support  OP Therapist;Primary Care Provider        Contact/Community Support    Contacts/Community  Support  Therapist;PCP     Substance Abuse Treatments/Supports  None Reported           COLUMBIA RISK ASSESSMENTS:   Suicide:  C-SSRS OP Last Contact Screener - 05/01/19 1011        C-SSRS OP Last Contact Screener    Have you wished you were dead or wished you could go to sleep and not wake up?  No     Have you actually had any thoughts of killing yourself?   No     Have you done anything, started to do anything, or prepared to do anything to end your life?  No     C-SSRS OP Last Contact Screener Risk Score  No Risk         C-SSRS Risk Assessment - 05/01/19 1011        C-SSRS Risk Assessment    Treatment History  Previous psychiatric diagnoses and treatments     Protective Factors (Recent)  Identifies reasons for living;Responsibility to family or others, living with family;Supportive social network or family;Belief that suicide is immoral, high spirituality     Other Protective Factors  religious,  feels responsible for her kids     Describe any suicidal, self-injurious or aggressive behavior (include dates)  no SI/HI identified           VIOLENCE:   Violence/Abuse Risk - 05/01/19 1012        Violence Risk    History of Violence?  No     Current Attempt to Harm  No     Homicidal Ideation?  No     Homicidal Ideation With Intent?  No     Access to Weapons  Denies     Self Destruction Behaviors  Denies     Self Inflicted Injury?  Denies     Aggression/Poor Impulse Control?  Denies     Medical Conditions Restraint Risk  No     Abuse History Restraint Risk  No        Domestic Abuse Assessment    * Do you feel safe at home?  Yes     * Do You Feel Unsafe in Your Relationship(s)?  No     Other Persons at Risk  None     Abuse/Trauma History  None     Abuse/Trauma (Current)  None     Restraining Order?  Not now and no past history              ADDITIONAL SCREENINGS:   PHQ9 SCREENING 03/14/2018 06/13/2018 11/19/2018   Patient refused PHQ-9 - - No   Little interest or pleasure in doing things Not at all Not at all Several Days    Feeling down, depressed, or hopeless More than Half the Days Not at all Several Days   Trouble falling asleep, staying asleep, or sleeping too much Nearly Every Day - Nearly Every Day   Feeling tired or having low energy Several Days - Nearly Every Day   Poor appetite or overeating Several Days - Nearly Every Day   Feeling bad about yourself - or that you are a failure or have let yourself or your family down Not at all - Several Days   Trouble concentrating on things, such as school work, reading the newspaper, or watching television Several Days - Nearly Every Day   Moving or speaking slowly that other people could have noticed.  Or the opposite - being so fidgety or restless that you have been  moving around a lot more than ususal Not at all - Not at all   Thoughts that you would be better off dead or of hurting yourself in some way Not at all - Several Days   TOTAL 8 - 16   If you checked off any problems, how difficult have these problems made it for you to do your work, take care of things at home, or get along with people? Somewhat difficult - Somewhat difficult     GAD-7 FLOWSHEET 06/13/2018 05/09/2017 04/13/2017   Patient Refused? - - -   Feeling Nervous/Anxious/ On Edge 1 0 3   Unable to Stop Worrying 0 0 3   Worrying Too Much about Different Things - 3 3   Trouble Relaxing - 0 3   Restless/Hard to Sit Still - 2 0   Easily Annoyed/Irritable - 0 2   Afraid As is Something Aweful - 3 2   How difficult has it made you to do work, take care of things at home, or get along with other people - Somewhat difficult Very difficult   GAD-7 Score - 8 16     SDOH LAST  3 VALUES 07/06/2016   Patient refused CONNECT-S Questionnaire? No   What is your housing situation today? I have housing today, but I am worried about losing housing in the future.   In the last 12 months we worried that our food would run out before we got money to buy more. Never True   In the last 12 months the food we bought didn't last and we didn't  have money to get more. Never True   In the last 12 months has the electric, gas or oil company threatened to shut off services in your home? No   In the last 12 months have you or your family ever had trouble getting transportation to medical appointments? No   Within the past 12 months did you skip medications to save money? No   Are you unemployed and looking for work? Yes   Can we refer you to free or low cost community programs (like food pantries) by sharing your name, phone, and address so they can reach you? Yes   Would you like help connecting to resources? (Please check the appropriate answers) (No Data)       MENTAL STATUS EXAMINATION:  Mental Status Exam - 05/01/19 1013        Mental Status    General Appearance  Not able to observe     Behavior  Cooperative     Level of Consciousness  Not able to observe     Orientation Level  Oriented x3;Oriented to situation     Attention/Concentration  WNL     Mannerisms/Movements  Not able to observe     Speech Rate  WNL     Speech Clarity  Clear     Speech Tone  Normal vocal inflection     Vocabulary/Fund of Knowledge  WNL     Memory  Intact     Thought Process  Goal-directed;Linear;Logical;Organized     Dissociative Symptoms  None     Thought Content  No abnormalities reported or observed     Hallucinations  None     Suicidal Thoughts  None     Homicidal Thoughts  None     Mood  Euphoric     Judgment  Good      Insight  Good            RISK ASSESSMENTS:       Violence: low (1)     Addiction: low (1)     CURRENT ASSESSMENT:    Making changes - putting limits on son  Starting to exercise    DIAGNOSES:    No diagnosis found.              REVIEWING TODAY'S VISIT  CLINICAL INTERVENTIONS TODAY: individual therapy    PATIENT'S RESPONSE TO INTERVENTIONS:   appreiative    TIME SPENT IN PSYCHOTHERAPY: 45 minutes      PLAN: on wait list for new therapy. On meds per PCP    CARE PLAN/ EPISODES:  Linked Episodes   Type: Episode: Status: Noted: Resolved: Last update: Updated  by:   [place] 04/02/2019  05/01/2019 10:08 AM Leavy Cella, LICSW      Comments:           Leavy Cella, LICSW.Marland Kitchen  Marland Kitchen

## 2019-07-16 ENCOUNTER — Ambulatory Visit (HOSPITAL_BASED_OUTPATIENT_CLINIC_OR_DEPARTMENT_OTHER): Payer: No Typology Code available for payment source | Admitting: Internal Medicine

## 2019-07-16 NOTE — Progress Notes (Signed)
Called X 2, unable to reach, left message with interpreter

## 2019-07-17 ENCOUNTER — Telehealth (HOSPITAL_BASED_OUTPATIENT_CLINIC_OR_DEPARTMENT_OTHER): Payer: Self-pay

## 2019-07-17 ENCOUNTER — Other Ambulatory Visit: Payer: Self-pay

## 2019-07-17 NOTE — Telephone Encounter (Signed)
Using interpreter, called patient to reschedule appointment, patient stated Shonna Chock is her PCP. She will call [facility] and reschedule her appointment at that office.       Fannie Knee, 07/17/2019

## 2019-08-20 ENCOUNTER — Encounter (HOSPITAL_BASED_OUTPATIENT_CLINIC_OR_DEPARTMENT_OTHER): Payer: Self-pay

## 2019-09-13 ENCOUNTER — Ambulatory Visit: Payer: No Typology Code available for payment source

## 2019-09-13 DIAGNOSIS — F331 Major depressive disorder, recurrent, moderate: Secondary | ICD-10-CM | POA: Diagnosis present

## 2019-09-13 DIAGNOSIS — F411 Generalized anxiety disorder: Secondary | ICD-10-CM

## 2019-09-14 NOTE — Progress Notes (Signed)
ADULT OUTPATIENT PSYCHIATRY (OPD and PCBHI)  THERAPY FOLLOW UP       CHIEF COMPLAINT:   "I am grateful to the therapy services provided by my former therapist... I want to continue therapy with you.   I've been feeling very stressed and worried and I need help regarding my current stressors."     INTERVAL HISTORY: Patient is a a transfer from former provider Leavy Cella) who resigned from [facility].   Patient is a 39-y-o married [place] woman, mother of three, who presented with hx of GAD and MDD.   Pt reports that her current stress, anxiety and depression are exacerbated by her older kids' behaviors and poor choices (son, age 77, and daughter age 52). Both kids are involved with drugs (marijuana); daughter is now out of the house. She reports that son is a father of a 1-y-o daughter from a dysfunctional relationship with his GF, [place], who is bipolar and drug-involved. Child lives two days/week with her mother and most of the time with pt and pt's son.   Pt is worried that grand-daughter might be witnessing fighting between son and his GF and be exposed to drug.   Son does not have legal custody of the baby and mother is not suitable to take care of child. Pt is thinking about reporting case to DCF.   Pt states that her 12-y-o younger son is doing well, and that she has a stable marriage.   Pt is on med and denies any acute safety issues         CURRENT MEDICATIONS:    Current Outpatient Medications   Medication Sig    ferrous sulfate 325 (65 FE) MG EC tablet Take 1 tablet by mouth 3 (three) times daily with meals    traZODone (DESYREL) 50 MG tablet Take 1 tablet by mouth nightly    FLUoxetine (PROZAC) 20 MG capsule Take 1 capsule by mouth daily    FLUoxetine (PROZAC) 40 MG capsule Take 1 capsule by mouth daily    Multiple Vitamin (MULTIVITAMIN) TABS Take 1 tablet by mouth daily    cholecalciferol (VITAMIN D3) 2000 UNIT TABS tablet Take 1 tablet by mouth daily Start taking this after you have finished  the 8 week course of weekly high dose vitamin D.    ergocalciferol (VITAMIN D2) 50000 UNIT capsule Take 1 capsule by mouth once a week    MAGNESIUM CITRATE PO Take by mouth    Cyanocobalamin (B-12 PO) Take by mouth     No current facility-administered medications for this visit.         LANGUAGE OF CARE:    [place]      LANGUAGE NEEDS MET:    Provider/Staff Proficient in Patients Language (not [place])    CURRENT TREATMENT/CONTACT INFO FOR OTHER AGENCIES AND MENTAL HEALTH PROVIDERS (if applicable):    Contact/Community Support    No documentation.         COLUMBIA RISK ASSESSMENTS:   Suicide:  C-SSRS OP Last Contact Screener - 09/13/19 1539        C-SSRS OP Last Contact Screener    Have you wished you were dead or wished you could go to sleep and not wake up?  No     Have you actually had any thoughts of killing yourself?   No     Have you done anything, started to do anything, or prepared to do anything to end your life?  No     C-SSRS OP Last  Contact Screener Risk Score  No Risk         C-SSRS Risk Assessment    No documentation.         VIOLENCE:   Violence/Abuse Risk    No documentation.            ADDITIONAL SCREENINGS:   PHQ9 SCREENING 03/14/2018 06/13/2018 11/19/2018   Patient refused PHQ-9 - - No   Little interest or pleasure in doing things Not at all Not at all Several Days   Feeling down, depressed, or hopeless More than Half the Days Not at all Several Days   Trouble falling asleep, staying asleep, or sleeping too much Nearly Every Day - Nearly Every Day   Feeling tired or having low energy Several Days - Nearly Every Day   Poor appetite or overeating Several Days - Nearly Every Day   Feeling bad about yourself - or that you are a failure or have let yourself or your family down Not at all - Several Days   Trouble concentrating on things, such as school work, reading the newspaper, or watching television Several Days - Nearly Every Day   Moving or speaking slowly that other people could  have noticed.  Or the opposite - being so fidgety or restless that you have been  moving around a lot more than ususal Not at all - Not at all   Thoughts that you would be better off dead or of hurting yourself in some way Not at all - Several Days   TOTAL 8 - 16   If you checked off any problems, how difficult have these problems made it for you to do your work, take care of things at home, or get along with people? Somewhat difficult - Somewhat difficult     GAD-7 FLOWSHEET 06/13/2018 05/09/2017 04/13/2017   Patient Refused? - - -   Feeling Nervous/Anxious/ On Edge 1 0 3   Unable to Stop Worrying 0 0 3   Worrying Too Much about Different Things - 3 3   Trouble Relaxing - 0 3   Restless/Hard to Sit Still - 2 0   Easily Annoyed/Irritable - 0 2   Afraid As is Something Aweful - 3 2   How difficult has it made you to do work, take care of things at home, or get along with other people - Somewhat difficult Very difficult   GAD-7 Score - 8 16     SDOH LAST 3 VALUES 07/06/2016   Patient refused CONNECT-S Questionnaire? No   What is your housing situation today? I have housing today, but I am worried about losing housing in the future.   In the last 12 months we worried that our food would run out before we got money to buy more. Never True   In the last 12 months the food we bought didn't last and we didn't have money to get more. Never True   In the last 12 months has the electric, gas or oil company threatened to shut off services in your home? No   In the last 12 months have you or your family ever had trouble getting transportation to medical appointments? No   Within the past 12 months did you skip medications to save money? No   Are you unemployed and looking for work? Yes   Can we refer you to free or low cost community programs (like food pantries) by sharing your name, phone, and address so they can reach you? Yes  Would you like help connecting to resources? (Please check the appropriate answers) (No Data)        MENTAL STATUS EXAMINATION:  Mental Status Exam - 09/13/19 1537        Mental Status    General Appearance  Not able to observe     Behavior  Cooperative     Level of Consciousness  Not able to observe     Orientation Level  Oriented x3;Oriented to situation     Attention/Concentration  WNL     Mannerisms/Movements  Not able to observe     Speech Rate  WNL     Speech Clarity  Clear     Speech Tone  Normal vocal inflection     Vocabulary/Fund of Knowledge  WNL     Memory  Intact     Thought Process  Goal-directed;Linear;Logical;Organized     Dissociative Symptoms  None     Thought Content  No abnormalities reported or observed     Hallucinations  None     Suicidal Thoughts  None     Homicidal Thoughts  None     Mood  Anxious     Judgment  Good      Insight  Good            RISK ASSESSMENTS:       Violence: low (1)     Addiction: low (1)     CURRENT ASSESSMENT:  Pateint presents with significant stress, anxious/worried mood associated with son's and his GF's behaviors (fighting, drug use) and impact on their baby-girl.   Exhausted from worrying about grand-daughter   Stable marriage   Med compliant   Help-seeking     DIAGNOSES:     Major depressive disorder, recurrent episode, moderate ([place])  (primary encounter diagnosis)   SNOMED CT(R): RECURRENT MAJOR DEPRESSIVE EPISODES, MODERATE     Generalized anxiety disorder   SNOMED CT(R): GENERALIZED ANXIETY DISORDER                REVIEWING TODAY'S VISIT  CLINICAL INTERVENTIONS TODAY: Risk assessment done; Hx taken; individual psychotherapy provided     PATIENT'S RESPONSE TO INTERVENTIONS: Positive and appreciative     TIME SPENT IN PSYCHOTHERAPY: 45 minutes       PLAN: Continue individual psychotherapy on a bi-weekly basis                 Continue on med               To discuss case in supervision                                 CARE PLAN/ EPISODES:  Linked Episodes   Type: Episode: Status: Noted: Resolved: Last update: Updated by:   [place] Active 04/02/2019  09/13/2019  3:32 PM Leavy Cella, LICSW      Comments:         Trinna Balloon, [facility] (T)

## 2019-10-03 ENCOUNTER — Ambulatory Visit: Payer: No Typology Code available for payment source

## 2019-10-03 DIAGNOSIS — F331 Major depressive disorder, recurrent, moderate: Secondary | ICD-10-CM | POA: Diagnosis present

## 2019-10-03 DIAGNOSIS — F411 Generalized anxiety disorder: Secondary | ICD-10-CM

## 2019-10-03 NOTE — Progress Notes (Signed)
ADULT OUTPATIENT PSYCHIATRY (OPD and PCBHI)  THERAPY FOLLOW UP       CHIEF COMPLAINT:   "I've been feeling less stressed, but currently worried about my daughter's behavior."     INTERVAL HISTORY: Patient is a transfer from former provider Leavy Cella) who resigned from [place].   Patient is a 5-y-o married [place] woman, mother of three, who presented with hx of GAD and MDD.   Patient's stress, anxiety and depression are exacerbated by her older kids' behaviors and poor choices (son, age 76, and daughter age 75). Both kids are involved with drugs (marijuana); daughter is now out of the house.   She states that daughter disclosed to her that she has been hitting her arm due to her emotional distress (cannot sleep).   Pt wants to help daughter Ina [employer]) finding the professional services that she needs.   She also worries about her 22-y-o son who is a father of a 1-y-o daughter from a dysfunctional relationship with his GF, [place], who is bipolar and drug-involved.  Patient states that her 12-y-o younger son is doing well, and that she has a stable marriage.   She is on med and denies any acute safety issues.          CURRENT MEDICATIONS:    Current Outpatient Medications   Medication Sig    ferrous sulfate 325 (65 FE) MG EC tablet Take 1 tablet by mouth 3 (three) times daily with meals    traZODone (DESYREL) 50 MG tablet Take 1 tablet by mouth nightly    FLUoxetine (PROZAC) 20 MG capsule Take 1 capsule by mouth daily    FLUoxetine (PROZAC) 40 MG capsule Take 1 capsule by mouth daily    Multiple Vitamin (MULTIVITAMIN) TABS Take 1 tablet by mouth daily    cholecalciferol (VITAMIN D3) 2000 UNIT TABS tablet Take 1 tablet by mouth daily Start taking this after you have finished the 8 week course of weekly high dose vitamin D.    ergocalciferol (VITAMIN D2) 50000 UNIT capsule Take 1 capsule by mouth once a week    MAGNESIUM CITRATE PO Take by mouth    Cyanocobalamin (B-12 PO) Take by mouth     No current  facility-administered medications for this visit.         LANGUAGE OF CARE:    [place]      LANGUAGE NEEDS MET:    Provider/Staff Proficient in Patients Language (not [place])    CURRENT TREATMENT/CONTACT INFO FOR OTHER AGENCIES AND MENTAL HEALTH PROVIDERS (if applicable):    Contact/Community Support    No documentation.         COLUMBIA RISK ASSESSMENTS:   Suicide:  C-SSRS OP Last Contact Screener - 10/03/19 0949        C-SSRS OP Last Contact Screener    Have you wished you were dead or wished you could go to sleep and not wake up?  No     Have you actually had any thoughts of killing yourself?   No     Have you done anything, started to do anything, or prepared to do anything to end your life?  No     C-SSRS OP Last Contact Screener Risk Score  No Risk         C-SSRS Risk Assessment    No documentation.         VIOLENCE:   Violence/Abuse Risk    No documentation.  ADDITIONAL SCREENINGS:   PHQ9 SCREENING 03/14/2018 06/13/2018 11/19/2018   Patient refused PHQ-9 - - No   Little interest or pleasure in doing things Not at all Not at all Several Days   Feeling down, depressed, or hopeless More than Half the Days Not at all Several Days   Trouble falling asleep, staying asleep, or sleeping too much Nearly Every Day - Nearly Every Day   Feeling tired or having low energy Several Days - Nearly Every Day   Poor appetite or overeating Several Days - Nearly Every Day   Feeling bad about yourself - or that you are a failure or have let yourself or your family down Not at all - Several Days   Trouble concentrating on things, such as school work, reading the newspaper, or watching television Several Days - Nearly Every Day   Moving or speaking slowly that other people could have noticed.  Or the opposite - being so fidgety or restless that you have been  moving around a lot more than ususal Not at all - Not at all   Thoughts that you would be better off dead or of hurting yourself in some way Not at  all - Several Days   TOTAL 8 - 16   If you checked off any problems, how difficult have these problems made it for you to do your work, take care of things at home, or get along with people? Somewhat difficult - Somewhat difficult     GAD-7 FLOWSHEET 06/13/2018 05/09/2017 04/13/2017   Patient Refused? - - -   Feeling Nervous/Anxious/ On Edge 1 0 3   Unable to Stop Worrying 0 0 3   Worrying Too Much about Different Things - 3 3   Trouble Relaxing - 0 3   Restless/Hard to Sit Still - 2 0   Easily Annoyed/Irritable - 0 2   Afraid As is Something Aweful - 3 2   How difficult has it made you to do work, take care of things at home, or get along with other people - Somewhat difficult Very difficult   GAD-7 Score - 8 16     SDOH LAST 3 VALUES 07/06/2016   Patient refused CONNECT-S Questionnaire? No   What is your housing situation today? I have housing today, but I am worried about losing housing in the future.   In the last 12 months we worried that our food would run out before we got money to buy more. Never True   In the last 12 months the food we bought didn't last and we didn't have money to get more. Never True   In the last 12 months has the electric, gas or oil company threatened to shut off services in your home? No   In the last 12 months have you or your family ever had trouble getting transportation to medical appointments? No   Within the past 12 months did you skip medications to save money? No   Are you unemployed and looking for work? Yes   Can we refer you to free or low cost community programs (like food pantries) by sharing your name, phone, and address so they can reach you? Yes   Would you like help connecting to resources? (Please check the appropriate answers) (No Data)       MENTAL STATUS EXAMINATION:  Mental Status Exam - 10/03/19 0948        Mental Status    General Appearance  Not able to observe  Behavior  Cooperative     Level of Consciousness  Not able to observe     Orientation Level  Grossly  Intact     Attention/Concentration  WNL     Mannerisms/Movements  Not able to observe     Speech Rate  WNL     Speech Clarity  Clear     Speech Tone  Normal vocal inflection     Vocabulary/Fund of Knowledge  WNL     Memory  Intact     Thought Process  Goal-directed;Linear;Logical     Dissociative Symptoms  None     Thought Content  No abnormalities reported or observed     Hallucinations  None     Suicidal Thoughts  None     Homicidal Thoughts  None     Mood  Anxious     Judgment  Good      Insight  Good            RISK ASSESSMENTS:       Violence: low (1)     Addiction: low (1)     CURRENT ASSESSMENT:    Patient presents with stress, anxious/worried mood associated with children's behaviors.   Exhausted from worrying about them   Stable marriage   Med compliant   Help-seeking     DIAGNOSES:     Major depressive disorder, recurrent episode, moderate ([place])  (primary encounter diagnosis)   SNOMED CT(R): RECURRENT MAJOR DEPRESSIVE EPISODES, MODERATE     GAD (generalized anxiety disorder)   SNOMED CT(R): GENERALIZED ANXIETY DISORDER                REVIEWING TODAY'S VISIT  CLINICAL INTERVENTIONS TODAY: Risk assessment done; Hx taken; individual psychotherapy provided     PATIENT'S RESPONSE TO INTERVENTIONS: Positive and appreciative     TIME SPENT IN PSYCHOTHERAPY: 40 minutes       PLAN: Continue individual psychotherapy on a bi-weekly basis                 Continue on med               To discuss case in supervision                                 CARE PLAN/ EPISODES:  Linked Episodes   Type: Episode: Status: Noted: Resolved: Last update: Updated by:   [place] 04/02/2019  10/03/2019  9:43 AM Leavy Cella, LICSW      Comments:         Trinna Balloon, [facility] (T)

## 2019-10-08 ENCOUNTER — Other Ambulatory Visit (HOSPITAL_BASED_OUTPATIENT_CLINIC_OR_DEPARTMENT_OTHER): Payer: Self-pay

## 2019-10-08 ENCOUNTER — Encounter (HOSPITAL_BASED_OUTPATIENT_CLINIC_OR_DEPARTMENT_OTHER): Payer: Self-pay

## 2019-10-08 NOTE — BH OP Treatment Plan (Unsigned)
Patient/Guardian contributed to the creation of the treatment plan.     Strengths/Skills: Stable family     Potential Barriers: Undocumented, not working     Patient stated goals: To get custody of grand daughter     Problem 1: Depressed and anxious about situation with grand daughter     Short Term Goals: Patient will re-engage in therapy   Patient will re-start psych meds if needed   Short Term Target:  60 days  Short TermTarget Date: 12/07/2019  Short Term Goal #1 Progress: Yes   Long Term Goals: Straighten out custody of grand daughter   Patient will maintain baseline     Long Term Target:  90 days  Long TermTarget Date:  01/06/2020   Long Term Goal #1 Progress:   Yes     Intervention #1: Individual psychotherapy   Intervention Frequency:  biweekly  Intervention Duration:  90 days   Intervention Responsibility:  Patient and provider- Jaan Fischel Marcos Dasilva, [facility]       Intervention2: Refer to psychopharm if necessary     Intervention Frequency: as needed    Intervention Duration: 90 days     Intervention Responsibility: TBA

## 2019-10-24 ENCOUNTER — Ambulatory Visit (HOSPITAL_BASED_OUTPATIENT_CLINIC_OR_DEPARTMENT_OTHER): Payer: No Typology Code available for payment source

## 2020-02-10 ENCOUNTER — Ambulatory Visit (HOSPITAL_BASED_OUTPATIENT_CLINIC_OR_DEPARTMENT_OTHER): Payer: Self-pay | Admitting: Lab

## 2020-04-12 ENCOUNTER — Emergency Department
Admission: EM | Admit: 2020-04-12 | Discharge: 2020-04-12 | Disposition: A | Discharge status: Home / Self Care | Payer: No Typology Code available for payment source | Attending: Emergency Medicine | Admitting: Emergency Medicine

## 2020-04-12 ENCOUNTER — Other Ambulatory Visit: Payer: Self-pay

## 2020-04-12 ENCOUNTER — Encounter (HOSPITAL_BASED_OUTPATIENT_CLINIC_OR_DEPARTMENT_OTHER): Payer: Self-pay

## 2020-04-12 DIAGNOSIS — Y69 Unspecified misadventure during surgical and medical care: Secondary | ICD-10-CM | POA: Insufficient documentation

## 2020-04-12 DIAGNOSIS — G8918 Other acute postprocedural pain: Secondary | ICD-10-CM | POA: Diagnosis not present

## 2020-04-12 DIAGNOSIS — Z9889 Other specified postprocedural states: Secondary | ICD-10-CM | POA: Insufficient documentation

## 2020-04-12 DIAGNOSIS — T8131XA Disruption of external operation (surgical) wound, not elsewhere classified, initial encounter: Secondary | ICD-10-CM | POA: Diagnosis present

## 2020-04-12 NOTE — Discharge Instructions (Signed)
You were evaluated by surgery today.  We sent a referral to surgery.  You will be contacted to schedule a follow-up appointment with Dr. Geryl Rankins, who is a Engineer, petroleum within 2 weeks.  If you notice any change in the incision appearance until the appointment, please call the office of Dr. Doyle Askew with water and soap every day  Apply gauze as reviewed with the surgical team where the wound has a dehiscence  Come back to the emergency department in case of any new, concerning, worrisome symptoms

## 2020-04-12 NOTE — ED Provider Notes (Signed)
The patient was seen primarily by me. ED nursing record was reviewed. Select prior records as available electronically through the Epic record were reviewed.     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 She also has no past medical history of Anesthesia complication, Difficult airway, Malignant hypertension, or PONV (postoperative nausea and vomiting). The pt presents for evaluation of several surgical incisions that the patient had after she had skin removal performed in [place] on January 18 of 4 extremities.  Patient reports that she had proximal incisions of both legs and arms, and that she was scheduled to have stitches removed after 6 weeks that the patient has not done after the patient noticed that some areas of the incision sites never closed.  Patient denies fever and chills.  She reports that bilateral groin incision sites are painful and she has difficulty to sit and go to the bathroom due to pain.  She cleaned with water and soap daily.  She did not apply local antibiotics.  No rash.  No tingling, numbness or weakness.    Patient has a history of gastric bypass surgery in 2018      Triage Documentation     Kimberly Cocker, RN 04/12/2020 18:44             Pt presents after sutures in both legs opened up. Pt reports having skin removal surgery after losing a lot of weight on 03/17/20. Pt reports that incision sites are pink but not bleeding. Pain 10/10.              ROS: Pertinent positives were reviewed as per the HPI above. All other systems were reviewed and are negative.  Kimberly Lindsey  Language of care: [place] Kimberly Lindsey)  MRN: [id number]  PCP: Kimberly Baars, MD  Mode of arrival to ED: Relative.  Chief complaint: Incision Pain    Past Medical History/Problem list:  Past Medical  History:  05/26/2006: Adjustment disorder with mixed anxiety and depressed mood  No date: Asthma      Comment:  peviously used a vaporizer  04/22/2009: Obesity  No date: Other anxiety states  05/17/2006: PPD positive  Patient Active Problem List:     Adjustment disorder with mixed anxiety and depressed mood     Acute depression     Plantar fasciitis, bilateral     Post traumatic stress disorder (PTSD)     GAD (generalized anxiety disorder)     Neurocysticercosis     Tension headache     History of bariatric surgery     LOC (loss of consciousness) (HCC)     History of seizures as a child     Anemia     Vitamin D deficiency     Family history of glaucoma     H/O abdominoplasty     Sleep disorder     Family history of thyroid disease     Acute gastritis without hemorrhage     Restless legs    Past Surgical History: Past Surgical History:  No date: EXCISION SKIN ABD INFRAUMBILICAL PANNICULECTOMY  02/2016: GASTRIC BYPASS; N/A      Comment:  [facility]   No date: OB ANTEPARTUM CARE CESAREAN DLVR & POSTPARTUM      Comment:  c/s x 3  No date: TUBAL LIGATION  Social History:  Social History     Socioeconomic History    Marital status: Single     Spouse name: Not on file    Number of children: Not on file    Years of education: Not on file    Highest education level: Not on file   Occupational History    Not on file   Tobacco Use    Smoking status: Never Smoker    Smokeless tobacco: Never Used   Substance and Sexual Activity    Alcohol use: No    Drug use: No    Sexual activity: Yes     Partners: Male     Comment: H/O abnormal pap after her last C/S, S/P cauterization, normal since then.  No H/O STDs.   Other Topics Concern    Not on file   Social History Narrative    Lives with the father of her third son - and son        2 other sons are in [place] - ages 93, 96 - live with her mom    Ex husband left her - is not supporting 2 sons - told her she was on her own.        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        Lives in [place] with husband and son.     2 kids in [place]--> came to [place] 2016 to visit.        06/24/15:    Lives with sons: 10, 63, 50 yo and boyfriend. Feels safe at home    Works as Designer, television/film set Strain:     Difficulty of Paying Living Expenses: Not on file  Food Insecurity:     Worried About Programme researcher, broadcasting/film/video in the Last Year: Not on file    [employer] of Food in the Last Year: Not on file  Transportation Needs:     Freight forwarder (Medical): Not on file    Lack of Transportation (Non-Medical): Not on file  Physical Activity:     Days of Exercise per Week: Not on file    Minutes of Exercise per Session: Not on file  Stress:     Feeling of Stress : Not on file  Social Connections:     Frequency of Communication with Friends and Family: Not on file    Frequency of Social Gatherings with Friends and Family: Not on file    Attends Religious Services: Not on file    Active Member of Clubs or Organizations: Not on file    Attends Banker Meetings: Not on file    Marital Status: Not on file  Intimate Partner Violence:     Fear of Current or Ex-Partner: Not on file    Emotionally Abused: Not on file    Physically Abused: Not on file    Sexually Abused: Not on file     Allergies: Review of Patient's Allergies indicates:  No Known Allergies    Immunizations:   Immunization History   Administered Date(s) Administered    INFLUENZA VIRUS TRI W/PRESV VACCINE 18/> YRS IM (PRIVATE) 04/20/2006    PPD 05/15/2006    Tdap (adacel) 05/27/2013          Medications:  Prior to Admission Medications   Prescriptions Last Dose Informant Patient Reported? Taking?   Cyanocobalamin (B-12 PO)   Yes No   Sig: Take  by mouth   FLUoxetine (PROZAC) 20 MG capsule   No No   Sig: Take 1 capsule by mouth daily   FLUoxetine (PROZAC) 40 MG capsule   No No   Sig: Take 1 capsule by mouth daily   MAGNESIUM  CITRATE PO   Yes No   Sig: Take by mouth   Multiple Vitamin (MULTIVITAMIN) TABS   No No   Sig: Take 1 tablet by mouth daily   cholecalciferol (VITAMIN D3) 2000 UNIT TABS tablet   No No   Sig: Take 1 tablet by mouth daily Start taking this after you have finished the 8 week course of weekly high dose vitamin D.   ergocalciferol (VITAMIN D2) 50000 UNIT capsule   No No   Sig: Take 1 capsule by mouth once a week   traZODone (DESYREL) 50 MG tablet   No No   Sig: Take 1 tablet by mouth nightly      Facility-Administered Medications: None     Physical Exam (ED Bed 25/25-A):   Patient Vitals for the past 999 hrs:   BP Temp Pulse Resp SpO2   04/12/20 1842 174/96 98 F 88 18 98 %     GENERAL:  WDWN, no acute distress, non-toxic   SKIN:  Warm & Dry, no rash, no petechiae or purpurae.  LUNGS:  Clear to auscultation bilaterally. No wheezes, rales, rhonchi.   HEART:  RRR.  No murmurs, rubs, or gallops.   ABDOMEN:  Soft, NTND.  No involuntary guarding or rebound.   Bilateral groin incisions with no drainage at the skin, no fluctuance, no surrounding erythema, edema, warmth. Stiches in place.  No surrounding rash, no crepitus or necrosis.  2 small areas of dehiscence of the right groin incision with no drainage at the skin, no crepitus.  EXTREMITIES:  No obvious deformities.  Bilateral proximal upper extremity incisions with stitches still in place, closed, diffusely tender to palpation, no fluctuance, no overlying erythema, edema, warmth.  NEUROLOGIC:  Alert; moves all extremities; speaking in clear fluent sentences.   PSYCHIATRIC:  Appropriate for age, time of day, and situation    Medications Given in the ED:    Medications - No data to display Radiology Results:  See ED COURSE   Lab Results:     Labs Reviewed - No data to display         Other Results and OLD/PRIOR records information and data (e.g. ECG, visual acuity):  See ED COURSE     ED Course and Medical Decision-making:  40 year old female presenting for evaluation of  surgical incisions which appear to have delayed healing, with 2 areas with dehiscence in right groin.  No drainage at the skin.  No fever.  Her signs are stable.  Will contact surgery for evaluation    ED Course as of Apr 12 2021   Wynelle Link Apr 12, 2020   2016 Patient updated on plan of care.  The patient feels comfortable going home.      1954 Surgery: reviewed local wound care with patient, gauze with wet corner tucked in area which are opened in right groin. Rest of the incisions: apply dry gauze. Dr. Reformat in 2 weeks. Keep stitches in place.       1911 Surgery           Referral sent to plastic surgery to organize follow-up with Dr. Reformat in 2 weeks.  Local wound care reviewed with patient. Strict return precautions reviewed with patient who follows comfortable going  home.    Patient educated on diagnosis(es); she states understanding and agreement with plan of care.  Reasons to return to the ED were reviewed in detail. She agrees with this plan and disposition.    Disposition: Discharge    Condition on Discharge:  Stable    Diagnosis/Diagnoses:  Postoperative wound dehiscence, initial encounter      Dorcas CarrowSophie Monnier-Serov, MD MPH  Attending physician  Emergency department  [facility]    This Emergency Department patient encounter note was created using voice-recognition software and in real time during the ED visit.

## 2020-04-12 NOTE — Narrator Note (Signed)
Patient Disposition  Patient education for diagnosis, medications, activity, diet and follow-up.  Patient left ED 8:28 PM.  Patient rep received written instructions.    Interpreter to provide instructions: No    Patient belongings with patient: YES    Have all existing LDAs been addressed? N/A    Have all IV infusions been stopped? N/A    Destination: Discharged to home

## 2020-04-12 NOTE — ED Triage Note (Signed)
Pt presents after sutures in both legs opened up. Pt reports having skin removal surgery after losing a lot of weight on 03/17/20. Pt reports that incision sites are pink but not bleeding. Pain 10/10.

## 2020-04-12 NOTE — Narrator Note (Addendum)
Patient in bed.  Calm, c/o pain  Call light given.  Awaiting Provider orders.

## 2020-04-12 NOTE — Consults (Signed)
-SURGERY CONSULT NOTE -    Reason for Consult: Wound pain, dehisence      Consult Requested by: ED    HPI:  Kimberly Lindsey is a 40 yo F w/ hx RYGB 2018, panniculectomy 2019, and brachioplasty/thighplasty 03/17/2020 in [place] who presents to the ED due to persistent incisional pain and concern for wound dehiscence. She underwent brachioplasty/thighplasty in [place] 3.5 weeks ago and was discharged with PO abx x3 and oxycodone. She was instructed to go to her local ED for groin suture removal in 6 weeks.     She reports experiencing significant pain over all of her incisions, but particularly her bl groin incisions. Over the last 2 weeks, she noticed the right groin incision opening up. She washes the incisions daily and places dry gauze in the groin folds. Reports some serous drainage, but no purulent drainage or redness. No fevers. Currently taking 1500mg  APAP 2-3x/day.    Inpatient Problem List:   Patient Active Problem List:     Adjustment disorder with mixed anxiety and depressed mood     Acute depression     Plantar fasciitis, bilateral     Post traumatic stress disorder (PTSD)     GAD (generalized anxiety disorder)     Neurocysticercosis     Tension headache     History of bariatric surgery     LOC (loss of consciousness) (HCC)     History of seizures as a child     Anemia     Vitamin D deficiency     Family history of glaucoma     H/O abdominoplasty     Sleep disorder     Family history of thyroid disease     Acute gastritis without hemorrhage     Restless legs      Past Medical History:   Past Medical History:  05/26/2006: Adjustment disorder with mixed anxiety and depressed mood  No date: Asthma      Comment:  peviously used a vaporizer  04/22/2009: Obesity  No date: Other anxiety states  05/17/2006: PPD positive    Past SurgicalHistory:   Past Surgical History:  No date: EXCISION SKIN ABD INFRAUMBILICAL PANNICULECTOMY  02/2016: GASTRIC BYPASS; N/A      Comment:  [facility]   No date: OB ANTEPARTUM CARE  CESAREAN DLVR & POSTPARTUM      Comment:  c/s x 3  No date: TUBAL LIGATION    Medications prior to Admission:   No current facility-administered medications on file prior to encounter.  traZODone (DESYREL) 50 MG tablet, Take 1 tablet by mouth nightly, Disp: 30 tablet, Rfl: 2  FLUoxetine (PROZAC) 20 MG capsule, Take 1 capsule by mouth daily, Disp: 30 capsule, Rfl: 5  FLUoxetine (PROZAC) 40 MG capsule, Take 1 capsule by mouth daily, Disp: 30 capsule, Rfl: 5  Multiple Vitamin (MULTIVITAMIN) TABS, Take 1 tablet by mouth daily, Disp: 30 tablet, Rfl: 11  cholecalciferol (VITAMIN D3) 2000 UNIT TABS tablet, Take 1 tablet by mouth daily Start taking this after you have finished the 8 week course of weekly high dose vitamin D., Disp: 30 tablet, Rfl: 11  ergocalciferol (VITAMIN D2) 50000 UNIT capsule, Take 1 capsule by mouth once a week, Disp: 8 capsule, Rfl: 0  MAGNESIUM CITRATE PO, Take by mouth, Disp: , Rfl:   Cyanocobalamin (B-12 PO), Take by mouth, Disp: , Rfl:         Current Inpatient Medications:          Allergies:   Review of  Patient's Allergies indicates:  No Known Allergies    TobaccoUse:   Social History    Tobacco Use      Smoking status: Never Smoker      Smokeless tobacco: Never Used      Alcohol:     Alcohol use No       Family History:   Review of patient's family history indicates:  Problem: Hypertension      Relation: Mother          Age of Onset: (Not Specified)  Problem: Glaucoma      Relation: Mother          Age of Onset: (Not Specified)  Problem: Diabetes      Relation: Father          Age of Onset: (Not Specified)          Comment: died of diabetes, also cholesterol  Problem: Lipids      Relation: Mother          Age of Onset: (Not Specified)  Problem: Diabetes      Relation: Mother          Age of Onset: (Not Specified)  Problem: Thyroid      Relation: Maternal Aunt          Age of Onset: (Not Specified)          Comment: multiple aunts/uncles  Problem: Glaucoma      Relation: Maternal Aunt           Age of Onset: (Not Specified)  Problem: Stroke      Relation: Maternal Grandmother          Age of Onset: (Not Specified)          Comment: died of stroke  Problem: Heart      Relation: Maternal Uncle          Age of Onset: 72          Comment: MI  Problem: Glaucoma      Relation: Maternal Uncle          Age of Onset: (Not Specified)  Problem: Glaucoma      Relation: Maternal Grandfather          Age of Onset: (Not Specified)  Problem: Cancer - Other      Relation: FamHxNeg          Age of Onset: (Not Specified)         Review of Systems:   Systems reviewed and all systems negative except  Skin surgical incisions x4    Physical Exam:   General appearance: alert, appears stated age and cooperative  Lungs: nonlabored breathing on room air  Heart:  normocardic, hypertensive  Abdomen: soft, nontender, nondistended, prior panniculectomy scar  Extremities: extremities normal, atraumatic, no cyanosis or edema  Skin: bl upper extremity incisions with absorbable sutures, healing well. bilateral groin incisions with running Nylon suture. No erythema or purulent drainage. left groin healing well. right groin with two 3cm areas of wound dehisence towards lower/posterior aspect     Vital Signs - Last 24 Hours:   Temp:  [98 F (36.7 C)] 98 F (36.7 C)  Pulse:  [88] 88  Resp:  [18] 18  BP: (174)/(96) 174/96    Vital Signs - Last 8 Hours:   BP: (174)/(96)   Temp:  [98 F (36.7 C)]   Pulse:  [88]   Resp:  [18]   SpO2:  [98 %]     Intake/Output  last 24hours (7a-7a):   No intake/output data recorded.    Last 8 Hours:   No intake/output data recorded.    DATA LAST 24 HOURS:     All data in last 24hours, labs only:    Recent labs:  No results for input(s): WBC, HGB, HCT, PLTA, NEUT, LYMPH, MO, EOS, BASO in the last 24 hours.    No results for input(s): NA, K, CL, CO2, BUN, CREAT, GLUCOSER, CA, MG, PHOS, TBILI, DBILI, IBIL, AST, ALT, ALKPHOS, ALBUMIN in the last 24 hours.      Lab Results   Component Value Date    UACOL YELLOW  06/02/2018    UACLA CLEAR 06/02/2018    UAPRO NEGATIVE 06/02/2018    UAGLU NEGATIVE 06/02/2018    UAKET NEGATIVE 06/02/2018    UAOCC NEGATIVE 06/02/2018    UANIT NEGATIVE 06/02/2018    UABIL NEGATIVE 06/02/2018    LEUKOCYTES NEGATIVE 06/02/2018         Imaging: N/A    Impression & Recommendations: 40 yo F w/ hx RYGB 2018, panniculectomy 2019, and brachioplasty/thighplasty 03/17/2020 in [place] presenting with persistent pain and concern for wound dehiscence. On exam, bl upper extremity and left groin incision healing well. Right groin incision with 2 small areas of wound dehiscence. No signs of infection of any of her 4 incisions. Her bl groin incisions are closed w/ non-absorbable sutures and will eventually need removal. However, at this point, removal of the right groin suture would likely result in a larger area of dehiscence and thus will defer removal until outpt followup.    - No abx indicated  - Recommend daily damp-to-dry gauze to be placed in two areas of wound dehiscence. Place dry gauze in bilateral groin folds at least daily and change prn for moisture  - Continue to cleanse wounds daily  - Follow up with Dr. Reformat as an outpatient in approx 2 weeks for suture removal  - Return precautions given  - Discussed w/ attending Dr. Vevelyn Royals, MD, 04/12/2020       Pager [phone]

## 2020-04-13 ENCOUNTER — Telehealth (HOSPITAL_BASED_OUTPATIENT_CLINIC_OR_DEPARTMENT_OTHER): Payer: Self-pay | Admitting: Licensed Practical Nurse

## 2020-04-13 NOTE — Telephone Encounter (Signed)
Next Appt  With General Surgery Lucienne Minks, MD)  04/17/2020 at 1:00 PM        Call placed to pt, via interpreter [phone]. Notified of appt & agrees w/ plan of care.

## 2020-04-17 ENCOUNTER — Ambulatory Visit: Payer: No Typology Code available for payment source | Attending: Plastic Surgery | Admitting: Plastic Surgery

## 2020-04-17 ENCOUNTER — Other Ambulatory Visit: Payer: Self-pay

## 2020-04-17 ENCOUNTER — Encounter (HOSPITAL_BASED_OUTPATIENT_CLINIC_OR_DEPARTMENT_OTHER): Payer: Self-pay | Admitting: Plastic Surgery

## 2020-04-17 DIAGNOSIS — Z9889 Other specified postprocedural states: Secondary | ICD-10-CM | POA: Diagnosis present

## 2020-04-17 NOTE — Progress Notes (Signed)
Plastic Surgery    This is a 40 yo woman who went to [facility] for Cosmetic Surgery. She had a thigh lift and arm lift on March 17, 2020. She is here now after being referred from the [facility] ER. She had some wound healing issues and was evaluated recently.    She is otherwise healthy and takes no prescription meds.  NKDA  No tobacco    Exam with my nurse present.    Thigh plasty scar in groin crease. Old prolene suture with tissue overgrowth and inflammation. No pus. No warmth or erythema.    Brachioplasty scars healing.    Plan: I removed any external permanent sutures. I gave her wound care advice. She will follow up in 2 weeks if needed.

## 2020-05-01 ENCOUNTER — Ambulatory Visit: Payer: No Typology Code available for payment source | Attending: Plastic Surgery | Admitting: Plastic Surgery

## 2020-05-01 ENCOUNTER — Other Ambulatory Visit: Payer: Self-pay

## 2020-05-01 ENCOUNTER — Encounter (HOSPITAL_BASED_OUTPATIENT_CLINIC_OR_DEPARTMENT_OTHER): Payer: Self-pay | Admitting: Plastic Surgery

## 2020-05-01 DIAGNOSIS — Z9889 Other specified postprocedural states: Secondary | ICD-10-CM | POA: Diagnosis present

## 2020-05-01 NOTE — Progress Notes (Signed)
Plastic Surgery    Pt follows up with a suture that is hanging down.    Exam: With Delores.  Right upper thigh. Monocryl suture spitting. Cut with suture kit.  Plan: Continue daily shower.  Increase activity per your surgeon.  Follow up with Surgeon in [place]

## 2020-06-30 ENCOUNTER — Other Ambulatory Visit: Payer: Self-pay

## 2020-06-30 ENCOUNTER — Encounter (HOSPITAL_BASED_OUTPATIENT_CLINIC_OR_DEPARTMENT_OTHER): Payer: Self-pay | Admitting: Family Medicine

## 2020-06-30 ENCOUNTER — Ambulatory Visit: Payer: No Typology Code available for payment source | Attending: Family Medicine | Admitting: Family Medicine

## 2020-06-30 VITALS — BP 117/80 | HR 67 | Temp 98.7°F | Ht 61.0 in | Wt 158.0 lb

## 2020-06-30 DIAGNOSIS — R635 Abnormal weight gain: Secondary | ICD-10-CM | POA: Diagnosis not present

## 2020-06-30 DIAGNOSIS — Z13220 Encounter for screening for lipoid disorders: Secondary | ICD-10-CM | POA: Insufficient documentation

## 2020-06-30 DIAGNOSIS — F411 Generalized anxiety disorder: Secondary | ICD-10-CM | POA: Insufficient documentation

## 2020-06-30 DIAGNOSIS — Z9884 Bariatric surgery status: Secondary | ICD-10-CM | POA: Insufficient documentation

## 2020-06-30 LAB — COMPREHENSIVE METABOLIC PANEL
ALANINE AMINOTRANSFERASE: 14 U/L (ref 12–45)
ALBUMIN: 4.4 g/dL (ref 3.4–5.2)
ALKALINE PHOSPHATASE: 84 U/L (ref 45–117)
ANION GAP: 18 mmol/L — ABNORMAL HIGH (ref 5–15)
ASPARTATE AMINOTRANSFERASE: 21 U/L (ref 8–34)
BILIRUBIN TOTAL: <0.2 mg/dL — ABNORMAL LOW (ref 0.2–1.0)
BUN (UREA NITROGEN): 12 mg/dL (ref 7–18)
CALCIUM: 9.1 mg/dl (ref 8.5–10.1)
CARBON DIOXIDE: 19 mmol/L — ABNORMAL LOW (ref 21–32)
CHLORIDE: 104 mmol/L (ref 98–107)
CREATININE: 0.6 mg/dL (ref 0.4–1.2)
ESTIMATED GLOMERULAR FILT RATE: >60 mL/min (ref >60)
Glucose Random: 87 mg/dL (ref 74–160)
POTASSIUM: 4.7 mmol/L (ref 3.5–5.1)
SODIUM: 140 mmol/L (ref 136–145)
TOTAL PROTEIN: 7 g/dL (ref 6.4–8.2)

## 2020-06-30 LAB — IRON: IRON: 40 ug/dL — ABNORMAL LOW (ref 50–170)

## 2020-06-30 LAB — CBC WITH PLATELET
ABSOLUTE NRBC COUNT: 0 10*3/uL (ref 0.0–0.0)
HEMATOCRIT: 36.6 % (ref 34.1–44.9)
HEMOGLOBIN: 11.2 g/dL (ref 11.2–15.7)
MEAN CORP HGB CONC: 30.6 g/dL — ABNORMAL LOW (ref 31.0–37.0)
MEAN CORPUSCULAR HGB: 28 pg (ref 26.0–34.0)
MEAN CORPUSCULAR VOL: 91.5 fl (ref 80.0–100.0)
MEAN PLATELET VOLUME: 11.6 fL (ref 8.7–12.5)
NRBC %: 0 % (ref 0.0–0.0)
PLATELET COUNT: 287 10*3/uL (ref 150–400)
RBC DISTRIBUTION WIDTH STD DEV: 40.8 fL (ref 35.1–46.3)
RED BLOOD CELL COUNT: 4 M/uL (ref 3.90–5.20)
WHITE BLOOD CELL COUNT: 7.1 10*3/uL (ref 4.0–11.0)

## 2020-06-30 LAB — LIPID PANEL
Cholesterol: 173 mg/dL (ref 0–239)
HIGH DENSITY LIPOPROTEIN: 66 mg/dL — ABNORMAL HIGH (ref 40–60)
LOW DENSITY LIPOPROTEIN DIRECT: 87 mg/dL (ref 0–189)
TRIGLYCERIDES: 98 mg/dL (ref 0–150)

## 2020-06-30 LAB — TOTAL IRON BINDING CAPACITY: TOTAL IRON BIND CAPACITY CALC: 519 ug/dL — ABNORMAL HIGH (ref 280–504)

## 2020-06-30 LAB — VITAMIN D,25 HYDROXY: VITAMIN D,25 HYDROXY: 27 ng/mL — ABNORMAL LOW (ref 30.0–100.0)

## 2020-06-30 LAB — THYROID SCREEN TSH REFLEX FT4: THYROID SCREEN TSH REFLEX FT4: 1.11 u[IU]/mL (ref 0.270–4.200)

## 2020-06-30 LAB — VITAMIN B12: VITAMIN B12: 457 pg/mL (ref 232–1245)

## 2020-06-30 MED ORDER — ESCITALOPRAM OXALATE 5 MG PO TABS
5.0000 mg | ORAL_TABLET | Freq: Every day | ORAL | 2 refills | Status: DC
Start: 2020-06-30 — End: 2020-07-31

## 2020-06-30 MED FILL — ESCITALOPRAM 5MG: 30 days supply | Qty: 30 | Fill #0 | Status: CP

## 2020-06-30 NOTE — Progress Notes (Signed)
S:    This 40 year old female comes to [facility] for vitamin check    1.) wt/health concerns/anxiety  - history of Gastric bypass surgery 5 years ago at [facility]  - is wondering how her vitamins are now   - concerned because has been gaining weight over last few years  - has been exercising at home but has less time because is taking care of her granddaughter full time -- this has also been stressful/anxious and thinks she has been eating more to soothe herself   - also works full time cleaning   - has no time to take care of herself   - sleeping well   - not currently taking any medications  - denies SI/HI    Most Recent Weight Reading(s)  06/30/20 : 71.7 kg (158 lb)  11/19/18 : 63 kg (139 lb)  06/02/18 : 60.8 kg (134 lb)  03/14/18 : 62.6 kg (138 lb)  01/31/18 : 62.6 kg (138 lb)          Review of Systems    As per HPI    All other systems reviewed and are negative.    Patient Active Problem List:     Adjustment disorder with mixed anxiety and depressed mood     Acute depression     Plantar fasciitis, bilateral     Post traumatic stress disorder (PTSD)     GAD (generalized anxiety disorder)     Neurocysticercosis     Tension headache     History of bariatric surgery     LOC (loss of consciousness) (HCC)     History of seizures as a child     Anemia     Vitamin D deficiency     Family history of glaucoma     H/O abdominoplasty     Sleep disorder     Family history of thyroid disease     Acute gastritis without hemorrhage     Restless legs    traZODone (DESYREL) 50 MG tablet, Take 1 tablet by mouth nightly, Disp: 30 tablet, Rfl: 2  FLUoxetine (PROZAC) 20 MG capsule, Take 1 capsule by mouth daily, Disp: 30 capsule, Rfl: 5  FLUoxetine (PROZAC) 40 MG capsule, Take 1 capsule by mouth daily, Disp: 30 capsule, Rfl: 5  Multiple Vitamin (MULTIVITAMIN) TABS, Take 1 tablet by mouth daily, Disp: 30 tablet, Rfl: 11  cholecalciferol (VITAMIN D3) 2000 UNIT TABS tablet, Take 1 tablet by mouth daily Start taking this after you have  finished the 8 week course of weekly high dose vitamin D., Disp: 30 tablet, Rfl: 11  ergocalciferol (VITAMIN D2) 50000 UNIT capsule, Take 1 capsule by mouth once a week, Disp: 8 capsule, Rfl: 0  MAGNESIUM CITRATE PO, Take by mouth, Disp: , Rfl:   Cyanocobalamin (B-12 PO), Take by mouth, Disp: , Rfl:     No current facility-administered medications for this visit.    Review of Patient's Allergies indicates:  No Known Allergies        O:    BP 117/80    Pulse 67    Temp 98.7 F (37.1 C) (Temporal)    Ht 5\' 1"  (1.549 m)    Wt 71.7 kg (158 lb)    SpO2 100%    BMI 29.85 kg/m     Physical Exam   Constitutional: BMI 29.8 NAD.  HENT:   Head: Normocephalic and atraumatic.   Cardiovascular: Normal rate, regular rhythm, normal heart sounds and intact distal pulses.  Exam reveals  no gallop and no friction rub.    Pulmonary/Chest: Effort normal and breath sounds normal. No respiratory distress. No wheezes, rales, or rhonchi. No chest wall tenderness.   Abdominal: Soft. Bowel sounds are normal. No distension and no mass. There is no tenderness. There is no rebound and no guarding.   Skin: Skin is warm and dry.   Neurological: Alert and oriented to person, place, and time.   Psychiatric: Normal mood and affect. Behavior is normal. Judgment and thought content normal. Good eye contact. Dressed and groomed appropriately.     A/P    (Z98.84) History of bariatric surgery  (primary encounter diagnosis)  (R63.5) Weight gain, abnormal  Comment: Weight gain likely in the setting of eating more due to stress as patient is currently taking care of her granddaughter full-time.  Patient is concerned about her vitamins and would like to see an endocrinologist.  Discussed that we will check that today and put in a referral- they are likely booking out several months and patient is okay with this as she would like to go in person to talk with them.  We discussed lifestyle at length, patient declined referral to nutritionist.  Plan:  COMPREHENSIVE METABOLIC PANEL, CBC WITH         PLATELET, VITAMIN D,25 HYDROXY, TOTAL IRON         BINDING CAPACITY, IRON, VITAMIN B12, REFERRAL         TO ENDOCRINOLOGY ( INT), THYROID SCREEN TSH         REFLEX FT4, HEMOGLOBIN A1C,     (F41.1) Anxiety state  Comment: Patient under more stress than usual and is feeling anxious with all of her responsibilities including full-time caregiving for her 86-year-old granddaughter, and working full-time as a Engineer, water, as well as taking care of her children in the home.  Patient is interested in starting antianxiety medications today.  No red flags.  Plan:   Will start low-dose Lexapro--discussed that this can take several weeks to take effect and she should take it every day  We will follow-up in 1 month and we may increase the dose at that time    (Z13.220) Lipid screening  Comment: Routine  Plan: LIPID PANEL,     Of note, patient is due for CPE/Pap and will return next month    Callback precautions discussed and all questions answered.     On the day of service, I spent 30-39 minutes caring for this patient, including time required for chart review, face-to-face patient evaluation, entering orders, counseling, and documentation of the encounter.    Drue Flirt, PA-C

## 2020-07-01 ENCOUNTER — Telehealth (HOSPITAL_BASED_OUTPATIENT_CLINIC_OR_DEPARTMENT_OTHER): Payer: Self-pay | Admitting: Family Medicine

## 2020-07-01 LAB — HEMOGLOBIN A1C
ESTIMATED AVERAGE GLUCOSE: 103 mg/dL (ref 74–160)
HEMOGLOBIN A1C: 5.2 % (ref 4.0–5.6)

## 2020-07-01 NOTE — Telephone Encounter (Signed)
Called pt to review results and let VM asking pt to call back to discuss further if she has questions.     - Vit D low -- recommend OTC Vit d3 1000 U daily with food  - Iron low (not anemic) -- can try eating more iron rich foods and/or treating with every other day iron supplements (take with Vit C source)   - otherwise WNL  - Can discuss more next month as well     Drue Flirt, PA-C

## 2020-07-31 ENCOUNTER — Ambulatory Visit: Payer: No Typology Code available for payment source | Attending: Family Medicine | Admitting: Family Medicine

## 2020-07-31 ENCOUNTER — Encounter (HOSPITAL_BASED_OUTPATIENT_CLINIC_OR_DEPARTMENT_OTHER): Payer: Self-pay | Admitting: Family Medicine

## 2020-07-31 ENCOUNTER — Other Ambulatory Visit: Payer: Self-pay

## 2020-07-31 VITALS — BP 132/72 | HR 75 | Temp 97.5°F | Ht 61.0 in | Wt 156.2 lb

## 2020-07-31 DIAGNOSIS — Z0000 Encounter for general adult medical examination without abnormal findings: Secondary | ICD-10-CM | POA: Diagnosis present

## 2020-07-31 DIAGNOSIS — Z7185 Encounter for immunization safety counseling: Secondary | ICD-10-CM | POA: Insufficient documentation

## 2020-07-31 DIAGNOSIS — Z1231 Encounter for screening mammogram for malignant neoplasm of breast: Secondary | ICD-10-CM | POA: Insufficient documentation

## 2020-07-31 MED ORDER — VITAMIN D 25 MCG (1000 UT) PO TABS
1.0000 | ORAL_TABLET | Freq: Every day | ORAL | 3 refills | Status: DC
Start: 2020-07-31 — End: 2021-08-03

## 2020-07-31 MED ORDER — ESCITALOPRAM OXALATE 10 MG PO TABS
10.0000 mg | ORAL_TABLET | Freq: Every day | ORAL | 3 refills | Status: DC
Start: 2020-07-31 — End: 2022-01-18

## 2020-07-31 MED FILL — VITAMIN D3 1000UNIT: 90 days supply | Qty: 90 | Fill #0 | Status: CP

## 2020-07-31 MED FILL — ESCITALOPRAM 10MG: 90 days supply | Qty: 90 | Fill #0 | Status: CP

## 2020-07-31 NOTE — Progress Notes (Signed)
Kimberly Lindsey is a 40 year old female that presents today for Physical        S:    1.) anxiety fup   - started Lexapro 5 mg a month   - helping with symptoms, still has baseline anxiety but less than before  - no SI/HI    Pt lives in [place] with husband and 3 kids, 1 granddaughter.   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   Feels safe at home.       OB Hx: G3 P3  Cycle pattern: monthly   LMP: 07/31/2020    Sexually active:yes  Current contraception: tubal ligation  STD Screening today:declines  Last PAP result:  2016 normal      Next Pap Due: today    Family Hx of Breast Ca: no  Mammogram: ordered    Dental: Needs to go   Optho: has glasses, referral placed      Nutrition: balanced  Exercise: has been walking more     Colorectal screening: Family history - aunts        Most Recent Weight Reading(s)  07/31/20 : 70.9 kg (156 lb 3.2 oz)  06/30/20 : 71.7 kg (158 lb)  11/19/18 : 63 kg (139 lb)  06/02/18 : 60.8 kg (134 lb)  03/14/18 : 62.6 kg (138 lb)      ROS  As per HPI      Patient Active Problem List:     Adjustment disorder with mixed anxiety and depressed mood     Acute depression     Plantar fasciitis, bilateral     Post traumatic stress disorder (PTSD)     GAD (generalized anxiety disorder)     Neurocysticercosis     Tension headache     History of bariatric surgery     LOC (loss of consciousness) (HCC)     History of seizures as a child     Anemia     Vitamin D deficiency     Family history of glaucoma     H/O abdominoplasty     Sleep disorder     Family history of thyroid disease     Acute gastritis without hemorrhage     Restless legs     Anxiety state      Past Medical History:  05/26/2006: Adjustment disorder with mixed anxiety and depressed mood  No date: Asthma      Comment:  peviously used a vaporizer  04/22/2009: Obesity  No date: Other anxiety states  05/17/2006: PPD positive    Past Surgical History:  No date: EXCISION SKIN ABD INFRAUMBILICAL  PANNICULECTOMY  02/2016: GASTRIC BYPASS; N/A      Comment:   medical   No date: OB ANTEPARTUM CARE CESAREAN DLVR & POSTPARTUM      Comment:  c/s x 3  No date: TUBAL LIGATION       Current Outpatient Medications:     escitalopram (LEXAPRO) 10 MG tablet, Take 1 tablet by mouth daily, Disp: 90 tablet, Rfl: 3    cholecalciferol (VITAMIN D3) 25 MCG (1000 UT) tablet, Take 1 tablet by mouth daily, Disp: 90 tablet, Rfl: 3    Multiple Vitamin (MULTIVITAMIN) TABS, Take 1 tablet by mouth daily, Disp: 30 tablet, Rfl: 11    ergocalciferol (VITAMIN D2) 50000 UNIT capsule, Take 1 capsule by mouth once a week, Disp: 8 capsule, Rfl: 0    MAGNESIUM CITRATE PO, Take by mouth, Disp: , Rfl:  Cyanocobalamin (B-12 PO), Take by mouth, Disp: , Rfl:     Review of Patient's Allergies indicates:  No Known Allergies    Review of patient's family history indicates:  Problem: Hypertension      Relation: Mother          Age of Onset: (Not Specified)  Problem: Glaucoma      Relation: Mother          Age of Onset: (Not Specified)  Problem: Diabetes      Relation: Father          Age of Onset: (Not Specified)          Comment: died of diabetes, also cholesterol  Problem: Lipids      Relation: Mother          Age of Onset: (Not Specified)  Problem: Diabetes      Relation: Mother          Age of Onset: (Not Specified)  Problem: Thyroid      Relation: Maternal Aunt          Age of Onset: (Not Specified)          Comment: multiple aunts/uncles  Problem: Glaucoma      Relation: Maternal Aunt          Age of Onset: (Not Specified)  Problem: Stroke      Relation: Maternal Grandmother          Age of Onset: (Not Specified)          Comment: died of stroke  Problem: Heart      Relation: Maternal Uncle          Age of Onset: 6435          Comment: MI  Problem: Glaucoma      Relation: Maternal Uncle          Age of Onset: (Not Specified)  Problem: Glaucoma      Relation: Maternal Grandfather          Age of Onset: (Not Specified)  Problem: Cancer -  Other      Relation: FamHxNeg          Age of Onset: (Not Specified)      Social History     Socioeconomic History    Marital status: Single     Spouse name: Not on file    Number of children: Not on file    Years of education: Not on file    Highest education level: Not on file   Occupational History    Not on file   Tobacco Use    Smoking status: Never Smoker    Smokeless tobacco: Never Used   Substance and Sexual Activity    Alcohol use: Yes     Comment: occasionally     Drug use: No    Sexual activity: Yes     Partners: Male     Comment: H/O abnormal pap after her last C/S, S/P cauterization, normal since then.  No H/O STDs.   Other Topics Concern    Not on file   Social History Narrative    Lives with the father of her third son - and son        2 other sons are in [place] - ages 6312, 2810 - live with her mom    Ex husband left her - is not supporting 2 sons - told her she was on her own.        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        Lives in [place] with husband and son.     2 kids in [place]--> came to [place] 2016 to visit.        06/24/15:    Lives with sons: 66, 74, 12 yo and boyfriend. Feels safe at home    Works as housecleaner        07/2020: Pt lives in [place] with husband and 3 kids, 1 granddaughter.     Was working as cleaner    Is primary caregiver for 64 year old granddaughter.     Feels safe at home.    Social Determinants of Health  Financial Resource Strain: Not on file  Food Insecurity: Not on file  Transportation Needs: Not on file  Physical Activity: Not on file  Stress: Not on file  Social Connections: Not on file  Intimate Partner Violence: Not on file  Housing Stability: Not on file          O:    BP 132/72    Pulse 75    Temp 97.5 F (36.4 C)    Ht 5\' 1"  (1.549 m)    Wt 70.9 kg (156 lb 3.2 oz)    SpO2 100%    BMI 29.51 kg/m       Most Recent BP Reading(s)  07/31/20 : 132/72  06/30/20 : 117/80  04/12/20 : 174/96  11/19/18 :  122/71  06/03/18 : 137/87        Most Recent Weight Reading(s)  07/31/20 : 70.9 kg (156 lb 3.2 oz)  06/30/20 : 71.7 kg (158 lb)  11/19/18 : 63 kg (139 lb)  06/02/18 : 60.8 kg (134 lb)  03/14/18 : 62.6 kg (138 lb)      Physical Exam   Constitutional: BMI 29.5 NAD.  HENT:   Head: Normocephalic and atraumatic.   Right Ear: External ear normal. Otoscopic exam reveals intact TM without bulging or erythema.   Left Ear: External ear normal. Otoscopic exam reveals intact TM without bulging or erythema.   Eyes: Conjunctivae and EOM are normal. Pupils are equal, round, and reactive to light.   Cardiovascular: Normal rate, regular rhythm, normal heart sounds.  Exam reveals no gallop and no friction rub.    Pulmonary/Chest: Effort normal and breath sounds normal. No respiratory distress. No wheezes, rales, or rhonchi. No chest wall tenderness.   Abdominal: Soft. Bowel sounds are normal. No distension and no mass. There is no tenderness. There is no rebound and no guarding.   Musculoskeletal: Normal range of motion. No edema and no tenderness.   Skin: Skin is warm and dry.   Neurological: Alert and oriented to person, place, and time.   Psychiatric: Normal mood and affect. Behavior is normal. Judgment and thought content normal. Good eye contact. Dressed and groomed appropriately.     Diet, Exercise, Wt. Control: Reviewed and Discussed  Seat Belts: Reviewed and Discussed  Smoke Detectors: Reviewed and Discussed  Dental Health: Reviewed and Discussed  Vision Health: Reviewed and Discussed  Mental Health: Reviewed and Discussed  Domestic Violence: Reviewed and Discussed  Sexual Health: Reviewed and Discussed  Health Care Proxy: Reviewed and Discussed  Osteoporosis: Reviewed and Discussed  Smoking: Reviewed and Discussed        A/P:    (Z00.00) Encounter for routine history and physical examination  (primary encounter diagnosis)  (Z71.85) Vaccine counseling  Comment: Routine health maintenance and counseling discussed  and reviewed.  Vaccines and routine labs discussed  Cancer screening: PAP deferred due to menses. Mammo ordered. No indication for early colorectal ca screening. Reviewed RF and s/sx of skin ca.   Vaccines: discussed importance of COVID vaccine, addressed vaccine hesitancy- pt will consider  Orders: Deferred  Plan:   REFERRAL TO OPTOMETRY ( INT)  - Repeat CPE in 1-2 year(s).      (Z12.31) Encounter for screening mammogram for breast cancer  Comment: Routine  Plan: [facility] SCREENING MAMMO BILATERAL DIGITAL WITH DBT &        CAD      Callback precautions discussed and all questions answered.     Drue Flirt, PA-C

## 2020-08-04 ENCOUNTER — Ambulatory Visit (HOSPITAL_BASED_OUTPATIENT_CLINIC_OR_DEPARTMENT_OTHER): Payer: Self-pay | Admitting: Family Medicine

## 2020-08-20 ENCOUNTER — Ambulatory Visit: Payer: No Typology Code available for payment source | Attending: Family Medicine | Admitting: Family Medicine

## 2020-08-20 ENCOUNTER — Other Ambulatory Visit: Payer: Self-pay

## 2020-08-20 VITALS — BP 130/64 | HR 110 | Wt 156.3 lb

## 2020-08-20 DIAGNOSIS — Z124 Encounter for screening for malignant neoplasm of cervix: Secondary | ICD-10-CM | POA: Insufficient documentation

## 2020-08-20 DIAGNOSIS — Z113 Encounter for screening for infections with a predominantly sexual mode of transmission: Secondary | ICD-10-CM | POA: Diagnosis present

## 2020-08-20 DIAGNOSIS — N898 Other specified noninflammatory disorders of vagina: Secondary | ICD-10-CM | POA: Insufficient documentation

## 2020-08-20 DIAGNOSIS — F411 Generalized anxiety disorder: Secondary | ICD-10-CM | POA: Insufficient documentation

## 2020-08-20 MED ORDER — FERROUS SULFATE 325 (65 FE) MG PO TABS
325.00 mg | ORAL_TABLET | ORAL | 0 refills | Status: AC
Start: 2020-08-20 — End: 2020-11-18

## 2020-08-20 MED FILL — FERROUS SULF 325MG: 30 days supply | Qty: 10 | Fill #0

## 2020-08-20 NOTE — Progress Notes (Signed)
S:    This 40 year old female comes to [facility] for PAP, anxiety fup     1.) PAP  Notes intermittent vaginal itching, no other vaginal symptoms   Sexually active:yes  OB Hx: G3P3  Current contraception: Female sterilization (tubal, essure)     Patient's last menstrual period was 07/31/2020.  Cycle pattern: regular      Unprotected intercourse since LMP:N/A  STD Screening today:accepts    Last PAP result:     Normal 2016  Next Pap Due:  today    Family Hx of Breast Ca:No  Mammogram: ordered    2.) Anxiety   - started lexapro 06/30/2020 due to anxiety and stress taking care of sick son and young granddaughter (has been stress eating a little)  - pt is feeling better and says its helping a lot, no side effect concerns   - mood has been good, but pt does get overwhelmed with all of her responsibilities   - feels dose is correct and wishes to continue  - denies SI/HI     Most Recent Weight Reading(s)  08/20/20 : 70.9 kg (156 lb 4.9 oz)  07/31/20 : 70.9 kg (156 lb 3.2 oz)  06/30/20 : 71.7 kg (158 lb)  11/19/18 : 63 kg (139 lb)  06/02/18 : 60.8 kg (134 lb)          Review of Systems   As per HPI     All other systems reviewed and are negative.    Patient Active Problem List:     Adjustment disorder with mixed anxiety and depressed mood     Acute depression     Plantar fasciitis, bilateral     Post traumatic stress disorder (PTSD)     GAD (generalized anxiety disorder)     Neurocysticercosis     Tension headache     History of bariatric surgery     LOC (loss of consciousness) (HCC)     History of seizures as a child     Anemia     Vitamin D deficiency     Family history of glaucoma     H/O abdominoplasty     Sleep disorder     Family history of thyroid disease     Acute gastritis without hemorrhage     Restless legs     Anxiety state    escitalopram (LEXAPRO) 10 MG tablet, Take 1 tablet by mouth daily, Disp: 90 tablet, Rfl: 3  cholecalciferol (VITAMIN D3) 25 MCG (1000 UT) tablet, Take 1 tablet by mouth daily, Disp: 90  tablet, Rfl: 3  Multiple Vitamin (MULTIVITAMIN) TABS, Take 1 tablet by mouth daily, Disp: 30 tablet, Rfl: 11  ergocalciferol (VITAMIN D2) 50000 UNIT capsule, Take 1 capsule by mouth once a week, Disp: 8 capsule, Rfl: 0  MAGNESIUM CITRATE PO, Take by mouth, Disp: , Rfl:   Cyanocobalamin (B-12 PO), Take by mouth, Disp: , Rfl:     No current facility-administered medications for this visit.    Review of Patient's Allergies indicates:  No Known Allergies        O:    BP 130/64 (Site: LA, Position: Sitting)    Pulse 110    Wt 70.9 kg (156 lb 4.9 oz)    LMP 07/31/2020    SpO2 99%    BMI 29.53 kg/m     Physical Exam   Constitutional: BMI 29.53 NAD.  HENT:   Head: Normocephalic and atraumatic.   Skin: Skin is warm and dry.  Neurological: Alert and oriented to person, place, and time. Normal reflexes. No cranial nerve deficit. Normal muscle tone. Sensation intact. Coordination normal. Rapid alternating movements, heel to shin, and romberg all normal.   Psychiatric: Normal mood and affect. Behavior is normal. Judgment and thought content normal. Good eye contact. Dressed and groomed appropriately.   GU: Inspection of external genitalia reveals no lesions, ulcers, or other abnormality. Urethra appears normal. Internal speculum exam reveals normal vaginal tissue with normal discharge. Cervix is without cysts, lesions, erythema, or other abnormality. PAP obtained today without difficulty.     A/P    (Z12.4) Screening for malignant neoplasm of cervix  (primary encounter diagnosis)  Comment: Routine.  Previously normal in 2016.  Plan: CYTOPATH, C/V, THIN LAYER, OBTAINING SCREEN PAP        SMEAR, HUMAN PAPILLOMAVIRUS (HPV)         (Z11.3) Routine screening for STI (sexually transmitted infection)  Comment: Routine  Plan: CHLAMYDIA GC NAAT, HEPATITIS B SURFACE AB         QUANT, HEPATITIS B SURFACE ANTIGEN, HEPATITIS C        ANTIBODY, HIV ANTIGEN ANTIBODY 5TH GEN, RPR,       (N89.8) Vaginal itching  Comment: Mild intermittent  itching. Will check for yeast- no discharge on exam.   Plan: VAGINITIS PANEL PCR,     (F41.1) GAD (generalized anxiety disorder)  Comment: Patient reports good relief with Lexapro 10 mg.  No side effects noted.  No red flags.  Patient wishes to continue at this dose  Plan:   As above    Callback precautions discussed and all questions answered.     On the day of service, I spent 20-29 minutes caring for this patient, including time required for chart review, face-to-face patient evaluation, entering orders, counseling, and documentation of the encounter.    Drue Flirt, PA-C

## 2020-08-21 LAB — HIV ANTIGEN ANTIBODY 5TH GEN: HIVAGAB QUALITATIVE: NONREACTIVE

## 2020-08-21 LAB — VAGINITIS PANEL PCR
BACTERIAL VAGINOSIS: NEGATIVE
CANDIDA GLABRATA: NEGATIVE
CANDIDA KRUSEI: POSITIVE
CANDIDA SPECIES: NEGATIVE
TRICHOMONAS VAGINALIS: NEGATIVE

## 2020-08-21 LAB — HEPATITIS C ANTIBODY: HEPATITIS C ANTIBODY: NONREACTIVE

## 2020-08-21 LAB — HUMAN PAPILLOMAVIRUS (HPV): HUMAN PAPILLOMAVIRUS: NEGATIVE

## 2020-08-21 LAB — RPR: RPR QUAL: NONREACTIVE

## 2020-08-21 LAB — CHLAMYDIA GC NAAT
CHLAMYDIA TRACHOMATIS NAAT: NEGATIVE
NEISSERIA GONORRHOEAE NAAT: NEGATIVE

## 2020-08-21 LAB — HEPATITIS B SURFACE AB QUANT: HEPATITIS B SURFACE AB QUANT: <3.5 m[IU]/mL (ref 0.0–8.4)

## 2020-08-21 LAB — HEPATITIS B SURFACE ANTIGEN: HEPATITIS B SURFACE ANTIGEN: NONREACTIVE

## 2020-08-26 ENCOUNTER — Telehealth (HOSPITAL_BASED_OUTPATIENT_CLINIC_OR_DEPARTMENT_OTHER): Payer: Self-pay | Admitting: Family Medicine

## 2020-08-26 MED ORDER — CLOTRIMAZOLE 2 % VA CREA
1.0000 | TOPICAL_CREAM | Freq: Every evening | VAGINAL | 0 refills | Status: DC
Start: 2020-08-26 — End: 2020-08-27

## 2020-08-26 MED FILL — CLOTRIMAZOLE 3 DAY 2% CRE: 7 days supply | Qty: 21 | Fill #0

## 2020-08-26 NOTE — Telephone Encounter (Signed)
Called pt x2 to review results and left VM    - yeast infection (resistant to oral fluconazole) so will treat topically x 1 week   - due for Hep B vaccination   - no STIs  - PAP still pending, if normal will plan on cotesting in 5 years     Drue Flirt, PA-C

## 2020-08-27 ENCOUNTER — Other Ambulatory Visit (HOSPITAL_BASED_OUTPATIENT_CLINIC_OR_DEPARTMENT_OTHER): Payer: Self-pay | Admitting: Family Medicine

## 2020-08-27 LAB — CYTOPATH, C/V, THIN LAYER

## 2020-08-27 MED ORDER — CLOTRIMAZOLE 1 % VA CREA
1.00 | TOPICAL_CREAM | Freq: Every evening | VAGINAL | 0 refills | Status: AC
Start: 2020-08-27 — End: 2020-09-03

## 2020-08-27 MED FILL — *CLOTRIMAZOLE CRE 1% VAG: 7 days supply | Qty: 45 | Fill #0

## 2020-08-28 ENCOUNTER — Encounter (HOSPITAL_BASED_OUTPATIENT_CLINIC_OR_DEPARTMENT_OTHER): Payer: Self-pay | Admitting: Family Medicine

## 2020-08-28 ENCOUNTER — Ambulatory Visit (HOSPITAL_BASED_OUTPATIENT_CLINIC_OR_DEPARTMENT_OTHER): Payer: Self-pay

## 2020-08-28 ENCOUNTER — Telehealth (HOSPITAL_BASED_OUTPATIENT_CLINIC_OR_DEPARTMENT_OTHER): Payer: Self-pay | Admitting: Family Medicine

## 2020-08-28 DIAGNOSIS — R87610 Atypical squamous cells of undetermined significance on cytologic smear of cervix (ASC-US): Secondary | ICD-10-CM | POA: Insufficient documentation

## 2020-08-28 NOTE — Telephone Encounter (Signed)
Called patient to review Pap smear results and left VM    -ASCUS with HPV negative--we will repeat cotesting in 3 years    Drue Flirt, PA-C

## 2020-09-23 ENCOUNTER — Ambulatory Visit (HOSPITAL_BASED_OUTPATIENT_CLINIC_OR_DEPARTMENT_OTHER): Payer: Self-pay | Admitting: Family Medicine

## 2021-01-27 ENCOUNTER — Other Ambulatory Visit: Payer: Self-pay

## 2021-01-27 ENCOUNTER — Ambulatory Visit: Payer: No Typology Code available for payment source | Attending: Family Medicine | Admitting: Family Medicine

## 2021-01-27 VITALS — BP 130/85 | HR 90 | Temp 98.0°F

## 2021-01-27 DIAGNOSIS — F322 Major depressive disorder, single episode, severe without psychotic features: Secondary | ICD-10-CM | POA: Insufficient documentation

## 2021-01-27 DIAGNOSIS — Z7189 Other specified counseling: Secondary | ICD-10-CM

## 2021-01-27 DIAGNOSIS — Z01818 Encounter for other preprocedural examination: Secondary | ICD-10-CM | POA: Diagnosis present

## 2021-01-27 DIAGNOSIS — B86 Scabies: Secondary | ICD-10-CM | POA: Insufficient documentation

## 2021-01-27 LAB — COMPREHENSIVE METABOLIC PANEL
ALANINE AMINOTRANSFERASE: 9 U/L — ABNORMAL LOW (ref 12–45)
ALBUMIN: 4.2 g/dL (ref 3.4–5.2)
ALKALINE PHOSPHATASE: 81 U/L (ref 45–117)
ANION GAP: 8 mmol/L — ABNORMAL LOW (ref 10–22)
ASPARTATE AMINOTRANSFERASE: 16 U/L (ref 8–34)
BILIRUBIN TOTAL: 0.2 mg/dL (ref 0.2–1.0)
BUN (UREA NITROGEN): 8 mg/dL (ref 7–18)
CALCIUM: 8.9 mg/dL (ref 8.5–10.5)
CARBON DIOXIDE: 26 mmol/L (ref 21–32)
CHLORIDE: 104 mmol/L (ref 98–107)
CREATININE: 0.6 mg/dL (ref 0.4–1.2)
ESTIMATED GLOMERULAR FILT RATE: >60 mL/min (ref >60)
Glucose Random: 93 mg/dL (ref 74–160)
POTASSIUM: 4 mmol/L (ref 3.5–5.1)
SODIUM: 139 mmol/L (ref 136–145)
TOTAL PROTEIN: 6.8 g/dL (ref 6.4–8.2)

## 2021-01-27 LAB — CBC WITH PLATELET
ABSOLUTE NRBC COUNT: 0 10*3/uL (ref 0.0–0.0)
HEMATOCRIT: 33.4 % — ABNORMAL LOW (ref 34.1–44.9)
HEMOGLOBIN: 10.2 g/dL — ABNORMAL LOW (ref 11.2–15.7)
MEAN CORP HGB CONC: 30.5 g/dL — ABNORMAL LOW (ref 31.0–37.0)
MEAN CORPUSCULAR HGB: 27.4 pg (ref 26.0–34.0)
MEAN CORPUSCULAR VOL: 89.8 fl (ref 80.0–100.0)
MEAN PLATELET VOLUME: 11.5 fL (ref 8.7–12.5)
NRBC %: 0 % (ref 0.0–0.0)
PLATELET COUNT: 351 10*3/uL (ref 150–400)
RBC DISTRIBUTION WIDTH STD DEV: 39.4 fL (ref 35.1–46.3)
RED BLOOD CELL COUNT: 3.72 M/uL — ABNORMAL LOW (ref 3.90–5.20)
WHITE BLOOD CELL COUNT: 8.2 10*3/uL (ref 4.0–11.0)

## 2021-01-27 LAB — HEPATITIS B SURFACE AB QUANT: HEPATITIS B SURFACE AB QUANT: <3.5 m[IU]/mL (ref 0.0–8.4)

## 2021-01-27 LAB — HEPATITIS C ANTIBODY: HEPATITIS C ANTIBODY: NONREACTIVE

## 2021-01-27 LAB — APTT: APTT: 30.2 SECONDS (ref 26.9–38.0)

## 2021-01-27 LAB — PROTHROMBIN TIME
INR: 1.1 (ref 2.0–3.5)
PROTHROMBIN TIME: 11.3 SECONDS (ref 9.6–12.3)

## 2021-01-27 LAB — HCG QUANTITATIVE: HCG QUANTITATIVE: <1 m[IU]/mL (ref 0–7)

## 2021-01-27 LAB — HEPATITIS B SURFACE ANTIGEN: HEPATITIS B SURFACE ANTIGEN: NONREACTIVE

## 2021-01-27 MED ORDER — PERMETHRIN 5 % EX CREA
TOPICAL_CREAM | CUTANEOUS | 0 refills | Status: DC
Start: 2021-01-27 — End: 2021-01-27

## 2021-01-27 MED ORDER — PERMETHRIN 5 % EX CREA
TOPICAL_CREAM | CUTANEOUS | 0 refills | Status: AC
Start: 2021-01-27 — End: 2021-02-02

## 2021-01-27 MED FILL — PERMETHRIN  CRE 5%: 5 days supply | Qty: 60 | Fill #0

## 2021-01-29 DIAGNOSIS — F322 Major depressive disorder, single episode, severe without psychotic features: Secondary | ICD-10-CM | POA: Insufficient documentation

## 2021-01-29 DIAGNOSIS — Z9889 Other specified postprocedural states: Secondary | ICD-10-CM | POA: Insufficient documentation

## 2021-01-29 NOTE — Assessment & Plan Note (Signed)
Discussed self care, multiple stressors at home.

## 2021-01-29 NOTE — Progress Notes (Signed)
[facility] CARE CENTER OFFICE VISIT NOTE    NAME: Kimberly Lindsey  MRN: [id number]  DOB: [birth year]  PCP: Kari Baars, MD  _______________________________________________________________________________  ASSESSMENT AND PLAN    Problem List        Unprioritized    Severe major depression [facility])    Current Assessment & Plan     Discussed self care, multiple stressors at home.          Sahar was seen today for follow up.    Diagnoses and all orders for this visit:    Preoperative clearance  Comments:  for upcoming plastic surgery (lipo/skin reduction) in [place]. Low risk for surgery, labs per request.  Orders:  -     EKG  -     CBC WITH PLATELET; Future  -     COMPREHENSIVE METABOLIC PANEL; Future  -     HCG QUANTITATIVE; Future  -     PROTHROMBIN TIME; Future  -     HEPATITIS C ANTIBODY; Future  -     HEPATITIS B SURFACE AB QUANT; Future  -     HEPATITIS B SURFACE ANTIGEN; Future  -     APTT; Future  -     APTT  -     HEPATITIS B SURFACE ANTIGEN  -     HEPATITIS B SURFACE AB QUANT  -     HEPATITIS C ANTIBODY  -     PROTHROMBIN TIME  -     HCG QUANTITATIVE  -     COMPREHENSIVE METABOLIC PANEL  -     CBC WITH PLATELET    Scabies  Comments:  small patches on forearms, itchy with burrowing noted. suspect scabies, permethrin.   Orders:  -     permethrin (ELIMITE) 5 % cream; massage permethrin cream thoroughly into the skin from the neck to the soles of the feet, including areas under the fingernails and toenails, leave on for 8-14 hours then rinse off    Severe major depression (HCC)    Other orders  -     Discontinue: permethrin (ELIMITE) 5 % cream; massagepermethrincream thoroughly into the skin from the neck to the soles of the feet, including areas under the fingernails and toenails, leave on for 8-14 hours then rinse off        HEPATITIS B VACCINE SERIES(1 of 3 - 3-dose series)  COVID-19 Vaccine(1)  MAMMOGRAPHY  INFLUENZA VACCINE(1)  PHQ-9    Follow up due:         I spent a total of 30 minutes on this  visit on the date of service (total time includes all activities performed on the date of service)      Kari Baars, MD  01/29/2021   ________________________________________________________________________________    CHIEF COMPLAINT  Patient presents with:  Follow Up      HISTORY  Kimberly Lindsey is a 40 year old [place]speaking female here for return visit with this physician     In addition, we reviewed  (Z01.818) Preoperative clearance  (primary encounter diagnosis)  (B86) Scabies  (F32.2) Severe major depression (HCC)    ROS  Relevant review of systems was obtained as noted in the HPI    PHYSICAL EXAM  BP 130/85    Pulse 90    Temp 98 F (36.7 C)    SpO2 98%   Pain Score: Data Unavailable        GEN: Well appearing, in no apparent distress. Pt speaking  in full sentences  RESP: normal rate of breathing  NECK: supple, no goiter  NEURO: A&Ox3, gait, normal   PSYCH: normal mood, affect

## 2021-02-01 LAB — EKG

## 2021-02-02 LAB — EKG

## 2021-02-02 NOTE — Addendum Note (Signed)
Addended by: Artis Flock I on: 02/02/2021 09:02 AM     Modules accepted: Orders

## 2021-07-22 ENCOUNTER — Ambulatory Visit (HOSPITAL_BASED_OUTPATIENT_CLINIC_OR_DEPARTMENT_OTHER): Payer: Self-pay | Admitting: Registered Nurse

## 2021-07-22 NOTE — Telephone Encounter (Signed)
Reason for Disposition  . Breast lump    Answer Assessment - Initial Assessment Questions  LMP 5/20-5/22/2023 (always only 2 days)  Pt noticed a painful raised tender to touch pea size lump to the left of her right nipple-no drainage no discharge pt able to gently move the pea size lump-but very tender to touch  No hx of breast lumps  Hx of abnrmrl Pap  Afebrile  No recent illnesses or traumas  Pt does not believe she could be pregnant  sched apt for tomorrow am at [facility] in person  Pt agrees with this plan  Future orders for Mammo 07/31/2021    Protocols used: ADULT BREAST SYMPTOMS-A-OH  Priority: Routine    Advised per nursing triage protocol.  Verbalized understanding and agreement with instructions and disposition.     Recommended disposition for patient:Disposition: See in Office within 3 days    Instructed patient to call back for any new, worsening, or worrisome symptoms or concerns any time day or night.

## 2021-07-22 NOTE — Telephone Encounter (Signed)
Regarding: Menstrual concerns and breast pain with lumps and swelling  ----- Message from Whitman Hero sent at 07/22/2021 12:45 PM EDT -----  Kimberly Lindsey,  [id number],  41 year old,  female  Telephone Information:  Home Phone      [phone]  Work Phone      Not on file.  Mobile          Not on file.    Patient's PCP: Juan Quam, Marcene Brawn, MD    Patient's language of care:   [place]  Caller needs a [place] interpreter.    Person calling on behalf of patient: Patient (self) / Kimberly Lindsey     Pt would like to be seen today. Offered next available appt on 08/09/2021. Pt declined appt.   What are the symptoms: Menstrual concerns and breast pain with lumps and swelling. Patient stated right breast swells and pain during period but lump and pain continues after period.  How long has patient been sick? A month   What has pt tried at home?:  -  Where is the patient located at the moment: [place]: [place]

## 2021-07-23 ENCOUNTER — Other Ambulatory Visit: Payer: Self-pay

## 2021-07-23 ENCOUNTER — Telehealth (HOSPITAL_BASED_OUTPATIENT_CLINIC_OR_DEPARTMENT_OTHER): Payer: Self-pay | Admitting: Family Medicine

## 2021-07-23 ENCOUNTER — Ambulatory Visit: Payer: No Typology Code available for payment source | Attending: Family Medicine | Admitting: Family Medicine

## 2021-07-23 VITALS — BP 116/77 | HR 86 | Temp 98.0°F | Wt 142.2 lb

## 2021-07-23 DIAGNOSIS — Z862 Personal history of diseases of the blood and blood-forming organs and certain disorders involving the immune mechanism: Secondary | ICD-10-CM

## 2021-07-23 DIAGNOSIS — N6312 Unspecified lump in the right breast, upper inner quadrant: Secondary | ICD-10-CM | POA: Diagnosis present

## 2021-07-23 DIAGNOSIS — R7989 Other specified abnormal findings of blood chemistry: Secondary | ICD-10-CM | POA: Diagnosis present

## 2021-07-23 LAB — CBC WITH PLATELET
ABSOLUTE NRBC COUNT: 0 10*3/uL (ref 0.0–0.0)
HEMATOCRIT: 27.2 % — ABNORMAL LOW (ref 34.1–44.9)
HEMOGLOBIN: 7.6 g/dL — ABNORMAL LOW (ref 11.2–15.7)
MEAN CORP HGB CONC: 27.9 g/dL — ABNORMAL LOW (ref 31.0–37.0)
MEAN CORPUSCULAR HGB: 21.2 pg — ABNORMAL LOW (ref 26.0–34.0)
MEAN CORPUSCULAR VOL: 76 fl — ABNORMAL LOW (ref 80.0–100.0)
MEAN PLATELET VOLUME: 10.8 fL (ref 8.7–12.5)
NRBC %: 0 % (ref 0.0–0.0)
PLATELET COUNT: 372 10*3/uL (ref 150–400)
RBC DISTRIBUTION WIDTH STD DEV: 48.3 fL — ABNORMAL HIGH (ref 35.1–46.3)
RED BLOOD CELL COUNT: 3.58 M/uL — ABNORMAL LOW (ref 3.90–5.20)
WHITE BLOOD CELL COUNT: 7.5 10*3/uL (ref 4.0–11.0)

## 2021-07-23 LAB — VITAMIN D,25 HYDROXY: VITAMIN D,25 HYDROXY: 26 ng/mL — ABNORMAL LOW (ref 30.0–100.0)

## 2021-07-23 LAB — TOTAL IRON BINDING CAPACITY: TOTAL IRON BIND CAPACITY CALC: 497 ug/dL (ref 280–504)

## 2021-07-23 LAB — FERRITIN: FERRITIN: 6 ng/mL — ABNORMAL LOW (ref 13–150)

## 2021-07-23 LAB — THYROID SCREEN TSH REFLEX FT4: THYROID SCREEN TSH REFLEX FT4: 1.75 u[IU]/mL (ref 0.270–4.200)

## 2021-07-23 LAB — IRON: IRON: 11 ug/dL — ABNORMAL LOW (ref 50–170)

## 2021-07-23 MED ORDER — FERROUS SULFATE 325 (65 FE) MG PO TABS
325.00 mg | ORAL_TABLET | Freq: Every day | ORAL | 1 refills | Status: AC
Start: 2021-07-23 — End: 2022-01-19

## 2021-07-23 MED ORDER — MELATONIN 3 MG PO TABS
3.00 mg | ORAL_TABLET | Freq: Every evening | ORAL | 3 refills | Status: AC
Start: 2021-07-23 — End: 2022-07-23

## 2021-07-23 MED FILL — MELATONIN 3MG: 90 days supply | Qty: 90 | Fill #0

## 2021-07-23 NOTE — Progress Notes (Signed)
S:    This 41 year old female comes to [facility] for     1.) right breast lump   - noticed a painful lump of right breast, pea sized   -  Started last week when got menses 5/20-5/22/23 and although menses stopped, lump and pain continues  - Painful only with pressure, mild  - Has not tried any medication for pain   - Has never felt a lump in breast before  - Mammo ordered last year, pt did not make appt  - Breast lift surgery (no silicon) and liposuction/butt lift surgery in Dec 2022 in [place], went well- no complications  - denies: fevers, drainage, erythema, unint wt loss, night sweats, family history breast cancer      Review of Systems   As per hpi     All other systems reviewed and are negative.    Patient Active Problem List:     Adjustment disorder with mixed anxiety and depressed mood     Acute depression     Plantar fasciitis, bilateral     Post traumatic stress disorder (PTSD)     GAD (generalized anxiety disorder)     Neurocysticercosis     Tension headache     History of bariatric surgery     LOC (loss of consciousness) (HCC)     History of seizures as a child     Anemia     Vitamin D deficiency     Family history of glaucoma     H/O abdominoplasty     Sleep disorder     Family history of thyroid disease     Acute gastritis without hemorrhage     Restless legs     Anxiety state     Atypical squamous cells of undetermined significance (ASCUS) on Papanicolaou smear of cervix     Severe major depression (HCC)    escitalopram (LEXAPRO) 10 MG tablet, Take 1 tablet by mouth daily, Disp: 90 tablet, Rfl: 3  cholecalciferol (VITAMIN D3) 25 MCG (1000 UT) tablet, Take 1 tablet by mouth daily, Disp: 90 tablet, Rfl: 3  Multiple Vitamin (MULTIVITAMIN) TABS, Take 1 tablet by mouth daily, Disp: 30 tablet, Rfl: 11  ergocalciferol (VITAMIN D2) 50000 UNIT capsule, Take 1 capsule by mouth once a week, Disp: 8 capsule, Rfl: 0  MAGNESIUM CITRATE PO, Take by mouth, Disp: , Rfl:   Cyanocobalamin (B-12 PO), Take by mouth, Disp: ,  Rfl:     No current facility-administered medications for this visit.    Review of Patient's Allergies indicates:  No Known Allergies        O:    BP 116/77   Pulse 86   Temp 98 F (36.7 C)   Wt 64.5 kg (142 lb 3.2 oz)   SpO2 99%   BMI 26.87 kg/m     Physical Exam   Constitutional: BMI 26. NAD.  HENT:   Head: Normocephalic and atraumatic.   Breast exam: examined in upright and supine position- small mass about 1 cm palpated in right breast upper inner quadrant. No changes in skin or nipple changes or axillary nodes, nipples normal without inversion, lesions or discharge, no skin dimpling or peau d'orange.   Musculoskeletal: Normal range of motion. No edema and no tenderness.   Skin: Skin is warm and dry.   Neurological: Alert and oriented to person, place, and time.  Psychiatric: Normal mood and affect. Behavior is normal. Judgment and thought content normal. Good eye contact. Dressed and groomed appropriately.  A/P      (N63.12) Mass of upper inner quadrant of right breast  (primary encounter diagnosis)  Comment: Palpated in right breast. Small about 1 cm hard and mobile. Will plan to get imaging. Pt is also due for mammo, ordered last year. No constitutional symptoms and no family history of breast cancer. May use heating pads and tylenol for pain. Callback precautions discussed.   Plan: [place] DI[facility]NOSTIC MAMMO BILATERAL DIGITAL WITH DBT         & CAD, [place] US BREAST-AXILLA RIGHT      (Z86.2) History of anemia  Comment: Pt notes continued feelings of tiredness likely as she cannot sleep well due to husband's snoring. Pt has been buying OTC iron - not sure which dose. Will plan to check labs today  Plan: CBC WITH PLATELET, FERRITIN, IRON, TOTAL IRON         BINDING CAPACITY, THYROID SCREEN TSH REFLEX FT4      (R79.89) Low vitamin D level  Comment: Monitoring. Pt takes 2000 IU daily   Plan: VITAMIN D,25 HYDROXY      Callback precautions discussed and all questions answered.     On the day of service, I spent  30-39 minutes caring for this patient, including time required for chart review, face-to-face patient evaluation, entering orders, counseling, and documentation of the encounter.    Drue Flirt, PA-C

## 2021-07-23 NOTE — Telephone Encounter (Signed)
Called patient to discuss recent labs  -Severe iron deficiency anemia, hemoglobin 7.6  -Patient confirms she is feeling well, denies any chest pain shortness of breath dizziness but does note more fatigued than usual  -Patient states she had gastric bypass surgery and is wondering about poor absorption  -Patient denies heavy menstrual bleeding, blood in stool or urine  -Discussed that this is around the threshold that we often transfuse and since we are going into a holiday weekend, I will reach out on Tuesday with more information, will discuss with PCP  -Reviewed ER precautions at length and also reminded patient that we do have an on-call physician at all times  -For now, we will start iron therapy and discussed iron rich foods    Madaline Savage, PA-C

## 2021-07-27 ENCOUNTER — Telehealth (HOSPITAL_BASED_OUTPATIENT_CLINIC_OR_DEPARTMENT_OTHER): Payer: Self-pay | Admitting: Family Medicine

## 2021-07-27 DIAGNOSIS — D649 Anemia, unspecified: Secondary | ICD-10-CM

## 2021-07-27 NOTE — Telephone Encounter (Addendum)
Called pt and reviewed plan as discussed with PCP, below. Explained plan below in detail. Once starts IV iron, can stop oral iron. Pt reports she is still feeling well- denies any symptoms, no bleeding. Sent a message for insurance approval and will start process of infusions. Reviewed consent form and pt will sign consent upon arrival. Pt will call with any questions. Pt had tubal ligation surgery 15 years ago.     recommend IV Iron, heme consult, EGD/Colonoscopy (sooner than later, order urgent).      Drue Flirt, PA-C

## 2021-07-28 ENCOUNTER — Telehealth (HOSPITAL_BASED_OUTPATIENT_CLINIC_OR_DEPARTMENT_OTHER): Payer: Self-pay | Admitting: Family Medicine

## 2021-07-28 ENCOUNTER — Encounter (HOSPITAL_BASED_OUTPATIENT_CLINIC_OR_DEPARTMENT_OTHER): Payer: Self-pay

## 2021-07-28 NOTE — Telephone Encounter (Signed)
Hi     Venofer is covered under [employer] benefits.     Ok to schedule appt for patient      Thanks

## 2021-07-28 NOTE — Telephone Encounter (Signed)
-----   Message from Drue Flirt, [place] sent at 07/27/2021  3:10 PM EDT -----  Hi,     Is IV iron covered for pt?     Please advise     Thanks,  Marcelino Duster

## 2021-08-02 ENCOUNTER — Ambulatory Visit (HOSPITAL_BASED_OUTPATIENT_CLINIC_OR_DEPARTMENT_OTHER): Payer: No Typology Code available for payment source

## 2021-08-02 ENCOUNTER — Other Ambulatory Visit: Payer: Self-pay

## 2021-08-02 VITALS — BP 142/66 | HR 96 | Resp 18

## 2021-08-02 DIAGNOSIS — Z9884 Bariatric surgery status: Secondary | ICD-10-CM | POA: Insufficient documentation

## 2021-08-02 DIAGNOSIS — D509 Iron deficiency anemia, unspecified: Secondary | ICD-10-CM | POA: Insufficient documentation

## 2021-08-02 DIAGNOSIS — Z1231 Encounter for screening mammogram for malignant neoplasm of breast: Secondary | ICD-10-CM | POA: Insufficient documentation

## 2021-08-02 MED ORDER — IRON SUCROSE 20 MG/ML IV SOLN
300.00 mg | Freq: Once | INTRAVENOUS | Status: AC
Start: 2021-08-02 — End: 2021-08-02
  Administered 2021-08-02: 300 mg via INTRAVENOUS
  Filled 2021-08-02: qty 15

## 2021-08-02 NOTE — Patient Instructions (Signed)
Ligue para a clínica no caso de dúvidas ou preocupações (seg. a sex., das 8h30 às 16h30) [phone].   Dirija-se ao pronto-socorro ou ligue para o 9-1-1 se precisar de atenção médica urgente.

## 2021-08-02 NOTE — Progress Notes (Signed)
Kimberly Lindsey is a 41 year old patient who arrives to the infusion clinic today for # 1/4 Venofer infusion. Pt denies complaints at this time and appears well on arrival. VSS. PIV inserted per [facility] protocol. Venofer  IV administered per order and [place] protocol, as per [facility], with no noted concerns.  PIV removed per [facility] protocol. See flowsheet and MAR for further details. AVS given to pt, d/c instructions reviewed, pt informed to go to emergency dept/call 9-1-1 for any urgent medical attention. Pt will RTC on 08/09/21 for Venofer #2/4.

## 2021-08-03 ENCOUNTER — Encounter (HOSPITAL_BASED_OUTPATIENT_CLINIC_OR_DEPARTMENT_OTHER): Payer: Self-pay | Admitting: Family Medicine

## 2021-08-03 ENCOUNTER — Other Ambulatory Visit (HOSPITAL_BASED_OUTPATIENT_CLINIC_OR_DEPARTMENT_OTHER): Payer: Self-pay | Admitting: Family Medicine

## 2021-08-03 MED ORDER — VITAMIN D 50 MCG (2000 UT) PO TABS
1.00 | ORAL_TABLET | Freq: Every day | ORAL | 3 refills | Status: AC
Start: 2021-08-03 — End: 2022-08-03

## 2021-08-03 MED FILL — VITAMIN D 50MCG (2000U): 90 days supply | Qty: 90 | Fill #0

## 2021-08-06 ENCOUNTER — Ambulatory Visit (HOSPITAL_BASED_OUTPATIENT_CLINIC_OR_DEPARTMENT_OTHER)
Admission: RE | Admit: 2021-08-06 | Discharge: 2021-08-06 | Disposition: A | Discharge status: Home / Self Care | Payer: No Typology Code available for payment source | Source: Ambulatory Visit

## 2021-08-06 ENCOUNTER — Encounter (HOSPITAL_BASED_OUTPATIENT_CLINIC_OR_DEPARTMENT_OTHER): Payer: Self-pay

## 2021-08-06 ENCOUNTER — Other Ambulatory Visit: Payer: Self-pay

## 2021-08-06 ENCOUNTER — Ambulatory Visit
Admission: RE | Admit: 2021-08-06 | Discharge: 2021-08-06 | Disposition: A | Discharge status: Home / Self Care | Payer: No Typology Code available for payment source | Attending: Family Medicine | Admitting: Family Medicine

## 2021-08-06 DIAGNOSIS — N6001 Solitary cyst of right breast: Secondary | ICD-10-CM

## 2021-08-06 DIAGNOSIS — R922 Inconclusive mammogram: Secondary | ICD-10-CM | POA: Diagnosis not present

## 2021-08-06 DIAGNOSIS — N6312 Unspecified lump in the right breast, upper inner quadrant: Secondary | ICD-10-CM

## 2021-08-09 ENCOUNTER — Other Ambulatory Visit: Payer: Self-pay

## 2021-08-09 ENCOUNTER — Ambulatory Visit (HOSPITAL_BASED_OUTPATIENT_CLINIC_OR_DEPARTMENT_OTHER): Payer: No Typology Code available for payment source | Admitting: Oncology

## 2021-08-09 VITALS — BP 124/75

## 2021-08-09 DIAGNOSIS — D509 Iron deficiency anemia, unspecified: Secondary | ICD-10-CM | POA: Diagnosis not present

## 2021-08-09 DIAGNOSIS — Z9884 Bariatric surgery status: Secondary | ICD-10-CM | POA: Diagnosis present

## 2021-08-09 DIAGNOSIS — Z1231 Encounter for screening mammogram for malignant neoplasm of breast: Secondary | ICD-10-CM | POA: Diagnosis present

## 2021-08-09 MED ORDER — IRON SUCROSE 20 MG/ML IV SOLN
300.00 mg | Freq: Once | INTRAVENOUS | Status: AC
Start: 2021-08-09 — End: 2021-08-09
  Administered 2021-08-09: 300 mg via INTRAVENOUS
  Filled 2021-08-09: qty 15

## 2021-08-09 NOTE — Progress Notes (Signed)
Kimberly Lindsey is a 41 year old female who attended Infusion Room today for planned # 2/4 iron sucrose infusion, appeared well on arrival, no voiced questions or concerns. Vital signs stable, IV inserted as per flowsheet. Venofer 300mg  infused as per [facility] with no noted concerns, pt tolerated well, vitals signs remain stable. Discharge teaching completed, IV removed as per flowsheet, given AVS and aware of next appointments. Discharged patient, patient aware to call if any questions of concerns.

## 2021-08-10 ENCOUNTER — Encounter (HOSPITAL_BASED_OUTPATIENT_CLINIC_OR_DEPARTMENT_OTHER): Payer: Self-pay

## 2021-08-13 ENCOUNTER — Other Ambulatory Visit: Payer: Self-pay

## 2021-08-13 ENCOUNTER — Ambulatory Visit: Payer: No Typology Code available for payment source | Attending: Family Medicine | Admitting: Family Medicine

## 2021-08-13 VITALS — BP 121/78 | HR 79 | Temp 97.0°F

## 2021-08-13 DIAGNOSIS — N6001 Solitary cyst of right breast: Secondary | ICD-10-CM | POA: Insufficient documentation

## 2021-08-13 DIAGNOSIS — D649 Anemia, unspecified: Secondary | ICD-10-CM | POA: Diagnosis present

## 2021-08-13 DIAGNOSIS — Z789 Other specified health status: Secondary | ICD-10-CM | POA: Insufficient documentation

## 2021-08-13 DIAGNOSIS — N926 Irregular menstruation, unspecified: Secondary | ICD-10-CM | POA: Insufficient documentation

## 2021-08-13 NOTE — Progress Notes (Signed)
S:    This 41 year old female comes to [facility] for     1.) breast fup   - pt has right breast lump around 1 o clock,4 cm from nipple, first noticed about a month ago and has stayed the same in size but is becoming less painful   - 07/2021: [place] and mammo, reassuring - a cyst 4 mm was noted at this location on ultrasound   - pt is not in pain, but does note some tenderness -- pain has been improving  - does have history of breast lift 01/2021, no filler/silicon and radiologist wonders if could be related to this   - denies: fevers, drainage, erythema, unint wt loss, night sweats, family history breast cancer    2.) iron infusions  - 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    Patient Active Problem List:     Adjustment disorder with mixed anxiety and depressed mood     Acute depression     Plantar fasciitis, bilateral     Post traumatic stress disorder (PTSD)     GAD (generalized anxiety disorder)     Neurocysticercosis     Tension headache     History of bariatric surgery     LOC (loss of consciousness) (HCC)     History of seizures as a child     Anemia     Vitamin D deficiency     Family history of glaucoma     H/O abdominoplasty     Sleep disorder     Family history of thyroid disease     Acute gastritis without hemorrhage     Restless legs     Anxiety state     Atypical squamous cells of undetermined significance (ASCUS) on Papanicolaou smear of cervix     Severe major  depression (HCC)    cholecalciferol (VITAMIN D3) 2000 UNIT tablet, Take 1 tablet by mouth in the morning., Disp: 90 tablet, Rfl: 3  melatonin 3 MG TABS tablet, Take 1 tablet by mouth nightly, Disp: 90 tablet, Rfl: 3  ferrous sulfate 325 (65 FE) MG tablet, Take 1 tablet by mouth in the morning., Disp: 90 tablet, Rfl: 1  escitalopram (LEXAPRO) 10 MG tablet, Take 1 tablet by mouth daily, Disp: 90 tablet, Rfl: 3  Multiple Vitamin (MULTIVITAMIN) TABS, Take 1 tablet by mouth daily, Disp: 30 tablet, Rfl: 11  ergocalciferol (VITAMIN D2) 50000 UNIT capsule, Take 1 capsule by mouth once a week, Disp: 8 capsule, Rfl: 0  MAGNESIUM CITRATE PO, Take by mouth, Disp: , Rfl:   Cyanocobalamin (B-12 PO), Take by mouth, Disp: , Rfl:     No current facility-administered medications for this visit.    Review of Patient's Allergies indicates:  No Known Allergies  O:    BP 121/78   Pulse 79   Temp 97 F (36.1 C)   SpO2 100%     Physical Exam   Constitutional:  NAD.  HENT:   Head: Normocephalic and atraumatic.   Musculoskeletal: Normal range of motion. No edema and no tenderness.   Skin: Skin is warm and dry.   Neurological: Alert and oriented to person, place, and time.   Psychiatric: Normal mood and affect. Behavior is normal. Judgment and thought content normal. Good eye contact. Dressed and groomed appropriately.     A/P          (N60.01) Breast cyst, right  (primary encounter diagnosis)  Comment: Discussed reassuring results from recent imaging. Pt reports pain is improved significantly.  Plan:   Recommend heating pads, Tylenol as needed for pain  If symptoms continue, recommend referral to breast center-patient will reach out if she would like to pursue    (D64.9) Anemia, unspecified type  Comment: Secondary to malabsorption status post gastric bypass.  Patient is feeling much improved on IV iron therapy.  Discussed importance of balanced diet heavy and leafy green vegetables and lean proteins for iron sources.  We will  monitor labs in about a month and again about 3 months later to make sure anemia does not resume.  Patient denies any heavy sources of bleeding at this time-menses are light and irregular, likely perimenopause.  Patient would like to get labs done.  Plan: CBC WITH PLATELET, FERRITIN, IRON, TOTAL IRON         BINDING CAPACITY, FOLATE, VITAMIN B12         (N92.6) Irregular menses  Comment: As above likely perimenopausal  Plan: FOLLICLE STIMULATING HORMONE, PROLACTIN    (Z78.9) Patient has healthcare proxy  Comment: Routine  Plan: HEALTH CARE PROXY      Callback precautions discussed and all questions answered.     On the day of service, I spent 30-39 minutes caring for this patient, including time required for chart review, face-to-face patient evaluation, entering orders, counseling, and documentation of the encounter.    Drue Flirt, PA-C

## 2021-08-16 ENCOUNTER — Other Ambulatory Visit: Payer: Self-pay

## 2021-08-16 ENCOUNTER — Ambulatory Visit (HOSPITAL_BASED_OUTPATIENT_CLINIC_OR_DEPARTMENT_OTHER): Payer: No Typology Code available for payment source | Admitting: Oncology

## 2021-08-16 VITALS — BP 111/73

## 2021-08-16 DIAGNOSIS — Z1231 Encounter for screening mammogram for malignant neoplasm of breast: Secondary | ICD-10-CM | POA: Diagnosis present

## 2021-08-16 DIAGNOSIS — Z9884 Bariatric surgery status: Secondary | ICD-10-CM | POA: Diagnosis present

## 2021-08-16 DIAGNOSIS — D509 Iron deficiency anemia, unspecified: Secondary | ICD-10-CM

## 2021-08-16 MED ORDER — IRON SUCROSE 20 MG/ML IV SOLN
300.00 mg | Freq: Once | INTRAVENOUS | Status: AC
Start: 2021-08-16 — End: 2021-08-16
  Administered 2021-08-16: 300 mg via INTRAVENOUS
  Filled 2021-08-16: qty 15

## 2021-08-16 NOTE — Progress Notes (Signed)
Kimberly Lindsey is a 41 year old female who attended Infusion Room today for planned # 3/4 iron sucrose infusion, appeared well on arrival, no voiced questions or concerns. Vital signs stable, IV inserted as per flowsheet. Venofer 300mg  infused as per [facility] with no noted concerns, pt tolerated well, vitals signs remain stable. Discharge teaching completed, IV removed as per flowsheet, given AVS and aware of next appointments. Discharged patient, patient aware to call if any questions of concerns.

## 2021-08-16 NOTE — Patient Instructions (Signed)
Please call if any questions or concerns. Go the the Emergency Room or call 9-1-1 if you need urgent medical attention.

## 2021-08-24 ENCOUNTER — Ambulatory Visit (HOSPITAL_BASED_OUTPATIENT_CLINIC_OR_DEPARTMENT_OTHER): Payer: No Typology Code available for payment source

## 2021-08-26 ENCOUNTER — Other Ambulatory Visit: Payer: Self-pay

## 2021-08-26 ENCOUNTER — Encounter (HOSPITAL_BASED_OUTPATIENT_CLINIC_OR_DEPARTMENT_OTHER): Payer: Self-pay | Admitting: Family Medicine

## 2021-08-26 ENCOUNTER — Ambulatory Visit: Payer: No Typology Code available for payment source | Attending: Family Medicine | Admitting: Registered Nurse

## 2021-08-26 VITALS — BP 123/79 | HR 84 | Temp 97.4°F | Resp 14

## 2021-08-26 DIAGNOSIS — Z9884 Bariatric surgery status: Secondary | ICD-10-CM

## 2021-08-26 DIAGNOSIS — D509 Iron deficiency anemia, unspecified: Secondary | ICD-10-CM

## 2021-08-26 DIAGNOSIS — Z1231 Encounter for screening mammogram for malignant neoplasm of breast: Secondary | ICD-10-CM | POA: Diagnosis present

## 2021-08-26 MED ORDER — IRON SUCROSE 20 MG/ML IV SOLN
300.00 mg | Freq: Once | INTRAVENOUS | Status: AC
Start: 2021-08-26 — End: 2021-08-26
  Administered 2021-08-26: 300 mg via INTRAVENOUS
  Filled 2021-08-26: qty 15

## 2021-08-26 NOTE — Progress Notes (Signed)
Patient present to  Infusion Room today for planned dose number 4 of 4 doses of iron sucrose infusion, appeared well on arrival, no voiced questions or concerns. Vital signs stable, IV inserted per flowsheet. Venofer 300mg  infused as per [facility] with no noted concerns, pt tolerated well, vitals signs remain stable. Discharge teaching completed, IV removed as per flowsheet.  Pt also for lab draw - not scheduled at this time; will await for clarification of when this should be done - message sent to [facility], [place]. Discharged patient, patient aware to call if any questions of concerns.

## 2021-08-26 NOTE — Patient Instructions (Signed)
Ligue para a clínica no caso de dúvidas ou preocupações (seg. a sex., das 8h30 às 16h30) [phone].   Dirija-se ao pronto-socorro ou ligue para o 9-1-1 se precisar de atenção médica urgente.

## 2021-09-13 ENCOUNTER — Other Ambulatory Visit: Payer: Self-pay

## 2021-09-13 ENCOUNTER — Encounter (HOSPITAL_BASED_OUTPATIENT_CLINIC_OR_DEPARTMENT_OTHER): Payer: Self-pay | Admitting: Family Medicine

## 2021-09-13 ENCOUNTER — Ambulatory Visit: Payer: No Typology Code available for payment source | Attending: Family Medicine | Admitting: Family Medicine

## 2021-09-13 VITALS — BP 108/69 | HR 79 | Temp 97.5°F | Wt 140.0 lb

## 2021-09-13 DIAGNOSIS — N926 Irregular menstruation, unspecified: Secondary | ICD-10-CM | POA: Insufficient documentation

## 2021-09-13 DIAGNOSIS — D649 Anemia, unspecified: Secondary | ICD-10-CM | POA: Insufficient documentation

## 2021-09-13 LAB — VITAMIN B12: VITAMIN B12: 584 pg/mL (ref 232–1245)

## 2021-09-13 LAB — CBC WITH PLATELET
ABSOLUTE NRBC COUNT: 0 10*3/uL (ref 0.0–0.0)
HEMATOCRIT: 35.4 % (ref 34.1–44.9)
HEMOGLOBIN: 11 g/dL — ABNORMAL LOW (ref 11.2–15.7)
MEAN CORP HGB CONC: 31.1 g/dL (ref 31.0–37.0)
MEAN CORPUSCULAR HGB: 28 pg (ref 26.0–34.0)
MEAN CORPUSCULAR VOL: 90.1 fl (ref 80.0–100.0)
MEAN PLATELET VOLUME: 10.8 fL (ref 8.7–12.5)
NRBC %: 0 % (ref 0.0–0.0)
PLATELET COUNT: 254 10*3/uL (ref 150–400)
RBC DISTRIBUTION WIDTH STD DEV: 68.7 fL — ABNORMAL HIGH (ref 35.1–46.3)
RED BLOOD CELL COUNT: 3.93 M/uL (ref 3.90–5.20)
WHITE BLOOD CELL COUNT: 5.9 10*3/uL (ref 4.0–11.0)

## 2021-09-13 LAB — IRON: IRON: 83 ug/dL (ref 50–170)

## 2021-09-13 LAB — FOLATE: FOLATE: 13.4 ng/mL (ref 4.6–?)

## 2021-09-13 LAB — FOLLICLE STIMULATING HORMONE: FOLLICLE STIMULATING HORMONE: 5.6 m[IU]/mL

## 2021-09-13 LAB — PROLACTIN: PROLACTIN: 6.1 ng/mL (ref 4.8–23.3)

## 2021-09-13 LAB — FERRITIN: FERRITIN: 217 ng/mL — ABNORMAL HIGH (ref 13–150)

## 2021-09-13 LAB — TOTAL IRON BINDING CAPACITY: TOTAL IRON BIND CAPACITY CALC: 311 ug/dL (ref 280–504)

## 2021-09-13 NOTE — Progress Notes (Signed)
S:    This 41 year old female comes to [facility] ([place] for     1.) anemia fup   - pt is feeling much better, much less tired  - able to do everything she needs to do during the day  - sleeping much better at night now too and feels well rested   - no more palpitations   - pt completed 4 IV iron infusions   - diet rich in leafy freens  - would like to check blood levels    Review of Systems   As per hpi     All other systems reviewed and are negative.    Patient Active Problem List:     Adjustment disorder with mixed anxiety and depressed mood     Acute depression     Plantar fasciitis, bilateral     Post traumatic stress disorder (PTSD)     GAD (generalized anxiety disorder)     Neurocysticercosis     Tension headache     History of bariatric surgery     LOC (loss of consciousness) (HCC)     History of seizures as a child     Anemia     Vitamin D deficiency     Family history of glaucoma     H/O abdominoplasty     Sleep disorder     Family history of thyroid disease     Acute gastritis without hemorrhage     Restless legs     Anxiety state     Atypical squamous cells of undetermined significance (ASCUS) on Papanicolaou smear of cervix     Severe major depression (HCC)    cholecalciferol (VITAMIN D3) 2000 UNIT tablet, Take 1 tablet by mouth in the morning., Disp: 90 tablet, Rfl: 3  melatonin 3 MG TABS tablet, Take 1 tablet by mouth nightly, Disp: 90 tablet, Rfl: 3  ferrous sulfate 325 (65 FE) MG tablet, Take 1 tablet by mouth in the morning., Disp: 90 tablet, Rfl: 1  escitalopram (LEXAPRO) 10 MG tablet, Take 1 tablet by mouth daily, Disp: 90 tablet, Rfl: 3  Multiple Vitamin (MULTIVITAMIN) TABS, Take 1 tablet by mouth daily, Disp: 30 tablet, Rfl: 11  ergocalciferol (VITAMIN D2) 50000 UNIT capsule, Take 1 capsule by mouth once a week, Disp: 8 capsule, Rfl: 0  MAGNESIUM CITRATE PO, Take by mouth, Disp: , Rfl:   Cyanocobalamin (B-12 PO), Take by mouth, Disp: , Rfl:     No current facility-administered medications for this  visit.    Review of Patient's Allergies indicates:  No Known Allergies        O:    BP 108/69   Pulse 79   Temp 97.5 F (36.4 C)   Wt 63.5 kg (140 lb)   SpO2 100%   BMI 26.45 kg/m     Physical Exam   Constitutional: BMI 26.45 NAD.  HENT:   Head: Normocephalic and atraumatic.   Skin: Skin is warm and dry.   Neurological: Alert and oriented to person, place, and time.  Psychiatric: Normal mood and affect. Behavior is normal. Judgment and thought content normal. Good eye contact. Dressed and groomed appropriately.     A/P    (D64.9) Anemia, unspecified type  (primary encounter diagnosis)  Comment: Pt feeling much improved after receiving 4 IV iron infusions. No more symptoms. Will monitor. Encouraged iron rich diet. May need to continue infusions   Plan: VITAMIN B12, FOLATE, TOTAL IRON BINDING         CAPACITY,  IRON, FERRITIN, CBC WITH PLATELET        (N92.6) Irregular menses  Comment: ?perimenopausal. TSH recently checked and WNL  Plan: PROLACTIN, FOLLICLE STIMULATING HORMONE         Callback precautions discussed and all questions answered.     On the day of service, I spent 20-29 minutes caring for this patient, including time required for chart review, face-to-face patient evaluation, entering orders, counseling, and documentation of the encounter.    Drue Flirt, PA-C

## 2021-09-14 ENCOUNTER — Telehealth (HOSPITAL_BASED_OUTPATIENT_CLINIC_OR_DEPARTMENT_OTHER): Payer: Self-pay

## 2021-09-14 ENCOUNTER — Other Ambulatory Visit (HOSPITAL_BASED_OUTPATIENT_CLINIC_OR_DEPARTMENT_OTHER): Payer: Self-pay | Admitting: Family Medicine

## 2021-09-14 DIAGNOSIS — D649 Anemia, unspecified: Secondary | ICD-10-CM

## 2021-09-14 NOTE — Telephone Encounter (Signed)
Call placed to pt as requested by provider with [facility] [place] interpreter  Badger, Sonny Masters, PA-C  [facility] Rn Pool  Please review plan below with pt thanks!   She will be due for labs in 4 mos thanks          Previous Messages      ----- Message -----   From: Kari Baars, MD   Sent: 09/14/2021  8:59 AM EDT   To: Drue Flirt, PA-C     Great!   Honestly, Ive never done a maitenance dose for iron deficiency, but its an interesting idea, I might consider in that case rechecking in 4-6 months where she is at, and then if still not maintaining her hemoglobin, thinking about referring to heme.onc for eval/recommendations. Tele or e consult would be fine for that.   ----- Message -----   From: Drue Flirt, PA-C   Sent: 09/14/2021  8:43 AM EDT   To: Kari Baars, MD    Voicemail reached  [facility] with clinic contact information requesting call back  Routing to RN pool to attempt to outreach again    Luisa Hart, RN

## 2021-09-15 NOTE — Telephone Encounter (Signed)
Second attempt to contact pt with [place] interpreter   Pt answered and verbalizes understanding of results  Will call back to schedule lab appt in 4 months, schedule is not open yet   Pt with no further questions or concerns at this time    Luisa Hart, RN

## 2022-01-18 ENCOUNTER — Ambulatory Visit: Payer: No Typology Code available for payment source | Admitting: Family Medicine

## 2022-01-18 ENCOUNTER — Encounter (HOSPITAL_BASED_OUTPATIENT_CLINIC_OR_DEPARTMENT_OTHER): Payer: Self-pay | Admitting: Family Medicine

## 2022-01-18 ENCOUNTER — Other Ambulatory Visit: Payer: Self-pay

## 2022-01-18 VITALS — BP 132/80 | HR 68 | Temp 97.8°F | Wt 145.0 lb

## 2022-01-18 DIAGNOSIS — R6889 Other general symptoms and signs: Secondary | ICD-10-CM | POA: Insufficient documentation

## 2022-01-18 DIAGNOSIS — N6322 Unspecified lump in the left breast, upper inner quadrant: Secondary | ICD-10-CM | POA: Insufficient documentation

## 2022-01-18 DIAGNOSIS — D649 Anemia, unspecified: Secondary | ICD-10-CM | POA: Insufficient documentation

## 2022-01-18 DIAGNOSIS — Z9889 Other specified postprocedural states: Secondary | ICD-10-CM | POA: Diagnosis present

## 2022-01-18 DIAGNOSIS — R61 Generalized hyperhidrosis: Secondary | ICD-10-CM | POA: Insufficient documentation

## 2022-01-18 DIAGNOSIS — F322 Major depressive disorder, single episode, severe without psychotic features: Secondary | ICD-10-CM | POA: Diagnosis present

## 2022-01-18 LAB — CBC WITH PLATELET
ABSOLUTE NRBC COUNT: 0 10*3/uL (ref 0.0–0.0)
HEMATOCRIT: 41.8 % (ref 34.1–44.9)
HEMOGLOBIN: 13.3 g/dL (ref 11.2–15.7)
MEAN CORP HGB CONC: 31.8 g/dL (ref 31.0–37.0)
MEAN CORPUSCULAR HGB: 31.4 pg (ref 26.0–34.0)
MEAN CORPUSCULAR VOL: 98.6 fl (ref 80.0–100.0)
MEAN PLATELET VOLUME: 11.1 fL (ref 8.7–12.5)
NRBC %: 0 % (ref 0.0–0.0)
PLATELET COUNT: 276 10*3/uL (ref 150–400)
RBC DISTRIBUTION WIDTH STD DEV: 42.1 fL (ref 35.1–46.3)
RED BLOOD CELL COUNT: 4.24 M/uL (ref 3.90–5.20)
WHITE BLOOD CELL COUNT: 6.5 10*3/uL (ref 4.0–11.0)

## 2022-01-18 LAB — TOTAL IRON BINDING CAPACITY: TOTAL IRON BIND CAPACITY CALC: 381 ug/dL (ref 280–504)

## 2022-01-18 LAB — FERRITIN: FERRITIN: 144 ng/mL (ref 13–150)

## 2022-01-18 LAB — FOLLICLE STIMULATING HORMONE: FOLLICLE STIMULATING HORMONE: 5.2 m[IU]/mL

## 2022-01-18 LAB — THYROID SCREEN TSH REFLEX FT4: THYROID SCREEN TSH REFLEX FT4: 1.14 u[IU]/mL (ref 0.270–4.200)

## 2022-01-18 LAB — IRON: IRON: 88 ug/dL (ref 50–170)

## 2022-01-18 NOTE — Progress Notes (Signed)
Subjective     Kimberly Lindsey is a 41 year old female presents for follow up.     Fatigue/?Anemia?  She has been feeling fatigue and pain and then she was treated for anemia and was advised to return to follow up. She has recently been feeling tired again. Thinks the anemia is back.     Breast lump  She also checked a lump in her R breast in 07/2021. She had a mammogram, and now she has a new lump on L breast and some redness overlying it.     ?Perimenopause  She has ben having alternating hot and cold, wakes up in the middle of the night with sweats, thinks she is entering menopause. tHis happens every night when she goes to sleep just. No palpitations. No fevers. No cough. No weight loss. She just feels difficulty sleeping at night. Mother started with menopause symptoms around age 69 and her aunt around age 39.     Depression:   She is feeling much better now. She is no longer taking antidepressant    ROS: No fevers or weight loss. No headaches, double or blurry vision. No cough or shortness of breath. No chest pain or palpitations. No nausea, vomiting or abdominal pain. No dysuria, hematuria. No swelling in extremities. No rashes.    Social History     Socioeconomic History    Marital status: Single     Spouse name: Not on file    Number of children: Not on file    Years of education: Not on file    Highest education level: Not on file   Occupational History    Not on file   Tobacco Use    Smoking status: Never    Smokeless tobacco: Never   Substance and Sexual Activity    Alcohol use: Yes     Comment: occasionally     Drug use: No    Sexual activity: Yes     Partners: Male     Comment: H/O abnormal pap after her last C/S, S/P cauterization, normal since then.  No H/O STDs.   Other Topics Concern    Not on file   Social History Narrative    Lives with the father of her third son - and son        2 other sons are in [place] - ages 54, 50 - live with her mom    Ex husband left her - is not supporting 2 sons - told  her she was on her own.        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        Lives in [place] with husband and son.     2 kids in [place]--> came to [place] 2016 to visit.        06/24/15:    Lives with sons: 64, 36, 36 yo and boyfriend. Feels safe at home    Works as housecleaner        07/2020: Pt lives in [place] with husband and 3 kids, 1 granddaughter.     Was working as cleaner    Is primary caregiver for 75 year old granddaughter.     Feels safe at home.    Social Determinants of Health  Financial Resource Strain: Not on file  Food Insecurity: Not on file  Transportation Needs: Not on file  Physical Activity: Not on file  Stress: Not on file  Social Connections: Not  on file  Intimate Partner Violence: Not on file  Housing Stability: Not on file  Patient Active Problem List:     Adjustment disorder with mixed anxiety and depressed mood     Acute depression     Plantar fasciitis, bilateral     Post traumatic stress disorder (PTSD)     GAD (generalized anxiety disorder)     Neurocysticercosis     Tension headache     History of bariatric surgery     LOC (loss of consciousness) (HCC)     History of seizures as a child     Anemia     Vitamin D deficiency     Family history of glaucoma     H/O abdominoplasty     Sleep disorder     Family history of thyroid disease     Acute gastritis without hemorrhage     Restless legs     Anxiety state     Atypical squamous cells of undetermined significance (ASCUS) on Papanicolaou smear of cervix     Severe major depression (HCC)    Past Surgical History:  No date: EXCISION SKIN ABD INFRAUMBILICAL PANNICULECTOMY  02/2016: GASTRIC BYPASS; N/A      Comment:  Plain medical   No date: MASTOPEXY  No date: OB ANTEPARTUM CARE CESAREAN DLVR & POSTPARTUM      Comment:  c/s x 3  No date: TUBAL LIGATION  Review of patient's family history indicates:  Problem: Hypertension      Relation: Mother          Age of Onset: (Not Specified)  Problem: Glaucoma       Relation: Mother          Age of Onset: (Not Specified)  Problem: Diabetes      Relation: Father          Age of Onset: (Not Specified)          Comment: died of diabetes, also cholesterol  Problem: Lipids      Relation: Mother          Age of Onset: (Not Specified)  Problem: Diabetes      Relation: Mother          Age of Onset: (Not Specified)  Problem: Thyroid      Relation: Maternal Aunt          Age of Onset: (Not Specified)          Comment: multiple aunts/uncles  Problem: Glaucoma      Relation: Maternal Aunt          Age of Onset: (Not Specified)  Problem: Stroke      Relation: Maternal Grandmother          Age of Onset: (Not Specified)          Comment: died of stroke  Problem: Heart      Relation: Maternal Uncle          Age of Onset: 5          Comment: MI  Problem: Glaucoma      Relation: Maternal Uncle          Age of Onset: (Not Specified)  Problem: Glaucoma      Relation: Maternal Grandfather          Age of Onset: (Not Specified)  Problem: Cancer - Other      Relation: FamHxNeg          Age of Onset: (Not Specified)  cholecalciferol (VITAMIN D3) 2000 UNIT tablet, Take 1 tablet by mouth in the morning., Disp: 90 tablet, Rfl: 3  melatonin 3 MG TABS tablet, Take 1 tablet by mouth nightly, Disp: 90 tablet, Rfl: 3  ferrous sulfate 325 (65 FE) MG tablet, Take 1 tablet by mouth in the morning., Disp: 90 tablet, Rfl: 1  escitalopram (LEXAPRO) 10 MG tablet, Take 1 tablet by mouth daily, Disp: 90 tablet, Rfl: 3  Multiple Vitamin (MULTIVITAMIN) TABS, Take 1 tablet by mouth daily, Disp: 30 tablet, Rfl: 11  ergocalciferol (VITAMIN D2) 50000 UNIT capsule, Take 1 capsule by mouth once a week, Disp: 8 capsule, Rfl: 0  MAGNESIUM CITRATE PO, Take by mouth, Disp: , Rfl:   Cyanocobalamin (B-12 PO), Take by mouth, Disp: , Rfl:     No current facility-administered medications for this visit.    Review of Patient's Allergies indicates:  No Known Allergies           Objective     BP 132/80   Pulse 68   Temp 97.8 F  (36.6 C)   Wt 65.8 kg (145 lb)   LMP 01/12/2022   SpO2 99%   BMI 27.40 kg/m       Physical Exam:  Gen: Normal Appearance, no acute distress  HEENT: normal thyroid, no masses  CARD: RRR, no M/R/G  Pulm: no respiratory distress, CTAB, no W/R/R  Breasts: normal without suspicious masses, scarring noted from prior breast reduction surgery. Breast lump L breast palpated on inner upper quadrant, at 9-11 o clock, about 2cm in diameter, mobile,   No nipple changes or axillary nodes, no skin dimpling or peau d'orange.    Psych: cooperative pleasant  Skin: warm dry  Pulm: no respiratory distress  Neuro: nl station and gait, non-focal exam normal            Assessment   Assessment and Plan:    1. Anemia, unspecified type  Pt with fatigue, cold sensitivity and h/o anemia, rechecking anemia labs  - TOTAL IRON BINDING CAPACITY  - IRON  - FERRITIN  - CBC WITH PLATELET  - THYROID SCREEN TSH REFLEX FT4; Future  - THYROID SCREEN TSH REFLEX FT4    2. Severe major depression (HCC)  Stable per patient continue to folow     3. Cold intolerance    - THYROID SCREEN TSH REFLEX FT4; Future  - THYROID SCREEN TSH REFLEX FT4    4. Night sweats  Given feeling of sweating at night, obtaining labs as below. No fevers to suggest tb. Motehr did go through menopause around her age so checking FSH as well   - QUANTIFERON-TB GOLD PLUS; Future  - FOLLICLE STIMULATING HORMONE; Future  - FOLLICLE STIMULATING HORMONE  - QUANTIFERON-TB GOLD PLUS    5. Mass of upper inner quadrant of left breast  . H/O bilateral breast reduction surgery  Given feeling of breast lump will repeat [place] L breast   - [place] US BREAST-AXILLA LEFT; Future                   Rosilyn Mings, MD

## 2022-01-24 ENCOUNTER — Ambulatory Visit
Admission: RE | Admit: 2022-01-24 | Discharge: 2022-01-24 | Disposition: A | Discharge status: Home / Self Care | Payer: No Typology Code available for payment source | Attending: Diagnostic Radiology | Admitting: Diagnostic Radiology

## 2022-01-24 ENCOUNTER — Other Ambulatory Visit: Payer: Self-pay

## 2022-01-24 ENCOUNTER — Encounter (HOSPITAL_BASED_OUTPATIENT_CLINIC_OR_DEPARTMENT_OTHER): Payer: Self-pay

## 2022-01-24 ENCOUNTER — Encounter (HOSPITAL_BASED_OUTPATIENT_CLINIC_OR_DEPARTMENT_OTHER): Payer: Self-pay | Admitting: Family Medicine

## 2022-01-24 DIAGNOSIS — N6322 Unspecified lump in the left breast, upper inner quadrant: Secondary | ICD-10-CM

## 2022-01-24 DIAGNOSIS — Z9889 Other specified postprocedural states: Secondary | ICD-10-CM

## 2022-01-24 DIAGNOSIS — N6489 Other specified disorders of breast: Secondary | ICD-10-CM | POA: Diagnosis present

## 2022-01-24 LAB — QUANTIFERON-TB GOLD PLUS
QFT PLUS INTERPRETATION: NEGATIVE
QFT PLUS MITOGEN MINUS NIL: 8.709 IU/mL
QFT PLUS NIL VALUE: 0.035 IU/mL
QFT PLUS TB AG1 MINUS NIL: 0.02 IU/mL
QFT PLUS TB AG2 MINUS NIL: 0.012 IU/mL

## 2023-05-15 IMAGING — MR RM Mamas
12 of 41 series · 12 of 41 positions shown · IV contrast (CONTRASTE)
Comparison: none

[Series 3001: t1_(person_name)3(person_name)_tra_vibe_pre · axial · B · 1.0mm · 0.40mm/px · 1 of 174 slices shown]
[im 1/174]
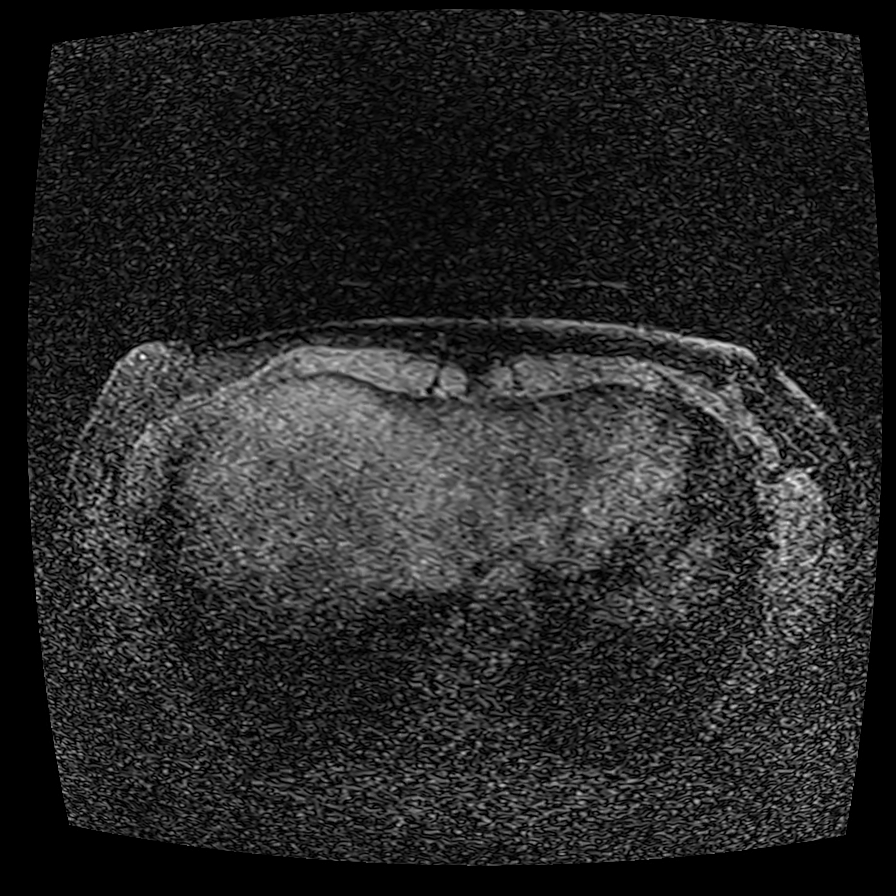

[Series 5003: t1_twist_tra_dyn · axial · B · 2.5mm · 0.94mm/px · 1 of 72 slices shown (1 of 4)]
[im 1/72]
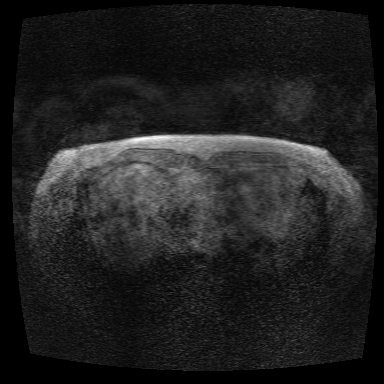

[Series 5006: t1_twist_tra_dyn · axial · B · 2.5mm · 0.94mm/px · 1 of 72 slices shown (2 of 4)]
[im 1/72]
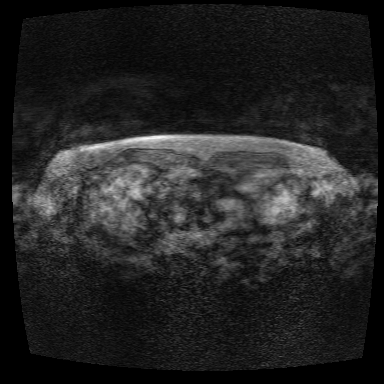

[Series 5009: t1_twist_tra_dyn · axial · B · 2.5mm · 0.94mm/px · 1 of 72 slices shown (3 of 4)]
[im 1/72]
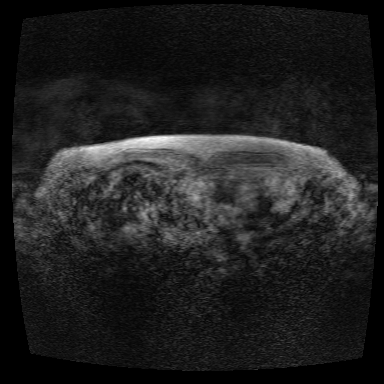

[Series 5013: t1_twist_tra_dyn · axial · B · 2.5mm · 0.94mm/px · 1 of 72 slices shown (4 of 4)]
[im 1/72]
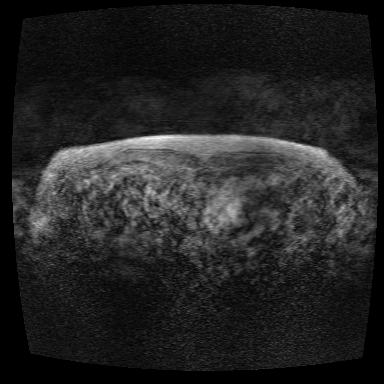

[Series 6003: t1_twist_tra_dyn_sub · axial · B · 2.5mm · 0.94mm/px · 1 of 72 slices shown (1 of 3)]
[im 1/72]
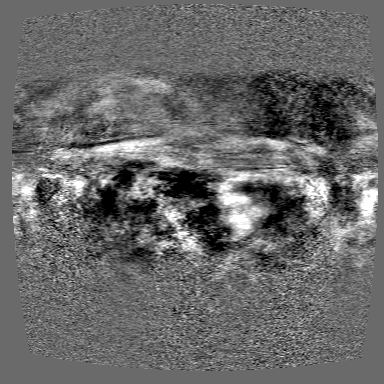

[Series 6007: t1_twist_tra_dyn_sub · axial · B · 2.5mm · 0.94mm/px · 1 of 72 slices shown (2 of 3)]
[im 1/72]
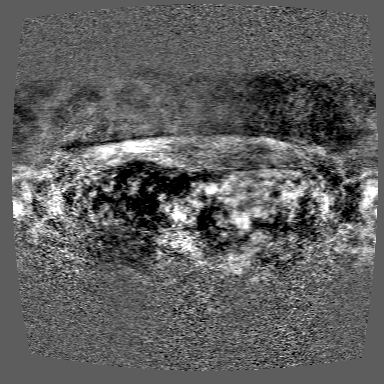

[Series 6010: t1_twist_tra_dyn_sub · axial · B · 2.5mm · 0.94mm/px · 1 of 72 slices shown (3 of 3)]
[im 1/72]
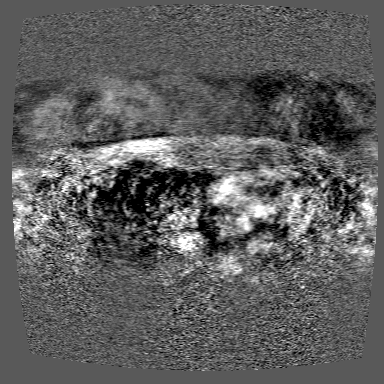

[Series 8001: t1_(person_name)3(person_name)_tra_vibe_pos · axial · B · 1.0mm · 0.40mm/px · 1 of 174 slices shown]
[im 1/174]
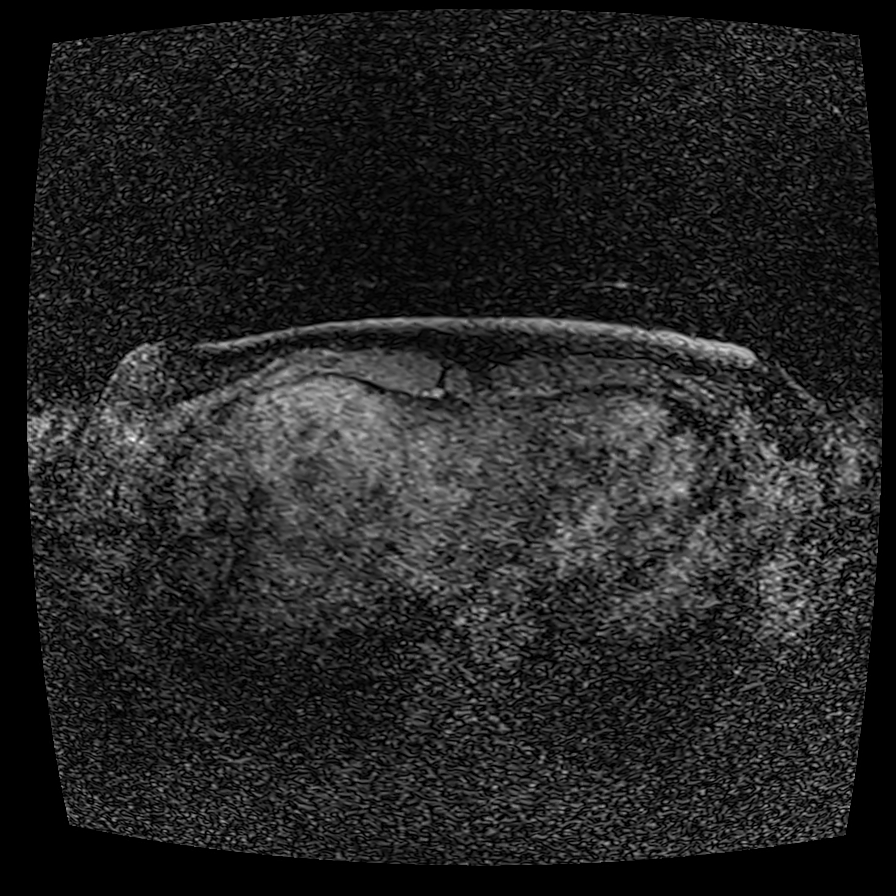

[ep2d_diff_spair_tra_tracew · axial · B · 4.0mm · 1.12mm/px · 1 of 36 slices shown]
[im 1/36]
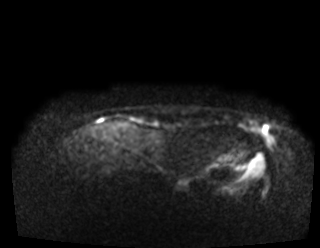

[t1_(person_name)3(person_name)_tra_vibe_tardio · axial · B · 1.0mm · 0.40mm/px · 1 of 174 slices shown]
[im 1/174]
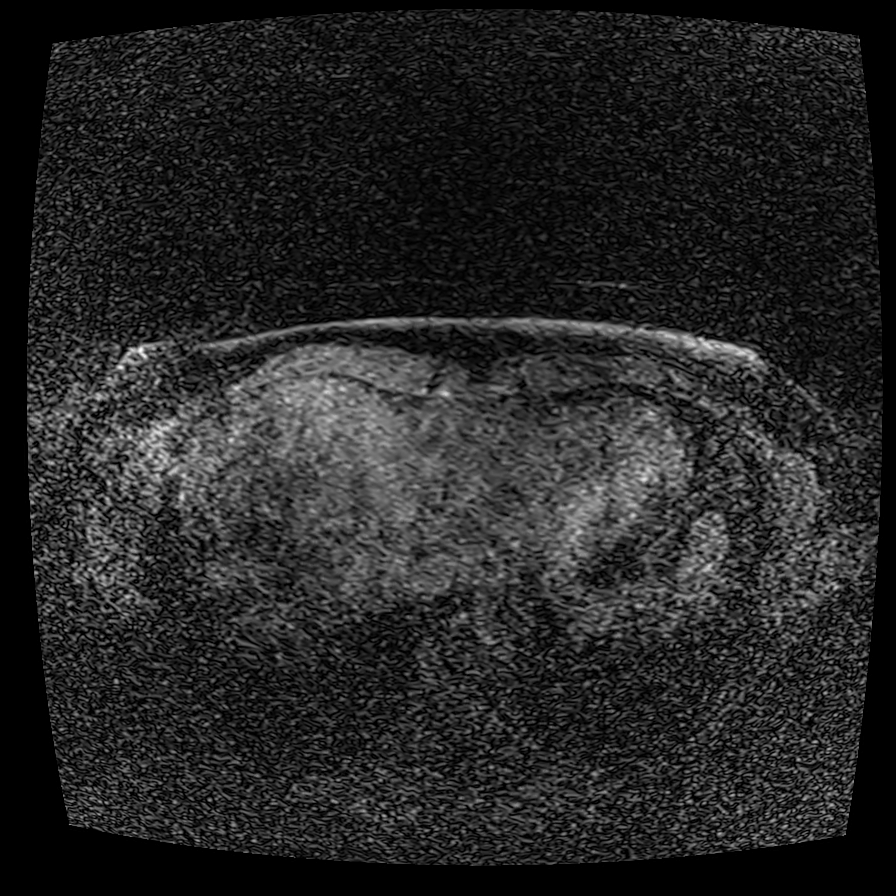

[sag mama direita · sagittal · B · 0.4mm · 0.20mm/px · 1 of 12 slices shown]
[im 1/12]
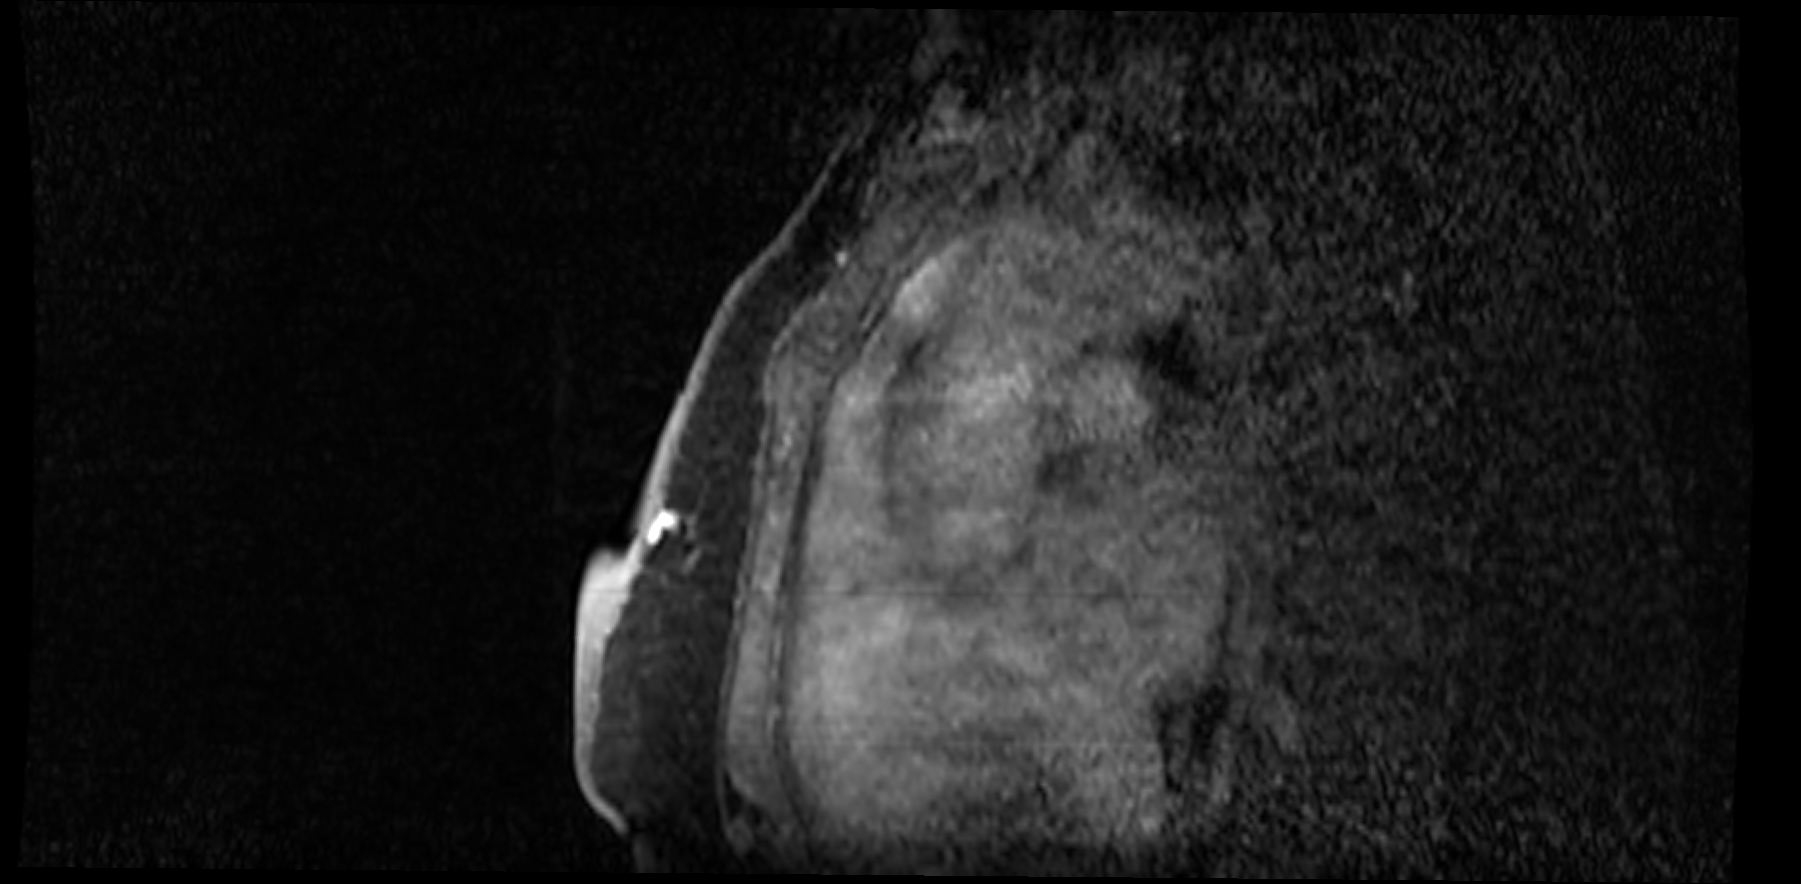

[12 of 41 positions shown; findings below may reference images not displayed]

-
INDICAÇÃO CLÍNICA:
Diagnóstico: Alteração em outro exame de imagem recente, Área não nodular em mama direita em 6horas ,
nódulo antigo em 5 horas da mama direita .
Passado de tratamento conservador de câncer de mama esquerda ( 3 nódulos entre 5 horas e 6 horas).
Paciente em QT adjuvante.
Exames apresentados:
US do dia [DATE] /25 : identificou alteração não nodular em QSM às 2 horas , medindo 7 x 5 x 6 mm , a 2 cm
RESSONÂNCIA MAGNÉTICA DAS MAMAS
da papila e sem correspondência à mamografia realizada na mesma data . Identifica ainda nódulo em junção
dos quadrantes laterais , 9 horas medindo 5 x 3 x4 mm , estável desde 7371. Alterações pós cirúrgicas em
mama esquerda
ASPECTOS TÉCNICOS:
Foram realizadas as seguintes sequências, antes da administração endovenosa de meio de contraste:
- Sequência axial ponderada em T2.
- Sequência de difusão no plano axial com valores de B de 0 e 800.
E após a administração do contraste paramagnético:
- Sequência 3D ponderadas em T1 com supressão de gordura, objetivando o estudo dinâmico da
contrastação mamária. Posteriormente foram realizadas reconstruções tridimensionais.
ASPECTOS OBSERVADOS:
Mama heterogeneamente fibroglandular.
Realce de fundo do parênquima mínimo e simétrico.
Mama direita:
 -
- Não se observam nódulos sólidos, cistos ou áreas de realce anômalo pelo meio de contraste paramagnético.
Mama esquerda:
Cateter porth-a-cath no QSM.
Alterações fibrocicatriciais no quadrante inferior lateral da mama, com distorção arquitetural e a área central
adiposa, sugerindo esteatonecrose . Não se observa realce pelo meio de contraste.
Musculatura e estrutura óssea da parede torácica anterior sem alterações.
Não foram identificadas linfonodopatias nas cadeias axilares e torácicas internas bilateralmente. Estrias
fibroctriciais na região axilar esquerda.
IMPRESSÃO: * Ressonância magnética das mamas evidenciando:
Alterações fibrocicatriciais pós-cirúrgicas na mama esquerda.
- Categoria 2 BI-RADS ACR MRI.
Observação: Na topografia do achado ecográfico relatado em QSM 2 horas não foi identificado realce anômalo
pelo meio de contraste ou restrição da difusão. Basear conduta pelos achados ecográficos.
* Em função de uma situação contingencial o exame foi emitido sem a comparação com imagens anteriores e
sem todas as informações disponíveis para correlação, o que reduz a sua capacidade diagnóstica . A critério
clínico, uma complementação poderá ser solicitada pelo médico assistente .

## 2024-01-25 ENCOUNTER — Other Ambulatory Visit: Payer: Self-pay | Admitting: Family Medicine
